# Patient Record
Sex: Male | Born: 1965 | Race: White | Hispanic: No | Marital: Single | Smoking: Former smoker
Health system: Southern US, Community
[De-identification: ages and names within clinical notes are randomized; demographics above are authoritative.]

## PROBLEM LIST (undated history)

## (undated) DIAGNOSIS — I351 Nonrheumatic aortic (valve) insufficiency: Secondary | ICD-10-CM

## (undated) DIAGNOSIS — F1020 Alcohol dependence, uncomplicated: Secondary | ICD-10-CM

## (undated) DIAGNOSIS — Z87891 Personal history of nicotine dependence: Secondary | ICD-10-CM

## (undated) DIAGNOSIS — I5040 Unspecified combined systolic (congestive) and diastolic (congestive) heart failure: Secondary | ICD-10-CM

## (undated) DIAGNOSIS — I10 Essential (primary) hypertension: Secondary | ICD-10-CM

## (undated) DIAGNOSIS — G4733 Obstructive sleep apnea (adult) (pediatric): Secondary | ICD-10-CM

## (undated) DIAGNOSIS — I472 Ventricular tachycardia, unspecified: Secondary | ICD-10-CM

## (undated) DIAGNOSIS — I4819 Other persistent atrial fibrillation: Secondary | ICD-10-CM

## (undated) HISTORY — DX: Alcohol dependence, uncomplicated: F10.20

## (undated) HISTORY — DX: Personal history of nicotine dependence: Z87.891

## (undated) HISTORY — DX: Other persistent atrial fibrillation: I48.19

## (undated) HISTORY — DX: Ventricular tachycardia, unspecified: I47.20

## (undated) HISTORY — DX: Morbid (severe) obesity due to excess calories: E66.01

## (undated) HISTORY — PX: MOUTH SURGERY: SHX715

## (undated) HISTORY — DX: Unspecified combined systolic (congestive) and diastolic (congestive) heart failure: I50.40

## (undated) HISTORY — DX: Thoracic aortic ectasia: I77.810

## (undated) HISTORY — DX: Nonrheumatic aortic (valve) insufficiency: I35.1

## (undated) HISTORY — DX: Obstructive sleep apnea (adult) (pediatric): G47.33

---
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
## 2005-09-24 ENCOUNTER — Ambulatory Visit: Payer: Self-pay | Admitting: Family Medicine

## 2005-10-12 ENCOUNTER — Ambulatory Visit: Payer: Self-pay | Admitting: Family Medicine

## 2006-01-08 ENCOUNTER — Ambulatory Visit: Payer: Self-pay | Admitting: Family Medicine

## 2006-01-20 ENCOUNTER — Ambulatory Visit: Payer: Self-pay | Admitting: Family Medicine

## 2006-03-22 ENCOUNTER — Ambulatory Visit: Payer: Self-pay | Admitting: Family Medicine

## 2006-06-18 ENCOUNTER — Ambulatory Visit: Payer: Self-pay | Admitting: Family Medicine

## 2006-07-20 DIAGNOSIS — I10 Essential (primary) hypertension: Secondary | ICD-10-CM | POA: Insufficient documentation

## 2006-09-30 ENCOUNTER — Encounter: Payer: Self-pay | Admitting: Family Medicine

## 2007-01-31 ENCOUNTER — Ambulatory Visit: Payer: Self-pay | Admitting: Family Medicine

## 2007-03-10 ENCOUNTER — Telehealth: Payer: Self-pay | Admitting: Family Medicine

## 2007-03-23 ENCOUNTER — Ambulatory Visit: Payer: Self-pay | Admitting: Family Medicine

## 2008-10-08 ENCOUNTER — Telehealth: Payer: Self-pay | Admitting: Family Medicine

## 2008-11-20 ENCOUNTER — Ambulatory Visit: Payer: Self-pay | Admitting: Family Medicine

## 2010-02-13 ENCOUNTER — Ambulatory Visit: Payer: Self-pay | Admitting: Family Medicine

## 2010-11-11 NOTE — Assessment & Plan Note (Signed)
Summary: HTN, Weight   Vital Signs:  Patient profile:   45 year old male Height:      69 inches Weight:      327 pounds BMI:     48.46 Pulse rate:   95 / minute BP sitting:   174 / 106  (left arm) Cuff size:   large  Vitals Entered By: Kathlene November (Feb 13, 2010 8:22 AM) CC: followup BP- been out of meds x2 days, Hypertension Management   Primary Care Provider:  Linford Arnold, C  CC:  followup BP- been out of meds x2 days and Hypertension Management.  History of Present Illness: followup BP- been out of meds x2 days. Weight is up about 40 lbs since January. No regular exercise. NOt interested in surgical options.    Hypertension History:      He denies headache, chest pain, palpitations, dyspnea with exertion, orthopnea, PND, peripheral edema, visual symptoms, neurologic problems, syncope, and side effects from treatment.  Still unhappy with his weight. .        Positive major cardiovascular risk factors include male age 65 years old or older, hyperlipidemia, hypertension, and family history for ischemic heart disease (males less than 42 years old).  Negative major cardiovascular risk factors include no history of diabetes and non-tobacco-user status.        Further assessment for target organ damage reveals no history of ASHD, cardiac end-organ damage (CHF/LVH), stroke/TIA, peripheral vascular disease, renal insufficiency, or hypertensive retinopathy.     Current Medications (verified): 1)  Micardis Hct 80-25 Mg Tabs (Telmisartan-Hctz) .... Take 1 Tablet By Mouth Once A Day  Allergies (verified): No Known Drug Allergies  Social History: Reviewed history from 07/20/2006 and no changes required. 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  Sinlge.  Lives with a friend.  Quit smoking 04/27/99.  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  NO regular exercise.  Physical Exam  General:  Well-developed,well-nourished,in no acute distress;  alert,appropriate and cooperative throughout examination Head:  Normocephalic and atraumatic without obvious abnormalities.  balding. Lungs:  Normal respiratory effort, chest expands symmetrically. Lungs are clear to auscultation, no crackles or wheezes. Heart:  Normal rate and regular rhythm. S1 and S2 normal without gallop, murmur, click, rub or other extra sounds. Skin:  no rashes.   Psych:  Cognition and judgment appear intact. Alert and cooperative with normal attention span and concentration. No apparent delusions, illusions, hallucinations   Impression & Recommendations:  Problem # 1:  HYPERTENSION, BENIGN SYSTEMIC (ICD-401.1) Will change medication for better BP control and for cost reasons.  Due for labs as well f/u for BP recheck in 2 months with Dr. Leonard Schwartz.  His updated medication list for this problem includes:    Tribenzor 40-5-25 Mg Tabs (Olmesartan-amlodipine-hctz) .[name]... Take 1 tablet by mouth once a day  Orders: T-Comprehensive Metabolic Panel [id number]) T-TSH [phone]) T-Lipid Profile ([id number])  Problem # 2:  MORBID OBESITY (ICD-278.01) Disucssed options. Encouraged him to consider working with Dr. Leonard Schwartz on a weight loss program or consider sseing a nutritionist.  He is also not actively exercising.    Complete Medication List: 1)  Tribenzor 40-5-25 Mg Tabs (Olmesartan-amlodipine-hctz) .... Take 1 tablet by mouth once a day  Hypertension Assessment/Plan:      The patient's hypertensive risk group is category B: At least one risk factor (excluding diabetes) with no target organ damage.  His calculated 10 year risk of coronary heart disease is  14 %.  Today's blood pressure is 174/106.  His blood pressure goal is < 140/90.  Patient Instructions: 1)  Change to Tirbenzor once a day.  2)  Please schedule a follow-up appointment in 2 months to recheck BP.  3)  Make sure to fast for 8 hour for your labwork.   Prescriptions: TRIBENZOR 40-5-25 MG TABS  (OLMESARTAN-AMLODIPINE-HCTZ) Take 1 tablet by mouth once a day  #30 x 3   Entered and Authorized by:   Nani Gasser MD   Signed by:   Nani Gasser MD on 02/13/2010   Method used:   Electronically to        [facility] [phone]* (retail)       [address] [place], [place]  [address]       Ph: [phone]       Fax: [phone]   RxID:   [id number]

## 2010-12-22 ENCOUNTER — Ambulatory Visit (INDEPENDENT_AMBULATORY_CARE_PROVIDER_SITE_OTHER): Payer: BC Managed Care – PPO | Admitting: Family Medicine

## 2010-12-22 ENCOUNTER — Encounter: Payer: Self-pay | Admitting: Family Medicine

## 2010-12-22 ENCOUNTER — Ambulatory Visit: Payer: Self-pay | Admitting: Family Medicine

## 2010-12-22 DIAGNOSIS — I10 Essential (primary) hypertension: Secondary | ICD-10-CM

## 2010-12-30 NOTE — Assessment & Plan Note (Signed)
Summary: HTN   Vital Signs:  Patient profile:   45 year old male Height:      69 inches Weight:      233 pounds Pulse rate:   73 / minute BP sitting:   147 / 84  (right arm) Cuff size:   large  Vitals Entered By: Avon Gully CMA, Duncan Dull) (December 22, 2010 1:57 PM) CC: f/u BP   Primary Care Provider:  Linford Arnold, C  CC:  f/u BP.  History of Present Illness: Hasn't had meds since Saturday.  BP  have been running 115/ 50s. He can feel when runs this low.  Can tell when working out [employer] cardio. He has been working out and has lost a sig amt of weight. Has lost 90 lbs since less than a year.  NO CP, SOB.    Current Medications (verified): 1)  Tribenzor 40-5-25 Mg Tabs (Olmesartan-Amlodipine-Hctz) .... Take 1 Tablet By Mouth Once A Day  Allergies (verified): No Known Drug Allergies  Comments:  Nurse/Medical Assistant: The patient's medications and allergies were reviewed with the patient and were updated in the Medication and Allergy Lists. Avon Gully CMA, Duncan Dull) (December 22, 2010 1:58 PM)  Physical Exam  General:  Well-developed,well-nourished,in no acute distress; alert,appropriate and cooperative throughout examination Lungs:  Normal respiratory effort, chest expands symmetrically. Lungs are clear to auscultation, no crackles or wheezes. Heart:  Normal rate and regular rhythm. S1 and S2 normal without gallop, murmur, click, rub or other extra sounds.   Impression & Recommendations:  Problem # 1:  HYPERTENSION, BENIGN SYSTEMIC (ICD-401.1) Much improved. Will d/c amlodipine, cut diuretic in half and change to losartan for cost reason F/U recheck in one month.  Due for fasting labs as well.  His updated medication list for this problem includes:    Losartan Potassium-hctz 100-12.5 Mg Tabs (Losartan potassium-hctz) .[name]... Take 1 tablet by mouth once a day  BP today: 147/84 Prior BP: 174/106 (02/13/2010)  Prior 10 Yr Risk Heart Disease: 14 % (02/13/2010)  Labs  Reviewed: Chol: 204 (10/09/2005)   HDL: 40 (10/09/2005)   LDL: 129 (10/09/2005)   TG: 175 (10/09/2005)  Orders: T-Comprehensive Metabolic Panel ([id number]) T-Lipid Profile ([id number])  Complete Medication List: 1)  Losartan Potassium-hctz 100-12.5 Mg Tabs (Losartan potassium-hctz) .... Take 1 tablet by mouth once a day  Patient Instructions: 1)  Please schedule a follow-up appointment in 1 month for blood pressure check.  jnPrescriptions: LOSARTAN POTASSIUM-HCTZ 100-12.5 MG TABS (LOSARTAN POTASSIUM-HCTZ) Take 1 tablet by mouth once a day  #30 x 1   Entered and Authorized by:   Nani Gasser MD   Signed by:   Nani Gasser MD on 12/22/2010   Method used:   Electronically to        [facility] [phone]* (retail)       [address] [place], [place]  [address]       Ph: [phone]       Fax: [phone]   RxID:   [id number]    Orders Added: 1)  T-Comprehensive Metabolic Panel [[id number]] 2)  T-Lipid Profile [[id number]] 3)  Est. Patient Level III [25956]

## 2011-01-13 ENCOUNTER — Encounter: Payer: Self-pay | Admitting: Family Medicine

## 2011-02-18 ENCOUNTER — Telehealth: Payer: Self-pay | Admitting: Family Medicine

## 2011-02-18 NOTE — Telephone Encounter (Signed)
Pt called to inquire about how many hours to fast for fasting labs tomorrow?? Plan:  Pt instructed to allow 8-12 hours for fasting cholesterol panel he is having drawn. Jarvis Newcomer, LPN Domingo Dimes

## 2011-02-19 ENCOUNTER — Other Ambulatory Visit: Payer: Self-pay | Admitting: Family Medicine

## 2011-02-20 ENCOUNTER — Telehealth: Payer: Self-pay | Admitting: Family Medicine

## 2011-02-20 LAB — LIPID PANEL
Cholesterol: 192 mg/dL (ref 0–200)
HDL: 44 mg/dL (ref >39)
Triglycerides: 65 mg/dL (ref <150)

## 2011-02-20 LAB — COMPREHENSIVE METABOLIC PANEL
Albumin: 4.7 g/dL (ref 3.5–5.2)
BUN: 20 mg/dL (ref 6–23)
Calcium: 9.9 mg/dL (ref 8.4–10.5)
Chloride: 101 mEq/L (ref 96–112)
Glucose, Bld: 66 mg/dL — ABNORMAL LOW (ref 70–99)
Potassium: 5 mEq/L (ref 3.5–5.3)

## 2011-02-20 NOTE — Telephone Encounter (Signed)
Call patient: Labs look okay except for LDL still up a little bit is 135. Please continue to work on exercise and diet and we can recheck in one year.

## 2011-02-21 ENCOUNTER — Encounter: Payer: Self-pay | Admitting: Family Medicine

## 2011-02-23 ENCOUNTER — Ambulatory Visit (INDEPENDENT_AMBULATORY_CARE_PROVIDER_SITE_OTHER): Payer: BC Managed Care – PPO | Admitting: Family Medicine

## 2011-02-23 ENCOUNTER — Encounter: Payer: Self-pay | Admitting: Family Medicine

## 2011-02-23 DIAGNOSIS — I10 Essential (primary) hypertension: Secondary | ICD-10-CM

## 2011-02-23 NOTE — Telephone Encounter (Signed)
Pt.notified

## 2011-02-23 NOTE — Assessment & Plan Note (Signed)
At goal.  Continue current regimen. F/U in 4 months since he is continuing to work on losing weight. Hopefully BPs will come down even more and we can reduce his medication.

## 2011-02-23 NOTE — Progress Notes (Signed)
  Subjective:    Patient ID: Joe Morrison, male    DOB: 09-22-1966, 45 y.o.   MRN: [id number]  HPI Home BPs 132/74 over the last 2 months.  We had dropped his amlodipine at the last visit. He is still working out regularly and has lost about another 16 lbs per our scales. He is doing cardio daily. Taking his meds reguarly.  Repeat BP here was 132/85.  No CP, SOB with exercise.  His goal is to be under 200 lb at next OV and to work to get off his BP meds.    Review of Systems     Objective:   Physical Exam  Constitutional: He is oriented to person, place, and time. He appears well-developed and well-nourished.  HENT:  Head: Normocephalic and atraumatic.  Eyes: Conjunctivae are normal. Pupils are equal, round, and reactive to light.  Neck: Neck supple. No thyromegaly present.  Cardiovascular: Normal rate, regular rhythm and normal heart sounds.   Pulmonary/Chest: Effort normal and breath sounds normal.  Lymphadenopathy:    He has no cervical adenopathy.  Neurological: He is alert and oriented to person, place, and time.  Skin: Skin is warm and dry.  Psychiatric: He has a normal mood and affect.          Assessment & Plan:

## 2011-04-14 ENCOUNTER — Encounter: Payer: Self-pay | Admitting: Family Medicine

## 2011-04-14 ENCOUNTER — Ambulatory Visit (INDEPENDENT_AMBULATORY_CARE_PROVIDER_SITE_OTHER): Payer: Commercial Managed Care - PPO | Admitting: Family Medicine

## 2011-04-14 VITALS — BP 144/88 | HR 93 | Ht 69.0 in | Wt 207.0 lb

## 2011-04-14 DIAGNOSIS — F5221 Male erectile disorder: Secondary | ICD-10-CM

## 2011-04-14 DIAGNOSIS — F528 Other sexual dysfunction not due to a substance or known physiological condition: Secondary | ICD-10-CM

## 2011-04-14 DIAGNOSIS — I10 Essential (primary) hypertension: Secondary | ICD-10-CM

## 2011-04-14 MED ORDER — LOSARTAN POTASSIUM-HCTZ 100-25 MG PO TABS: 1.0000 | ORAL_TABLET | Freq: Every day | ORAL | Status: DC

## 2011-04-14 MED ORDER — VARDENAFIL HCL 10 MG PO TABS: 10.0000 mg | ORAL_TABLET | ORAL | Status: DC | PRN

## 2011-04-14 NOTE — Patient Instructions (Addendum)
Can go online and see if a coupon on the levitra.  We are increasing your BP med. Recheck in 2-3 months.

## 2011-04-14 NOTE — Assessment & Plan Note (Signed)
BP up a little today so will inc diuretic.  F/u in 2-3 months. Congrats on wt loss.

## 2011-04-14 NOTE — Progress Notes (Signed)
  Subjective:    Patient ID: Joe Morrison, male    DOB: [birth year], 45 y.o.   MRN: [id number]  Hypertension This is a chronic problem. The current episode started more than 1 year ago. The problem is unchanged. The problem is controlled. Pertinent negatives include no chest pain or shortness of breath. There are no associated agents to hypertension. Past treatments include ACE inhibitors and diuretics. The current treatment provides mild improvement. There are no compliance problems.   Average 130/70 at home. Has lost 7 more lbs. He is still working out regularly.   Having ED problems.  Not sure if pyschological but says does wake up with erections.  Says does have anxiety about being undressed. He has a lot of body image and securities.  Now in a new relationship and may become sexual active.  He is concerned this may affect his new relationship. No worsening or alleviating symptoms. No history of prior problems with erectile dysfunction.   Review of Systems  Respiratory: Negative for shortness of breath.   Cardiovascular: Negative for chest pain.       Objective:   Physical Exam  Constitutional: He is oriented to person, place, and time. He appears well-developed and well-nourished.  HENT:  Head: Normocephalic and atraumatic.  Cardiovascular: Normal rate, regular rhythm and normal heart sounds.   Pulmonary/Chest: Effort normal and breath sounds normal.  Musculoskeletal: He exhibits no edema.  Neurological: He is alert and oriented to person, place, and time.  Skin: Skin is warm and dry.  Psychiatric: He has a normal mood and affect. His behavior is normal.          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  Completed questionnaire.  He scored 11 which is significant for a cognitive function. I also gave him a testosterone questionnaire and he only answered yes to 2 questions. Thus it is unlikely that he has low testosterone. Discussed trial of ED med to help boost his  confidence and then wean.  Has a prescription for Levitra. He can check on her coupon cards.

## 2011-04-21 ENCOUNTER — Other Ambulatory Visit: Payer: Self-pay | Admitting: Family Medicine

## 2011-04-21 NOTE — Telephone Encounter (Signed)
rx called to pharmacy 

## 2011-05-11 ENCOUNTER — Other Ambulatory Visit: Payer: Self-pay | Admitting: Family Medicine

## 2011-05-11 NOTE — Telephone Encounter (Signed)
Pt called and would like to get some samples of levitra. Plan:  Called and LMOM for the pt to return the call to triage nurse and needs to speak with her. Jarvis Newcomer, LPN [facility] Dimes

## 2011-05-11 NOTE — Telephone Encounter (Signed)
Pt's call returned at the number he requested to be called at, but had to [facility]  Told pt to call me back at his convenience. Jarvis Newcomer, LPN Domingo Dimes

## 2011-05-12 ENCOUNTER — Other Ambulatory Visit: Payer: Self-pay | Admitting: Family Medicine

## 2011-05-12 MED ORDER — VARDENAFIL HCL 10 MG PO TABS: 10.0000 mg | ORAL_TABLET | ORAL | Status: DC | PRN

## 2011-05-14 ENCOUNTER — Other Ambulatory Visit: Payer: Self-pay | Admitting: Family Medicine

## 2011-05-14 NOTE — Telephone Encounter (Signed)
No response from pt to this point.  Pt told to call back at his convenience.  Will close encounter for now and pt will call back when ready.  Samples were pulled and place up front and pt was handled on a diff mess encounter. Jarvis Newcomer, LPN Domingo Dimes

## 2011-05-14 NOTE — Telephone Encounter (Signed)
Closed encounter °

## 2011-05-26 ENCOUNTER — Other Ambulatory Visit: Payer: Self-pay | Admitting: Family Medicine

## 2011-06-09 ENCOUNTER — Telehealth: Payer: Self-pay | Admitting: Family Medicine

## 2011-06-09 MED ORDER — TADALAFIL 5 MG PO TABS: 5.0000 mg | ORAL_TABLET | Freq: Every day | ORAL | Status: DC | PRN

## 2011-06-09 NOTE — Telephone Encounter (Signed)
Pt called and said he would like to try the cialis all day coverage since he and his wife are sexually active at current.  His other script is only written for # 5 pills for 30 day supply and he is getting ready to fill the last refill of that.  Printed a coupon off line for cialis 5 mg all day coverage for 30 day free trial Will need a hard script to go with the coupon.  Please advise.   PLAN:  Routed to Dr. Marlyne Beards, LPN [facility] Dimes

## 2011-06-09 NOTE — Telephone Encounter (Signed)
Printed rx  

## 2011-06-09 NOTE — Telephone Encounter (Signed)
Pt informed that he could pup his script for cialis 5 mg for 30 day supply.  LMOM for his private cellular. Jarvis Newcomer, LPN Domingo Dimes

## 2011-06-29 ENCOUNTER — Telehealth: Payer: Self-pay | Admitting: Family Medicine

## 2011-06-29 NOTE — Telephone Encounter (Signed)
Pt called and said he had been using cialis and he had a coupon, but unfortunately for daily use it is not working as well as the levitra, but wants to know if the cialis comes in a higher dose besides the 5mg .  If there is not a higher dose of the cialis pt prefers to go back to using the levitra.  Please advise. Plan:  Routed to Dr.Metheney Jarvis Newcomer, LPN [facility] Dimes

## 2011-06-30 ENCOUNTER — Telehealth: Payer: Self-pay | Admitting: Family Medicine

## 2011-06-30 MED ORDER — VARDENAFIL HCL 20 MG PO TABS: 20.0000 mg | ORAL_TABLET | ORAL | Status: DC | PRN

## 2011-06-30 NOTE — Telephone Encounter (Signed)
Rx sent for levitra.

## 2011-06-30 NOTE — Telephone Encounter (Signed)
There is not a higher daily dose of the Cialis. Depending on how long he has tried it, he does need to take it for at least a week or 2 to get the full effect of the daily dosing. If he would like to go ahead and switch back to the Levitra we can. He will have to remind me what dose he was using on the Levitra.

## 2011-06-30 NOTE — Telephone Encounter (Signed)
Pt had tried the cialis for a full 3 weeks and waited before he called.  Wants to go back to using cialis, and he was last on the 20 mg.  Please fill more that 5 at a time because pt said he is mostly paying out of pocket anyway.  Correct pharm is loaded in the system. Plan:  Routed to Dr. Marlyne Beards, LPN [facility] Dimes

## 2011-07-01 NOTE — Telephone Encounter (Signed)
Closed

## 2011-08-19 ENCOUNTER — Other Ambulatory Visit: Payer: Self-pay | Admitting: Family Medicine

## 2011-09-23 ENCOUNTER — Other Ambulatory Visit: Payer: Self-pay | Admitting: Family Medicine

## 2011-09-23 MED ORDER — LOSARTAN POTASSIUM-HCTZ 100-12.5 MG PO TABS: 1.0000 | ORAL_TABLET | Freq: Every day | ORAL | Status: DC

## 2011-11-20 ENCOUNTER — Other Ambulatory Visit: Payer: Self-pay | Admitting: *Deleted

## 2011-11-20 MED ORDER — LOSARTAN POTASSIUM-HCTZ 100-25 MG PO TABS: 1.0000 | ORAL_TABLET | Freq: Every day | ORAL | Status: DC

## 2011-12-04 ENCOUNTER — Encounter: Payer: Self-pay | Admitting: *Deleted

## 2011-12-11 ENCOUNTER — Ambulatory Visit (INDEPENDENT_AMBULATORY_CARE_PROVIDER_SITE_OTHER): Payer: Commercial Managed Care - PPO | Admitting: Family Medicine

## 2011-12-11 ENCOUNTER — Encounter: Payer: Self-pay | Admitting: Family Medicine

## 2011-12-11 DIAGNOSIS — I10 Essential (primary) hypertension: Secondary | ICD-10-CM

## 2011-12-11 MED ORDER — LOSARTAN POTASSIUM-HCTZ 100-12.5 MG PO TABS: 1.0000 | ORAL_TABLET | Freq: Every day | ORAL | Status: DC

## 2011-12-11 NOTE — Progress Notes (Signed)
  Subjective:    Patient ID: Joe Morrison, male    DOB: Mar 07, 1966, 46 y.o.   MRN: [id number]  HPI HTN - Says gained weight over the holidays and hasn't been exercising and says  Has gottten back on trackt the last 3 weeks.  Tolerating it well. No SE. No CP or SOB.    Review of Systems     Objective:   Physical Exam  Constitutional: He is oriented to person, place, and time. He appears well-developed and well-nourished.  HENT:  Head: Normocephalic and atraumatic.  Cardiovascular: Normal rate, regular rhythm and normal heart sounds.   Pulmonary/Chest: Effort normal and breath sounds normal.  Neurological: He is alert and oriented to person, place, and time.  Skin: Skin is warm and dry.  Psychiatric: He has a normal mood and affect. His behavior is normal.          Assessment & Plan:  HTN- repeat BP is well controlled.  F/U in 4-6 months.   Obesity - He is getting back on track the last few weeks.

## 2012-03-21 ENCOUNTER — Encounter: Payer: Commercial Managed Care - PPO | Admitting: Family Medicine

## 2012-04-12 ENCOUNTER — Encounter: Payer: Commercial Managed Care - PPO | Admitting: Family Medicine

## 2012-04-22 LAB — LIPID PANEL
Cholesterol: 186 mg/dL (ref 0–200)
Triglycerides: 73 mg/dL (ref 40–160)

## 2012-05-02 ENCOUNTER — Ambulatory Visit (INDEPENDENT_AMBULATORY_CARE_PROVIDER_SITE_OTHER): Payer: Commercial Managed Care - PPO | Admitting: Family Medicine

## 2012-05-02 ENCOUNTER — Telehealth: Payer: Self-pay | Admitting: Family Medicine

## 2012-05-02 ENCOUNTER — Encounter: Payer: Self-pay | Admitting: Family Medicine

## 2012-05-02 VITALS — BP 131/76 | HR 83 | Ht 68.0 in | Wt 215.0 lb

## 2012-05-02 DIAGNOSIS — Z0000 Encounter for general adult medical examination without abnormal findings: Secondary | ICD-10-CM

## 2012-05-02 DIAGNOSIS — I10 Essential (primary) hypertension: Secondary | ICD-10-CM

## 2012-05-02 DIAGNOSIS — N529 Male erectile dysfunction, unspecified: Secondary | ICD-10-CM

## 2012-05-02 MED ORDER — VARDENAFIL HCL 20 MG PO TABS: 20.0000 mg | ORAL_TABLET | ORAL | Status: DC | PRN

## 2012-05-02 NOTE — Telephone Encounter (Signed)
Call pt: I did receive the fax copy of his blood work. Overall cholesterol looks good. His LDL is still slightly elevated. Normal is under 100. His was 125 but is better than it was last year. Keep up the good work and we can recheck in one year. I do need to get a complete metabolic panel with liver and kidney function as this was not included. I will print a  lab slip for CMP and he can go anytime. He does not need to fast for this.

## 2012-05-02 NOTE — Patient Instructions (Addendum)
Start a regular exercise program and make sure you are eating a healthy diet Try to eat 4 servings of dairy  Your vaccines are up to date.  Please fax me a copy of your lab work [phone].

## 2012-05-02 NOTE — Telephone Encounter (Signed)
Pt informed and will go today.

## 2012-05-02 NOTE — Progress Notes (Signed)
Subjective:    Patient ID: Joe Morrison, male    DOB: 1966/05/12, 46 y.o.   MRN: [id number]  HPI Here for CPE. He recently had a biometrics screening at work and they had blood work done there. He reports his cholesterol looks fantastic. He says they will be happy to fax [place] a copy.   Review of Systems comprehenive ROS is negative  BP 131/76  Pulse 83  Ht 5\' 8"  (1.727 m)  Wt 215 lb (97.523 kg)  BMI 32.69 kg/m2  SpO2 99%    No Known Allergies  Past Medical History  Diagnosis Date  . Alcoholic     recovering since 12/863    Past Surgical History  Procedure Date  . Mouth surgery     Dental work  . Tonsillectomy 1972    History   Social History  . Marital Status: Single    Spouse Name: N/A    Number of Children: 0  . Years of Education: N/A   Occupational History  . Not on file.   Social History Main Topics  . Smoking status: Former Smoker    Quit date: 04/27/1999  . Smokeless tobacco: Not on file  . Alcohol Use: No     Former Advice worker in [place]  . Drug Use: No  . Sexually Active: Not on file   Other Topics Concern  . Not on file   Social History Narrative   Working out daily with a Psychologist, educational. No partner    Family History  Problem Relation Age of Onset  . Diabetes Mother   . Hypertension Mother   . Heart attack Father 67  . Alcohol abuse Other     Outpatient Encounter Prescriptions as of 05/02/2012  Medication Sig Dispense Refill  . losartan-hydrochlorothiazide (HYZAAR) 100-12.5 MG per tablet Take 1 tablet by mouth daily.  30 tablet  6  . vardenafil (LEVITRA) 20 MG tablet Take 1 tablet (20 mg total) by mouth as needed for erectile dysfunction.  10 tablet  4          Objective:   Physical Exam  Constitutional: He is oriented to person, place, and time. He appears well-developed and well-nourished.       He is obese.   HENT:  Head: Normocephalic and atraumatic.  Right Ear: External ear normal.  Left Ear: External ear normal.  Nose:  Nose normal.  Mouth/Throat: Oropharynx is clear and moist.  Eyes: Conjunctivae and EOM are normal. Pupils are equal, round, and reactive to light.  Neck: Normal range of motion. Neck supple. No thyromegaly present.  Cardiovascular: Normal rate, regular rhythm, normal heart sounds and intact distal pulses.   Pulmonary/Chest: Effort normal and breath sounds normal.  Abdominal: Soft. Bowel sounds are normal. He exhibits no distension and no mass. There is no tenderness. There is no rebound and no guarding.  Musculoskeletal: Normal range of motion.  Lymphadenopathy:    He has no cervical adenopathy.  Neurological: He is alert and oriented to person, place, and time. He has normal reflexes.  Skin: Skin is warm and dry.  Psychiatric: He has a normal mood and affect. His behavior is normal. Judgment and thought content normal.          Assessment & Plan:  CPE-  Start a regular exercise program and make sure you are eating a healthy diet Try to eat 4 servings of dairy a day  Your vaccines are up to date.   HTN- doing veyr well. No CP or SOB.  F/U in 6 months  ED- REfills sent to pharmacy.

## 2012-05-03 ENCOUNTER — Encounter: Payer: Self-pay | Admitting: *Deleted

## 2012-07-05 ENCOUNTER — Other Ambulatory Visit: Payer: Self-pay | Admitting: Family Medicine

## 2012-08-31 ENCOUNTER — Other Ambulatory Visit: Payer: Self-pay | Admitting: Family Medicine

## 2012-11-01 ENCOUNTER — Ambulatory Visit (INDEPENDENT_AMBULATORY_CARE_PROVIDER_SITE_OTHER): Payer: Commercial Managed Care - PPO | Admitting: Family Medicine

## 2012-11-01 ENCOUNTER — Encounter: Payer: Self-pay | Admitting: Family Medicine

## 2012-11-01 VITALS — BP 136/82 | HR 86 | Resp 18 | Wt 216.0 lb

## 2012-11-01 DIAGNOSIS — I10 Essential (primary) hypertension: Secondary | ICD-10-CM

## 2012-11-01 MED ORDER — LOSARTAN POTASSIUM-HCTZ 100-12.5 MG PO TABS: 1.0000 | ORAL_TABLET | Freq: Every day | ORAL | Status: DC

## 2012-11-01 NOTE — Progress Notes (Signed)
  Subjective:    Patient ID: Joe Morrison, male    DOB: 1966-06-03, 47 y.o.   MRN: [id number]  HPI HTN - Home BPs runnin 115-130.   Pt denies chest pain, SOB, dizziness, or heart palpitations.  Taking meds as directed w/o problems.  Denies medication side effects.  Still working out regularly.     Review of Systems     Objective:   Physical Exam  Constitutional: He is oriented to person, place, and time. He appears well-developed and well-nourished.  HENT:  Head: Normocephalic and atraumatic.  Cardiovascular: Normal rate, regular rhythm and normal heart sounds.   Pulmonary/Chest: Effort normal and breath sounds normal.  Neurological: He is alert and oriented to person, place, and time.  Skin: Skin is warm and dry.  Psychiatric: He has a normal mood and affect. His behavior is normal.          Assessment & Plan:  HTN- Well controlled. Due for CMP.  Home BPS sounds fantastic. F/u in 6 mo.

## 2012-11-02 LAB — COMPLETE METABOLIC PANEL WITH GFR
Albumin: 4.3 g/dL (ref 3.5–5.2)
Alkaline Phosphatase: 31 U/L — ABNORMAL LOW (ref 39–117)
BUN: 11 mg/dL (ref 6–23)
GFR, Est Non African American: 83 mL/min
Glucose, Bld: 80 mg/dL (ref 70–99)
Potassium: 4.4 mEq/L (ref 3.5–5.3)
Total Bilirubin: 0.8 mg/dL (ref 0.3–1.2)

## 2012-11-02 NOTE — Progress Notes (Signed)
Quick Note:  All labs are normal. ______ 

## 2013-05-01 ENCOUNTER — Ambulatory Visit: Payer: Commercial Managed Care - PPO | Admitting: Family Medicine

## 2013-05-01 DIAGNOSIS — Z0289 Encounter for other administrative examinations: Secondary | ICD-10-CM

## 2013-05-20 ENCOUNTER — Other Ambulatory Visit: Payer: Self-pay | Admitting: Family Medicine

## 2013-06-02 ENCOUNTER — Other Ambulatory Visit: Payer: Self-pay | Admitting: Family Medicine

## 2013-07-07 ENCOUNTER — Other Ambulatory Visit: Payer: Self-pay

## 2013-07-07 MED ORDER — LOSARTAN POTASSIUM-HCTZ 100-12.5 MG PO TABS: 1.0000 | ORAL_TABLET | Freq: Every day | ORAL | Status: DC

## 2013-07-07 NOTE — Telephone Encounter (Signed)
Refilled blood pressure medication and scheduled patient to come in for visit.

## 2013-07-12 ENCOUNTER — Ambulatory Visit: Payer: Commercial Managed Care - PPO | Admitting: Family Medicine

## 2013-08-02 ENCOUNTER — Ambulatory Visit: Payer: Commercial Managed Care - PPO | Admitting: Family Medicine

## 2013-08-04 ENCOUNTER — Other Ambulatory Visit: Payer: Self-pay

## 2013-08-04 MED ORDER — LOSARTAN POTASSIUM-HCTZ 100-12.5 MG PO TABS: 1.0000 | ORAL_TABLET | Freq: Every day | ORAL | Status: DC

## 2013-08-11 ENCOUNTER — Ambulatory Visit: Payer: Commercial Managed Care - PPO | Admitting: Family Medicine

## 2013-08-23 ENCOUNTER — Encounter: Payer: Commercial Managed Care - PPO | Admitting: Family Medicine

## 2013-08-23 DIAGNOSIS — Z0289 Encounter for other administrative examinations: Secondary | ICD-10-CM

## 2013-08-24 NOTE — Progress Notes (Signed)
  Subjective:    Patient ID: Joe Morrison, male    DOB: 11-22-1965, 47 y.o.   MRN: [id number]  HPI Patient no showed for appointment.   Review of Systems     Objective:   Physical Exam        Assessment & Plan:

## 2013-09-13 ENCOUNTER — Telehealth: Payer: Self-pay | Admitting: Family Medicine

## 2013-09-13 ENCOUNTER — Other Ambulatory Visit: Payer: Self-pay | Admitting: Family Medicine

## 2013-09-13 MED ORDER — LOSARTAN POTASSIUM-HCTZ 100-12.5 MG PO TABS: ORAL_TABLET | ORAL | Status: DC

## 2013-09-13 NOTE — Telephone Encounter (Signed)
Patient called and request to have bp med refilled he took his last pill today and called Monday 09/11/13 he has an appointment this Friday 09/15/13. Thanks

## 2013-09-15 ENCOUNTER — Ambulatory Visit: Payer: Commercial Managed Care - PPO | Admitting: Family Medicine

## 2013-09-15 DIAGNOSIS — Z0289 Encounter for other administrative examinations: Secondary | ICD-10-CM

## 2013-10-13 ENCOUNTER — Other Ambulatory Visit: Payer: Self-pay | Admitting: *Deleted

## 2013-10-13 MED ORDER — LOSARTAN POTASSIUM-HCTZ 100-12.5 MG PO TABS: ORAL_TABLET | ORAL | Status: DC

## 2013-10-13 NOTE — Telephone Encounter (Signed)
Pt informed we are sending 2 weeks worth of BP medication to pharmacy. Has appt scheduled for 10/16/13.  Meyer CoryMisty Ahmad, LPN

## 2013-10-16 ENCOUNTER — Ambulatory Visit: Payer: Commercial Managed Care - PPO | Admitting: Family Medicine

## 2013-10-20 ENCOUNTER — Ambulatory Visit: Payer: Commercial Managed Care - PPO | Admitting: Family Medicine

## 2013-10-23 ENCOUNTER — Ambulatory Visit (INDEPENDENT_AMBULATORY_CARE_PROVIDER_SITE_OTHER): Payer: Commercial Managed Care - PPO | Admitting: Family Medicine

## 2013-10-23 ENCOUNTER — Encounter: Payer: Self-pay | Admitting: Family Medicine

## 2013-10-23 VITALS — BP 185/102 | HR 110 | Temp 98.0°F | Ht 69.0 in | Wt 298.0 lb

## 2013-10-23 DIAGNOSIS — Z6841 Body Mass Index (BMI) 40.0 and over, adult: Secondary | ICD-10-CM

## 2013-10-23 DIAGNOSIS — I10 Essential (primary) hypertension: Secondary | ICD-10-CM

## 2013-10-23 MED ORDER — LOSARTAN POTASSIUM-HCTZ 100-12.5 MG PO TABS: ORAL_TABLET | ORAL | Status: DC

## 2013-10-23 NOTE — Progress Notes (Signed)
   Subjective:    Patient ID: Joe Morrison, male    DOB: 02/25/1966, 48 y.o.   MRN: [id number]  HPI Hypertension- Pt denies chest pain, SOB, dizziness, or heart palpitations.  Taking meds as directed w/o problems.  Denies medication side effects.  Ran out of meds last Monday.  Hasn't been working out. Has fallen off hid diet and exercise.  Home BPs running 130s.     Review of Systems     Objective:   Physical Exam  Constitutional: He is oriented to person, place, and time. He appears well-developed and well-nourished.  HENT:  Head: Normocephalic and atraumatic.  Cardiovascular: Normal rate, regular rhythm and normal heart sounds.   Pulmonary/Chest: Effort normal and breath sounds normal.  Neurological: He is alert and oriented to person, place, and time.  Skin: Skin is warm and dry.  Psychiatric: He has a normal mood and affect. His behavior is normal.          Assessment & Plan:  HTN- Uncontrolled off of meds. Restart BP meds. F/U in 1-2 months.    Obesity/BMI 44 - continue to work on diet and exercise.  Discussed options.  Offered to refer to Dr. Seymour BarsKaren Bowen or consider phentermine when BP under control for appetitie supression.

## 2013-12-04 ENCOUNTER — Ambulatory Visit: Payer: Commercial Managed Care - PPO | Admitting: Family Medicine

## 2013-12-18 ENCOUNTER — Ambulatory Visit: Payer: Commercial Managed Care - PPO | Admitting: Family Medicine

## 2013-12-26 ENCOUNTER — Ambulatory Visit: Payer: Commercial Managed Care - PPO | Admitting: Family Medicine

## 2013-12-26 DIAGNOSIS — Z0289 Encounter for other administrative examinations: Secondary | ICD-10-CM

## 2014-07-26 ENCOUNTER — Other Ambulatory Visit: Payer: Self-pay | Admitting: Family Medicine

## 2014-08-24 ENCOUNTER — Other Ambulatory Visit: Payer: Self-pay | Admitting: Family Medicine

## 2014-08-24 NOTE — Telephone Encounter (Signed)
Spoke with pt and he was told that he would need a f/u appt. He stated that he thinks that he may have some time next week however could not make the appt right now because he was driving but he would call back on Monday to schedule an appt. I stressed to him that it is imperative for him to make and keep this appt due to needing to have labwork done. I told him that I would send in his medication but this would NOT be refilled again until he is seen . He voiced understanding and agreed.Heath GoldBarkley, [place] Dulay Lynetta'

## 2014-08-26 ENCOUNTER — Other Ambulatory Visit: Payer: Self-pay | Admitting: Family Medicine

## 2014-09-26 ENCOUNTER — Encounter: Payer: Self-pay | Admitting: Family Medicine

## 2014-09-26 ENCOUNTER — Ambulatory Visit (INDEPENDENT_AMBULATORY_CARE_PROVIDER_SITE_OTHER): Payer: Commercial Managed Care - PPO | Admitting: Family Medicine

## 2014-09-26 VITALS — BP 166/89 | HR 109 | Temp 97.4°F | Ht 69.0 in | Wt 306.0 lb

## 2014-09-26 DIAGNOSIS — Z125 Encounter for screening for malignant neoplasm of prostate: Secondary | ICD-10-CM

## 2014-09-26 DIAGNOSIS — I10 Essential (primary) hypertension: Secondary | ICD-10-CM

## 2014-09-26 DIAGNOSIS — Z13220 Encounter for screening for lipoid disorders: Secondary | ICD-10-CM

## 2014-09-26 DIAGNOSIS — Z1211 Encounter for screening for malignant neoplasm of colon: Secondary | ICD-10-CM

## 2014-09-26 MED ORDER — LOSARTAN POTASSIUM-HCTZ 100-12.5 MG PO TABS: 1.0000 | ORAL_TABLET | Freq: Every day | ORAL | Status: DC

## 2014-09-26 NOTE — Progress Notes (Signed)
   Subjective:    Patient ID: Joe Morrison, male    DOB: August 25, 1966, 48 y.o.   MRN: [id number]  HPI Follow-up hypertension-he was last seen in January of this year. He had been off of his blood pressure medications. We restarted them. He was post follow-up in 1-2 months and we can make sure his blood pressure looks good and that we do need to adjust medication. He is now following up. He was only given 6 months with the medication at that time. He denies any chest pain, short of breath or palpitations today. Home BPs running in the 12-135/80-90 range.  Has gained some weight.    Obesity-we also discussed referring him to Dr. Sharol RousselKaren Bowen's office for bariatric evaluation and treatment. He says he's really not interested in this time. Also discussed medications as a possibility and again he says he just feels like he needs to get back on track. He's been very successful his job but has been working very long hours and not really taking time to work out exercise and eat properly.   Review of Systems     Objective:   Physical Exam  Constitutional: He is oriented to person, place, and time. He appears well-developed and well-nourished.  HENT:  Head: Normocephalic and atraumatic.  Cardiovascular: Normal rate, regular rhythm and normal heart sounds.   Pulmonary/Chest: Effort normal and breath sounds normal.  Neurological: He is alert and oriented to person, place, and time.  Skin: Skin is warm and dry.  Psychiatric: He has a normal mood and affect. His behavior is normal.          Assessment & Plan:  Hypertension-  Uncontrolled.  He's been out of his medication for about 3 days at this point. We'll go ahead and refill the medication. Did ask him to come back in a couple months to recheck his pressure. It does sound like his home blood pressures have been well controlled. He is due for blood work. Lab slip provided today. We'll also fax over some labs that he had done at work. Next  Severe  obesity/BMI greater than 40-discussed options including referral to bariatric center and medication but he really wants to do diet and exercise on his own. Next  Discussed need for colonoscopy. He is okay with referral. Replaced today.

## 2014-12-03 ENCOUNTER — Ambulatory Visit: Payer: Commercial Managed Care - PPO | Admitting: Family Medicine

## 2014-12-13 ENCOUNTER — Ambulatory Visit: Payer: Commercial Managed Care - PPO | Admitting: Family Medicine

## 2014-12-26 ENCOUNTER — Ambulatory Visit: Payer: Commercial Managed Care - PPO | Admitting: Family Medicine

## 2014-12-27 ENCOUNTER — Ambulatory Visit: Payer: Commercial Managed Care - PPO | Admitting: Family Medicine

## 2015-04-17 ENCOUNTER — Other Ambulatory Visit: Payer: Self-pay | Admitting: Family Medicine

## 2015-05-24 ENCOUNTER — Other Ambulatory Visit: Payer: Self-pay | Admitting: Family Medicine

## 2015-05-24 ENCOUNTER — Other Ambulatory Visit: Payer: Self-pay | Admitting: *Deleted

## 2015-05-24 ENCOUNTER — Telehealth: Payer: Self-pay | Admitting: *Deleted

## 2015-05-24 DIAGNOSIS — I10 Essential (primary) hypertension: Secondary | ICD-10-CM

## 2015-05-24 MED ORDER — LOSARTAN POTASSIUM-HCTZ 100-12.5 MG PO TABS: 1.0000 | ORAL_TABLET | Freq: Every day | ORAL | Status: DC

## 2015-05-24 NOTE — Telephone Encounter (Signed)
Pt called requesting refill for bp meds.advised that he will need a f/u appt to get refills. Pt voiced understanding and agreed. Transferred to scheduling to make appt. rx for bp med sent.Joe KitchenMarland KitchenLoralee Pacas Jackson

## 2015-06-18 ENCOUNTER — Encounter: Payer: Self-pay | Admitting: Family Medicine

## 2015-06-18 ENCOUNTER — Ambulatory Visit (INDEPENDENT_AMBULATORY_CARE_PROVIDER_SITE_OTHER): Payer: Commercial Managed Care - PPO | Admitting: Family Medicine

## 2015-06-18 VITALS — BP 138/100 | HR 98 | Ht 69.0 in | Wt 287.0 lb

## 2015-06-18 DIAGNOSIS — I10 Essential (primary) hypertension: Secondary | ICD-10-CM

## 2015-06-18 MED ORDER — LOSARTAN POTASSIUM-HCTZ 100-12.5 MG PO TABS: 1.0000 | ORAL_TABLET | Freq: Every day | ORAL | Status: DC

## 2015-06-18 MED ORDER — AMLODIPINE BESYLATE 5 MG PO TABS: 5.0000 mg | ORAL_TABLET | Freq: Every day | ORAL | Status: DC

## 2015-06-18 NOTE — Progress Notes (Signed)
   Subjective:    Patient ID: Joe Morrison, male    DOB: [birth year], 49 y.o.   MRN: [id number]  HPI Hypertension- Pt denies chest pain, SOB, dizziness, or heart palpitations.  Taking meds as directed w/o problems.  Denies medication side effects.  BP has been runing a littl higher for the last 1.5 months.  Says his activity level has decreased. Hasn't been working out at much.   Review of Systems     Objective:   Physical Exam  Constitutional: He is oriented to person, place, and time. He appears well-developed and well-nourished.  HENT:  Head: Normocephalic and atraumatic.  Cardiovascular: Normal rate, regular rhythm and normal heart sounds.   Pulmonary/Chest: Effort normal and breath sounds normal.  Neurological: He is alert and oriented to person, place, and time.  Skin: Skin is warm and dry.  Psychiatric: He has a normal mood and affect. His behavior is normal.          Assessment & Plan:  HTN - Uncontrolled. Add amlodpine  ot regimen to imporove his diastolic pressure. He took it years ago and did well with it.  F/u in 2-3 months.    Severe obesity, BMI > 40. Discussed getting back on track with diet and exercise. He has already  Lost 20 lbs since I last saw him.

## 2015-06-19 LAB — HEPATIC FUNCTION PANEL
ALK PHOS: 42 U/L (ref 25–125)
ALT: 18 U/L (ref 10–40)
AST: 19 U/L (ref 14–40)
Bilirubin, Total: 0.6 mg/dL

## 2015-06-19 LAB — BASIC METABOLIC PANEL
BUN: 15 mg/dL (ref 4–21)
CREATININE: 1 mg/dL (ref 0.6–1.3)
Glucose: 94 mg/dL
POTASSIUM: 4.6 mmol/L (ref 3.4–5.3)
Sodium: 140 mmol/L (ref 137–147)

## 2015-06-19 LAB — LIPID PANEL
CHOLESTEROL: 209 mg/dL — AB (ref 0–200)
HDL: 40 mg/dL (ref 35–70)
LDL Cholesterol: 135 mg/dL
TRIGLYCERIDES: 171 mg/dL — AB (ref 40–160)

## 2015-06-19 LAB — PSA: PSA: 0.67

## 2015-08-19 ENCOUNTER — Ambulatory Visit (INDEPENDENT_AMBULATORY_CARE_PROVIDER_SITE_OTHER): Payer: Self-pay | Admitting: Family Medicine

## 2015-08-19 DIAGNOSIS — Z0289 Encounter for other administrative examinations: Secondary | ICD-10-CM

## 2015-08-19 NOTE — Progress Notes (Signed)
   Subjective:    Patient ID: Joe Morrison, male    DOB: [birth year], 49 y.o.   MRN: [id number]  HPI    Review of Systems     Objective:   Physical Exam        Assessment & Plan:  No Show for Appt

## 2015-12-14 ENCOUNTER — Other Ambulatory Visit: Payer: Self-pay | Admitting: Family Medicine

## 2015-12-17 ENCOUNTER — Telehealth: Payer: Self-pay | Admitting: Family Medicine

## 2015-12-17 NOTE — Telephone Encounter (Signed)
I called pt and scheduled pt for 01-09-16 with Dr. Linford ArnoldMetheney for BP f/u

## 2016-01-09 ENCOUNTER — Ambulatory Visit: Payer: Commercial Managed Care - PPO | Admitting: Family Medicine

## 2016-01-12 ENCOUNTER — Other Ambulatory Visit: Payer: Self-pay | Admitting: Family Medicine

## 2016-01-17 ENCOUNTER — Ambulatory Visit: Payer: Commercial Managed Care - PPO | Admitting: Family Medicine

## 2016-02-13 ENCOUNTER — Telehealth: Payer: Self-pay | Admitting: Family Medicine

## 2016-02-13 MED ORDER — LOSARTAN POTASSIUM-HCTZ 100-12.5 MG PO TABS: 1.0000 | ORAL_TABLET | Freq: Every day | ORAL | Status: DC

## 2016-02-13 NOTE — Telephone Encounter (Signed)
Patient called and scheduled an appt for 02/25/16 Dr. Norman HerrlichMetheneys next available but will be out of his medication by then losartan. Thanks

## 2016-02-13 NOTE — Telephone Encounter (Signed)
Losartan 100/12.5mg  #15 sent to patient pharmacy. Patient advised to keep appointment on 02/25/16. Hosanna Betley,CMA

## 2016-02-25 ENCOUNTER — Encounter: Payer: Self-pay | Admitting: Family Medicine

## 2016-02-25 ENCOUNTER — Ambulatory Visit (INDEPENDENT_AMBULATORY_CARE_PROVIDER_SITE_OTHER): Payer: Commercial Managed Care - PPO | Admitting: Family Medicine

## 2016-02-25 VITALS — BP 142/78 | HR 90 | Wt 324.0 lb

## 2016-02-25 DIAGNOSIS — I10 Essential (primary) hypertension: Secondary | ICD-10-CM

## 2016-02-25 DIAGNOSIS — Z125 Encounter for screening for malignant neoplasm of prostate: Secondary | ICD-10-CM

## 2016-02-25 DIAGNOSIS — Z1159 Encounter for screening for other viral diseases: Secondary | ICD-10-CM

## 2016-02-25 DIAGNOSIS — R0602 Shortness of breath: Secondary | ICD-10-CM

## 2016-02-25 DIAGNOSIS — Z114 Encounter for screening for human immunodeficiency virus [HIV]: Secondary | ICD-10-CM | POA: Diagnosis not present

## 2016-02-25 MED ORDER — LOSARTAN POTASSIUM-HCTZ 100-12.5 MG PO TABS: 1.0000 | ORAL_TABLET | Freq: Every day | ORAL | Status: DC

## 2016-02-25 MED ORDER — AMLODIPINE BESYLATE 5 MG PO TABS: 5.0000 mg | ORAL_TABLET | Freq: Every day | ORAL | Status: DC

## 2016-02-25 NOTE — Progress Notes (Signed)
   Subjective:    Patient ID: Joe Morrison, male    DOB: 1966/07/23, 50 y.o.   MRN: [id number]  HPI Hypertension- Pt denies chest pain, SOB, dizziness, or heart palpitations.  Taking meds as directed w/o problems.  Denies medication side effects.  He actually ran out of his amlodipine about 2 months ago but tolerated it well up until that point without any side effects or problems. Next  He also admits he's been very inactive over the last 5-6 months. He said a couple weeks ago he noticed he was getting a little bit short of breath with just basic activity and started to step up his exercise routine and feels like it has improved on its own. He denies any chest pain or lower stringy edema.  He did have blood work done in September and would like to go over those results today.  Review of Systems     Objective:   Physical Exam  Constitutional: He is oriented to person, place, and time. He appears well-developed and well-nourished.  HENT:  Head: Normocephalic and atraumatic.  Neck: Neck supple. No thyromegaly present.  Cardiovascular: Normal rate, regular rhythm and normal heart sounds.   Pulmonary/Chest: Effort normal and breath sounds normal.  Lymphadenopathy:    He has no cervical adenopathy.  Neurological: He is alert and oriented to person, place, and time.  Skin: Skin is warm and dry.  Psychiatric: He has a normal mood and affect. His behavior is normal.          Assessment & Plan:  HTN- Let's restart amlodipine.  New Prescription sent to the pharmacy.  Follow-up in 6 months.  Due for BMP.   Shortness of breath-sounds like it was decreased activity and lack of endurance but I did encourage him to keep an eye on it and let me know if it suddenly recurs or if it is more persistent. Right now I don't see anything worrisome on exam for pulmonary embolism etc.

## 2016-03-06 ENCOUNTER — Encounter: Payer: Self-pay | Admitting: Family Medicine

## 2016-08-23 ENCOUNTER — Other Ambulatory Visit: Payer: Self-pay | Admitting: Family Medicine

## 2016-08-26 ENCOUNTER — Other Ambulatory Visit: Payer: Self-pay

## 2016-08-26 ENCOUNTER — Other Ambulatory Visit: Payer: Self-pay | Admitting: Family Medicine

## 2016-08-26 MED ORDER — LOSARTAN POTASSIUM-HCTZ 100-12.5 MG PO TABS: 1.0000 | ORAL_TABLET | Freq: Every day | ORAL | 0 refills | Status: DC

## 2016-08-26 MED ORDER — AMLODIPINE BESYLATE 5 MG PO TABS: 5.0000 mg | ORAL_TABLET | Freq: Every day | ORAL | 0 refills | Status: DC

## 2016-08-27 ENCOUNTER — Ambulatory Visit: Payer: Commercial Managed Care - PPO | Admitting: Family Medicine

## 2016-09-11 ENCOUNTER — Ambulatory Visit: Payer: Commercial Managed Care - PPO | Admitting: Family Medicine

## 2016-09-24 ENCOUNTER — Ambulatory Visit: Payer: Commercial Managed Care - PPO | Admitting: Family Medicine

## 2016-09-28 ENCOUNTER — Encounter: Payer: Self-pay | Admitting: Family Medicine

## 2016-09-28 ENCOUNTER — Ambulatory Visit (INDEPENDENT_AMBULATORY_CARE_PROVIDER_SITE_OTHER): Payer: Commercial Managed Care - PPO | Admitting: Family Medicine

## 2016-09-28 VITALS — BP 152/82 | HR 91 | Resp 98 | Wt 341.0 lb

## 2016-09-28 DIAGNOSIS — I10 Essential (primary) hypertension: Secondary | ICD-10-CM

## 2016-09-28 DIAGNOSIS — Z125 Encounter for screening for malignant neoplasm of prostate: Secondary | ICD-10-CM

## 2016-09-28 MED ORDER — AMLODIPINE BESYLATE 5 MG PO TABS: 5.0000 mg | ORAL_TABLET | Freq: Every day | ORAL | 1 refills | Status: DC

## 2016-09-28 MED ORDER — LOSARTAN POTASSIUM-HCTZ 100-12.5 MG PO TABS: 1.0000 | ORAL_TABLET | Freq: Every day | ORAL | 1 refills | Status: DC

## 2016-09-28 NOTE — Progress Notes (Signed)
Subjective:    CC:   HPI: Hypertension- Pt denies chest pain, SOB, dizziness, or heart palpitations.  Taking meds as directed w/o problems.  Denies medication side effects.  He said he's refills today. He has not gone for blood work this year. He has not gone for blood work this year.  He reports that his tetanus is up-to-date.   Past medical history, Surgical history, Family history not pertinant except as noted below, Social history, Allergies, and medications have been entered into the medical record, reviewed, and corrections made.   Review of Systems: No fevers, chills, night sweats, weight loss, chest pain, or shortness of breath.   Objective:    General: Well Developed, well nourished, and in no acute distress.  Neuro: Alert and oriented x3, extra-ocular muscles intact, sensation grossly intact.  HEENT: Normocephalic, atraumatic  Skin: Warm and dry, no rashes. Cardiac: Regular rate and rhythm, no murmurs rubs or gallops, no lower extremity edema.  Respiratory: Clear to auscultation bilaterally. Not using accessory muscles, speaking in full sentences.   Impression and Recommendations:    HTN - Repeat blood pressure better but still elevated. Did refill medications today. Asked him to come back in 2 weeks for blood pressure check. Due for CMP and lipid panel.  Prostate cancer screening-due for PSA.  Discussed need for colon cancer screening.  He wants to check to see if his insurance will cover the coloGuard in January.  Obesity-he has gained back about 20 pounds that he had lost over last year. Just encourage him to be active and work on [employer] diet

## 2016-10-06 ENCOUNTER — Encounter: Payer: Self-pay | Admitting: Family Medicine

## 2017-03-21 ENCOUNTER — Other Ambulatory Visit: Payer: Self-pay | Admitting: Family Medicine

## 2017-03-29 ENCOUNTER — Ambulatory Visit: Payer: Commercial Managed Care - PPO | Admitting: Family Medicine

## 2017-04-05 ENCOUNTER — Ambulatory Visit: Payer: Commercial Managed Care - PPO | Admitting: Family Medicine

## 2017-04-13 ENCOUNTER — Ambulatory Visit: Payer: Commercial Managed Care - PPO | Admitting: Family Medicine

## 2017-04-16 ENCOUNTER — Ambulatory Visit: Payer: Commercial Managed Care - PPO | Admitting: Family Medicine

## 2017-04-16 DIAGNOSIS — Z0189 Encounter for other specified special examinations: Secondary | ICD-10-CM

## 2017-04-23 ENCOUNTER — Encounter: Payer: Self-pay | Admitting: Family Medicine

## 2017-04-23 ENCOUNTER — Ambulatory Visit (INDEPENDENT_AMBULATORY_CARE_PROVIDER_SITE_OTHER): Payer: Commercial Managed Care - PPO | Admitting: Family Medicine

## 2017-04-23 VITALS — BP 150/80 | HR 83 | Wt 352.0 lb

## 2017-04-23 DIAGNOSIS — Z125 Encounter for screening for malignant neoplasm of prostate: Secondary | ICD-10-CM | POA: Diagnosis not present

## 2017-04-23 DIAGNOSIS — Z6841 Body Mass Index (BMI) 40.0 and over, adult: Secondary | ICD-10-CM | POA: Diagnosis not present

## 2017-04-23 DIAGNOSIS — I10 Essential (primary) hypertension: Secondary | ICD-10-CM | POA: Diagnosis not present

## 2017-04-23 MED ORDER — OLMESARTAN-AMLODIPINE-HCTZ 40-5-12.5 MG PO TABS: 1.0000 | ORAL_TABLET | Freq: Every day | ORAL | 1 refills | Status: DC

## 2017-04-23 NOTE — Progress Notes (Signed)
Subjective:    CC: HTN  HPI:  Hypertension- Pt denies chest pain, SOB, dizziness, or heart palpitations.  Taking meds as directed w/o problems.  Denies medication side effects.    BMI > 40/obesity-he has actually gained about 11 pounds since I last saw him in December. Started working with a Psychologist, educationaltrainer about a month ago. Working out 2 days a week.  Out of town during the week.    Past medical history, Surgical history, Family history not pertinant except as noted below, Social history, Allergies, and medications have been entered into the medical record, reviewed, and corrections made.   Review of Systems: No fevers, chills, night sweats, weight loss, chest pain, or shortness of breath.   Objective:    General: Well Developed, well nourished, and in no acute distress.  Neuro: Alert and oriented x3, extra-ocular muscles intact, sensation grossly intact.  HEENT: Normocephalic, atraumatic, No thyromegaly  Skin: Warm and dry, no rashes. Cardiac: Regular rate and rhythm, no murmurs rubs or gallops, no lower extremity edema.  Respiratory: Clear to auscultation bilaterally. Not using accessory muscles, speaking in full sentences.   Impression and Recommendations:    HTN - Due for CMP and lipid. Uncontrolled today.  We discussed options of adjusting his medication. He wants to continue with his current regimen of really get back on track with his diet and exercise. He's been traveling out of town for the week for well over 6 months and that will come to end in about 2 weeks. He feels that these really gotten back on track in the last month with his diet and exercise and really wants to give it a chance to get back down with what is currently doing. I am going to switch him to try been sore though so that we can combine his medications. He actually used to be on this years ago when it was still generic.  Severe obesity/BMI 42-starting to work with his Systems analystpersonal trainer again. Also discussed getting  him back on track with portion control. Unfortunately he has been eating out a lot because he's been staying in hotels during the week because of work but that will be ending seen in a couple weeks.  Needs colon cancer screening. Has the cologuard information.  Due for PSA screening.

## 2017-04-26 ENCOUNTER — Other Ambulatory Visit: Payer: Self-pay | Admitting: Family Medicine

## 2017-06-25 ENCOUNTER — Ambulatory Visit: Payer: Commercial Managed Care - PPO | Admitting: Family Medicine

## 2017-07-13 ENCOUNTER — Ambulatory Visit: Payer: Commercial Managed Care - PPO | Admitting: Family Medicine

## 2017-07-13 DIAGNOSIS — Z0189 Encounter for other specified special examinations: Secondary | ICD-10-CM

## 2017-10-19 ENCOUNTER — Other Ambulatory Visit: Payer: Self-pay | Admitting: Family Medicine

## 2017-10-19 DIAGNOSIS — I10 Essential (primary) hypertension: Secondary | ICD-10-CM

## 2017-11-18 ENCOUNTER — Other Ambulatory Visit: Payer: Self-pay | Admitting: Family Medicine

## 2017-11-18 DIAGNOSIS — I10 Essential (primary) hypertension: Secondary | ICD-10-CM

## 2017-11-25 ENCOUNTER — Ambulatory Visit: Payer: Commercial Managed Care - PPO | Admitting: Family Medicine

## 2017-12-20 ENCOUNTER — Ambulatory Visit (INDEPENDENT_AMBULATORY_CARE_PROVIDER_SITE_OTHER): Payer: Commercial Managed Care - PPO | Admitting: Family Medicine

## 2017-12-20 ENCOUNTER — Other Ambulatory Visit: Payer: Self-pay | Admitting: Family Medicine

## 2017-12-20 ENCOUNTER — Encounter: Payer: Self-pay | Admitting: Family Medicine

## 2017-12-20 VITALS — BP 154/76 | HR 105 | Ht 69.0 in | Wt 367.0 lb

## 2017-12-20 DIAGNOSIS — I10 Essential (primary) hypertension: Secondary | ICD-10-CM

## 2017-12-20 DIAGNOSIS — J0100 Acute maxillary sinusitis, unspecified: Secondary | ICD-10-CM | POA: Diagnosis not present

## 2017-12-20 DIAGNOSIS — Z6841 Body Mass Index (BMI) 40.0 and over, adult: Secondary | ICD-10-CM | POA: Diagnosis not present

## 2017-12-20 MED ORDER — AZITHROMYCIN 250 MG PO TABS: ORAL_TABLET | ORAL | 0 refills | Status: AC

## 2017-12-20 NOTE — Progress Notes (Signed)
Subjective:    Patient ID: Joe Morrison, male    DOB: [birth year], 52 y.o.   MRN: [id number]  HPI 52 yo male with sinus sxs for 2 weeks.  He said sinus congestion sore throat.  The sore throat is a little better but most of his sinus congestion is in the left maxillary sinus.  No fevers chills or sweats.  Using Dayquail. Had a video visit and was tx'd with 3 days of zpack per regulations in [place] and he did feel some better but just did not completely resolved.      Hypertension- Pt denies chest pain, SOB, dizziness, or heart palpitations.  Taking meds as directed w/o problems.  Denies medication side effects.   He has felt better Since being on Tribenzor.   Also wanted to discuss his weight He has been strugglings says he now he can do it when he is motivated but has been traveling a lot for work and haaving a hard time getting started with weight loss.   Review of Systems  BP (!) 154/76   Pulse (!) 105   Ht 5\' 9"  (1.753 m)   Wt (!) 367 lb (166.5 kg)   SpO2 99%   BMI 54.20 kg/m     No Known Allergies  Past Medical History:  Diagnosis Date  . Alcoholic (HCC)    recovering since 10/1999    Past Surgical History:  Procedure Laterality Date  . MOUTH SURGERY     Dental work  . TONSILLECTOMY  1972    Social History   Socioeconomic History  . Marital status: Single    Spouse name: Not on file  . Number of children: 0  . Years of education: Not on file  . Highest education level: Not on file  Social Needs  . Financial resource strain: Not on file  . Food insecurity - worry: Not on file  . Food insecurity - inability: Not on file  . Transportation needs - medical: Not on file  . Transportation needs - non-medical: Not on file  Occupational History  . Not on file  Tobacco Use  . Smoking status: Former Smoker    Last attempt to quit: 04/27/1999    Years since quitting: 18.6  . Smokeless tobacco: Never Used  Substance and Sexual Activity  . Alcohol use: No   Comment: Former Advice workeralcoholic-Quit in [id number]  . Drug use: No  . Sexual activity: Not Currently    Partners: Male  Other Topics Concern  . Not on file  Social History Narrative   Working out daily with a Psychologist, educationaltrainer. No partner    Family History  Problem Relation Age of Onset  . Diabetes Mother   . Hypertension Mother   . Heart attack Father [phone]  . Alcohol abuse Other     Outpatient Encounter Medications as of 12/20/2017  Medication Sig  . Olmesartan-Amlodipine-HCTZ 40-5-12.5 MG TABS TAKE 1 TABLET BY MOUTH DAILY  . [DISCONTINUED] amLODipine (NORVASC) 5 MG tablet Take 1 tablet (5 mg total) by mouth daily.  . [DISCONTINUED] losartan-hydrochlorothiazide (HYZAAR) 100-12.5 MG tablet Take 1 tablet by mouth daily.  [name]. azithromycin (ZITHROMAX) 250 MG tablet 2 Ttabs PO on Day 1, then one a day x 4 days.   No facility-administered encounter medications on file as of 12/20/2017.          Objective:   Physical Exam  Constitutional: He is oriented to person, place, and time. He appears well-developed and well-nourished.  HENT:  Head: Normocephalic  and atraumatic.  Right Ear: External ear normal.  Left Ear: External ear normal.  Nose: Nose normal.  Mouth/Throat: Oropharynx is clear and moist.  TMs and canals are clear.   Eyes: Conjunctivae and EOM are normal. Pupils are equal, round, and reactive to light.  Neck: Neck supple. No thyromegaly present.  Cardiovascular: Normal rate and normal heart sounds.  Pulmonary/Chest: Effort normal and breath sounds normal.  Lymphadenopathy:    He has no cervical adenopathy.  Neurological: He is alert and oriented to person, place, and time.  Skin: Skin is warm and dry.  Psychiatric: He has a normal mood and affect.       Assessment & Plan:  Acute left maxillary sinusitis - will tx with zpack.  Call if not better.    Hypertension-not maximally controlled but he has been taking cold medication.  He notes his home blood pressures have been running in the  upper 120s to low 130s over 80s-90s since being on the Tribenzor.  We will have him follow-up in 2 weeks for nurse visit just to make sure that he is continuing to improve.  BMI greater than 50. Discussed option including weight loss medications.  Particularly Saxenda and Belviq. He will check with his insurance and let me know.  Work on diet and exercise.

## 2017-12-20 NOTE — Patient Instructions (Signed)
Follow up in 2 wks for BP check with Nurse.

## 2018-01-21 ENCOUNTER — Ambulatory Visit (INDEPENDENT_AMBULATORY_CARE_PROVIDER_SITE_OTHER): Payer: Commercial Managed Care - PPO | Admitting: Family Medicine

## 2018-01-21 VITALS — BP 162/84 | HR 97 | Wt 373.0 lb

## 2018-01-21 DIAGNOSIS — I10 Essential (primary) hypertension: Secondary | ICD-10-CM

## 2018-01-21 MED ORDER — METOPROLOL SUCCINATE ER 25 MG PO TB24: 25.0000 mg | ORAL_TABLET | Freq: Every day | ORAL | 1 refills | Status: DC

## 2018-01-21 NOTE — Progress Notes (Signed)
   Subjective:    Patient ID: Joe Morrison, male    DOB: 1966-09-17, 52 y.o. [name]  MRN: [id number]  HPI  Joe Morrison is here for blood pressure check. He is taking an OTC allergy medication without Sudafed. Denies chest pain, shortness of breath or  headaches. He does have dizziness every so often when he stands.   Review of Systems     Objective:   Physical Exam        Assessment & Plan:  HTN - Blood pressure is not controlled. Per Dr Linford ArnoldMetheney, Adding Metroprolol XL 25mg  once daily. Patient to return in 2 weeks for nurse visit blood pressure check. Advised patient to check his pulse when he checks his blood pressure. Also to call if drops below 60 BPM.

## 2018-01-21 NOTE — Progress Notes (Signed)
Agree with documentation as above.   Zacharias Ridling, MD  

## 2018-01-22 ENCOUNTER — Other Ambulatory Visit: Payer: Self-pay | Admitting: Family Medicine

## 2018-01-22 DIAGNOSIS — I10 Essential (primary) hypertension: Secondary | ICD-10-CM

## 2018-01-24 ENCOUNTER — Other Ambulatory Visit: Payer: Self-pay

## 2018-02-08 ENCOUNTER — Ambulatory Visit: Payer: Commercial Managed Care - PPO

## 2018-03-19 ENCOUNTER — Other Ambulatory Visit: Payer: Self-pay | Admitting: Family Medicine

## 2018-03-24 ENCOUNTER — Ambulatory Visit: Payer: Commercial Managed Care - PPO

## 2018-04-20 ENCOUNTER — Other Ambulatory Visit: Payer: Self-pay | Admitting: Family Medicine

## 2018-06-23 ENCOUNTER — Ambulatory Visit: Payer: Commercial Managed Care - PPO | Admitting: Family Medicine

## 2018-07-19 ENCOUNTER — Other Ambulatory Visit: Payer: Self-pay | Admitting: Family Medicine

## 2018-07-19 DIAGNOSIS — I10 Essential (primary) hypertension: Secondary | ICD-10-CM

## 2018-08-17 ENCOUNTER — Other Ambulatory Visit: Payer: Self-pay | Admitting: Family Medicine

## 2018-08-17 DIAGNOSIS — I10 Essential (primary) hypertension: Secondary | ICD-10-CM

## 2018-08-22 ENCOUNTER — Ambulatory Visit: Payer: Commercial Managed Care - PPO | Admitting: Family Medicine

## 2018-09-19 ENCOUNTER — Telehealth: Payer: Self-pay

## 2018-09-19 DIAGNOSIS — I10 Essential (primary) hypertension: Secondary | ICD-10-CM

## 2018-09-19 MED ORDER — OLMESARTAN-AMLODIPINE-HCTZ 40-5-12.5 MG PO TABS: 1.0000 | ORAL_TABLET | Freq: Every day | ORAL | 0 refills | Status: DC

## 2018-09-19 MED ORDER — METOPROLOL SUCCINATE ER 25 MG PO TB24: ORAL_TABLET | ORAL | 0 refills | Status: DC

## 2018-09-19 NOTE — Telephone Encounter (Signed)
Pt called requesting refills on BP meds.   Called and told pt he needs to keep his upcoming appt on 09/26/18 since last several appts were no-showed.   Pt agreeable, will only send in 1 week of medication.

## 2018-09-26 ENCOUNTER — Ambulatory Visit (INDEPENDENT_AMBULATORY_CARE_PROVIDER_SITE_OTHER): Payer: Commercial Managed Care - PPO | Admitting: Family Medicine

## 2018-09-26 ENCOUNTER — Encounter: Payer: Self-pay | Admitting: Family Medicine

## 2018-09-26 VITALS — BP 141/76 | HR 103 | Ht 69.0 in | Wt 376.0 lb

## 2018-09-26 DIAGNOSIS — I10 Essential (primary) hypertension: Secondary | ICD-10-CM

## 2018-09-26 DIAGNOSIS — Z6841 Body Mass Index (BMI) 40.0 and over, adult: Secondary | ICD-10-CM | POA: Diagnosis not present

## 2018-09-26 DIAGNOSIS — E781 Pure hyperglyceridemia: Secondary | ICD-10-CM | POA: Diagnosis not present

## 2018-09-26 DIAGNOSIS — Z125 Encounter for screening for malignant neoplasm of prostate: Secondary | ICD-10-CM

## 2018-09-26 MED ORDER — METOPROLOL SUCCINATE ER 25 MG PO TB24: ORAL_TABLET | ORAL | 1 refills | Status: DC

## 2018-09-26 MED ORDER — OLMESARTAN-AMLODIPINE-HCTZ 40-5-12.5 MG PO TABS: 1.0000 | ORAL_TABLET | Freq: Every day | ORAL | 1 refills | Status: DC

## 2018-09-26 MED ORDER — OLMESARTAN-AMLODIPINE-HCTZ 40-5-12.5 MG PO TABS: 1.0000 | ORAL_TABLET | Freq: Every day | ORAL | 0 refills | Status: DC

## 2018-09-26 MED ORDER — METOPROLOL SUCCINATE ER 50 MG PO TB24: 50.0000 mg | ORAL_TABLET | Freq: Every day | ORAL | 0 refills | Status: DC

## 2018-09-26 NOTE — Progress Notes (Signed)
Called pharmacy and spoke with Myriam JacobsonHelen and she cancelled the 2 BP med in the chart.

## 2018-09-26 NOTE — Patient Instructions (Signed)
You can check with your insurance to see if they might cover Saxenda or Belviq or Contrave for weight loss.

## 2018-09-26 NOTE — Progress Notes (Signed)
Subjective:    CC: BP ck  HPI:  Hypertension- Pt denies chest pain, SOB, dizziness, or heart palpitations.  Taking meds as directed w/o problems.  Denies medication side effects.    prostate cancer screening.  Hypertriglyceridemia-due to recheck cholesterol panel.  He is not currently on medication for this.  Morbid obesity-he has been struggling with his weight he has not been able to commit to consistent exercise recently.  He is just frustrated with himself.    BP (!) 141/76   Pulse (!) 103   Ht 5\' 9"  (1.753 m)   Wt (!) 376 lb (170.6 kg)   SpO2 96%   BMI 55.53 kg/m     No Known Allergies  Past Medical History:  Diagnosis Date  . Alcoholic (HCC)    recovering since 10/1999    Past Surgical History:  Procedure Laterality Date  . MOUTH SURGERY     Dental work  . TONSILLECTOMY  1972    Social History   Socioeconomic History  . Marital status: Single    Spouse name: Not on file  . Number of children: 0  . Years of education: Not on file  . Highest education level: Not on file  Occupational History  . Not on file  Social Needs  . Financial resource strain: Not on file  . Food insecurity:    Worry: Not on file    Inability: Not on file  . Transportation needs:    Medical: Not on file    Non-medical: Not on file  Tobacco Use  . Smoking status: Former Smoker    Last attempt to quit: 04/27/1999    Years since quitting: 19.4  . Smokeless tobacco: Never Used  Substance and Sexual Activity  . Alcohol use: No    Comment: Former Advice workeralcoholic-Quit in [id number]  . Drug use: No  . Sexual activity: Not Currently    Partners: Male  Lifestyle  . Physical activity:    Days per week: Not on file    Minutes per session: Not on file  . Stress: Not on file  Relationships  . Social connections:    Talks on phone: Not on file    Gets together: Not on file    Attends religious service: Not on file    Active member of club or organization: Not on file    Attends meetings of  clubs or organizations: Not on file    Relationship status: Not on file  . Intimate partner violence:    Fear of current or ex partner: Not on file    Emotionally abused: Not on file    Physically abused: Not on file    Forced sexual activity: Not on file  Other Topics Concern  . Not on file  Social History Narrative   Working out daily with a Psychologist, educationaltrainer. No partner    Family History  Problem Relation Age of Onset  . Diabetes Mother   . Hypertension Mother   . Heart attack Father [phone]  . Alcohol abuse Other     Outpatient Encounter Medications as of 09/26/2018  Medication Sig  . [DISCONTINUED] metoprolol succinate (TOPROL-XL) 25 MG 24 hr tablet TAKE 1 TABLET(25 MG) BY MOUTH DAILY. Must keep upcoming appt  . [DISCONTINUED] Olmesartan-amLODIPine-HCTZ 40-5-12.5 MG TABS Take 1 tablet by mouth daily. Must keep upcoming appt  . metoprolol succinate (TOPROL-XL) 50 MG 24 hr tablet Take 1 tablet (50 mg total) by mouth daily. TAKE 1 TABLET(25 MG) BY MOUTH DAILY.  [name]. Olmesartan-amLODIPine-HCTZ  40-5-12.5 MG TABS Take 1 tablet by mouth daily.  . [DISCONTINUED] metoprolol succinate (TOPROL-XL) 25 MG 24 hr tablet TAKE 1 TABLET(25 MG) BY MOUTH DAILY. (Patient not taking: Reported on 09/26/2018)  . [DISCONTINUED] Olmesartan-amLODIPine-HCTZ 40-5-12.5 MG TABS Take 1 tablet by mouth daily. (Patient not taking: Reported on 09/26/2018)   No facility-administered encounter medications on file as of 09/26/2018.       Objective:    General: Well Developed, well nourished, and in no acute distress.  Neuro: Alert and oriented x3, extra-ocular muscles intact, sensation grossly intact.  HEENT: Normocephalic, atraumatic  Skin: Warm and dry, no rashes. Cardiac: Regular rate and rhythm, no murmurs rubs or gallops, no lower extremity edema.  Respiratory: Clear to auscultation bilaterally. Not using accessory muscles, speaking in full sentences.   Impression and Recommendations:    HTN -uncontrolled.  Will  increase metoprolol to 50 mg.  Reminded him that he is well overdue for lab work it is been actually over 3 years since he is gone and it is extremely important that we check his renal function potassium for his to continue on his current regimen.  Hypertriglyceridemia-we will recheck labs today.  We also discussed need for colon cancer screening.  He did check with his insurance and the Cologuard is not covered so we discussed how colonoscopy works and encouraged him to let me know when he is ready to schedule.  Morbid obesity/BMI > 50 = discussed options.  We could certainly consider prescription medication he can check with his insurance to see if they are covered.  I did encourage him to get back in with his personal trainer who he was working with for a time and was actually doing really well.  Reports that his tetanus was done around 2015.  He said that and his flu shot done at work.  Shingles vaccine discussed today as well.

## 2018-10-24 ENCOUNTER — Ambulatory Visit: Payer: Commercial Managed Care - PPO

## 2018-11-03 ENCOUNTER — Ambulatory Visit: Payer: Commercial Managed Care - PPO

## 2018-12-19 ENCOUNTER — Encounter: Payer: Self-pay | Admitting: Family Medicine

## 2018-12-19 ENCOUNTER — Ambulatory Visit (INDEPENDENT_AMBULATORY_CARE_PROVIDER_SITE_OTHER): Payer: Commercial Managed Care - PPO | Admitting: Family Medicine

## 2018-12-19 VITALS — BP 125/58 | HR 79 | Ht 69.0 in | Wt 359.0 lb

## 2018-12-19 DIAGNOSIS — R0602 Shortness of breath: Secondary | ICD-10-CM

## 2018-12-19 DIAGNOSIS — I10 Essential (primary) hypertension: Secondary | ICD-10-CM | POA: Diagnosis not present

## 2018-12-19 NOTE — Progress Notes (Signed)
Subjective:    CC: BP.   HPI:  Hypertension- Pt denies chest pain, SOB, dizziness, or heart palpitations.  Taking meds as directed w/o problems.  Denies medication side effects. He is on triple therapy.  We had increased hi metoprolol to 50mg  at last OV.  Reports that his home blood pressures have been running in the 130s/ 70s.  He has noticed that it has decreased his pulse into the 50s at rest.  So far he is not having any major issues with low pulse and exercise.  Obesity, morbid -   He has started working with a Systems analyst again and is doing well.  He is down to 359 lbs.  He did check on the saxenda and he doesn't want to add another medication.   He also wanted to note some in work of breathing with acitivity. He denies any CP with it.  He says that he has been feeling better since he has been working out. No wheezing.    Past medical history, Surgical history, Family history not pertinant except as noted below, Social history, Allergies, and medications have been entered into the medical record, reviewed, and corrections made.   Review of Systems: No fevers, chills, night sweats, weight loss, chest pain, or shortness of breath.   Objective:    General: Well Developed, well nourished, and in no acute distress.  Neuro: Alert and oriented x3, extra-ocular muscles intact, sensation grossly intact.  HEENT: Normocephalic, atraumatic  Skin: Warm and dry, no rashes. Cardiac: Regular rate and rhythm, no murmurs rubs or gallops, no lower extremity edema.  Respiratory: Clear to auscultation bilaterally. Not using accessory muscles, speaking in full sentences.   Impression and Recommendations:    HTN - Well controlled. Continue current regimen. Follow up in  3-4 months.  Mentally if he continues to lose weight I am hoping that we can actually discontinue the metoprolol.  Severe obesity/BMI 56 - he is doing a great Job!  Can you working with Systems analyst.  He declines [place] today.   He is really made some fantastic progress to just encourage him to keep it up.  SOB -aims to be better more recently with weight loss and increased activity level.  We did discuss possibly getting an echocardiogram.  He will think about it and let me know he would rather wait till the next office visit to make a decision.

## 2018-12-20 LAB — COMPLETE METABOLIC PANEL WITH GFR
AG Ratio: 1.8 (calc) (ref 1.0–2.5)
ALBUMIN MSPROF: 4.2 g/dL (ref 3.6–5.1)
ALKALINE PHOSPHATASE (APISO): 48 U/L (ref 35–144)
ALT: 19 U/L (ref 9–46)
AST: 20 U/L (ref 10–35)
BUN: 15 mg/dL (ref 7–25)
CO2: 27 mmol/L (ref 20–32)
CREATININE: 1.08 mg/dL (ref 0.70–1.33)
Calcium: 9.5 mg/dL (ref 8.6–10.3)
Chloride: 109 mmol/L (ref 98–110)
GFR, EST AFRICAN AMERICAN: 90 mL/min/{1.73_m2} (ref >=60)
GFR, Est Non African American: 77 mL/min/{1.73_m2} (ref >=60)
GLUCOSE: 101 mg/dL — AB (ref 65–99)
Globulin: 2.3 g/dL (calc) (ref 1.9–3.7)
Potassium: 4.7 mmol/L (ref 3.5–5.3)
Sodium: 143 mmol/L (ref 135–146)
TOTAL PROTEIN: 6.5 g/dL (ref 6.1–8.1)
Total Bilirubin: 0.4 mg/dL (ref 0.2–1.2)

## 2018-12-20 LAB — PSA: PSA: 0.5 ng/mL (ref <=4.0)

## 2018-12-20 LAB — LIPID PANEL
Cholesterol: 183 mg/dL (ref <200)
HDL: 33 mg/dL — ABNORMAL LOW (ref >=40)
LDL Cholesterol (Calc): 127 mg/dL (calc) — ABNORMAL HIGH
NON-HDL CHOLESTEROL (CALC): 150 mg/dL — AB (ref <130)
Total CHOL/HDL Ratio: 5.5 (calc) — ABNORMAL HIGH (ref <5.0)
Triglycerides: 124 mg/dL (ref <150)

## 2018-12-26 ENCOUNTER — Other Ambulatory Visit: Payer: Self-pay

## 2018-12-26 DIAGNOSIS — I10 Essential (primary) hypertension: Secondary | ICD-10-CM

## 2018-12-26 MED ORDER — METOPROLOL SUCCINATE ER 50 MG PO TB24: 50.0000 mg | ORAL_TABLET | Freq: Every day | ORAL | 1 refills | Status: DC

## 2019-06-16 ENCOUNTER — Other Ambulatory Visit: Payer: Self-pay | Admitting: Family Medicine

## 2019-06-16 DIAGNOSIS — I10 Essential (primary) hypertension: Secondary | ICD-10-CM

## 2019-06-21 ENCOUNTER — Ambulatory Visit: Payer: Commercial Managed Care - PPO | Admitting: Family Medicine

## 2019-06-21 ENCOUNTER — Other Ambulatory Visit: Payer: Self-pay | Admitting: *Deleted

## 2019-06-21 DIAGNOSIS — I10 Essential (primary) hypertension: Secondary | ICD-10-CM

## 2019-06-21 MED ORDER — METOPROLOL SUCCINATE ER 50 MG PO TB24: 50.0000 mg | ORAL_TABLET | Freq: Every day | ORAL | 1 refills | Status: DC

## 2019-07-03 ENCOUNTER — Ambulatory Visit: Payer: Commercial Managed Care - PPO | Admitting: Family Medicine

## 2019-12-21 ENCOUNTER — Other Ambulatory Visit: Payer: Self-pay | Admitting: Family Medicine

## 2019-12-21 DIAGNOSIS — I10 Essential (primary) hypertension: Secondary | ICD-10-CM

## 2019-12-25 ENCOUNTER — Other Ambulatory Visit: Payer: Self-pay

## 2019-12-25 DIAGNOSIS — I10 Essential (primary) hypertension: Secondary | ICD-10-CM

## 2019-12-25 MED ORDER — METOPROLOL SUCCINATE ER 50 MG PO TB24: 50.0000 mg | ORAL_TABLET | Freq: Every day | ORAL | 1 refills | Status: DC

## 2020-01-02 ENCOUNTER — Ambulatory Visit: Payer: Commercial Managed Care - PPO | Admitting: Family Medicine

## 2020-01-15 ENCOUNTER — Ambulatory Visit: Payer: Commercial Managed Care - PPO | Admitting: Family Medicine

## 2020-01-15 NOTE — Progress Notes (Deleted)
Established Patient Office Visit  Subjective:  Patient ID: Joe Morrison, male    DOB: [birth year]  Age: 54 y.o. MRN: [id number]  CC: No chief complaint on file.   HPI Joe Morrison presents for   Hypertension- Pt denies chest pain, SOB, dizziness, or heart palpitations.  Taking meds as directed w/o problems.  Denies medication side effects.      Past Medical History:  Diagnosis Date  . Alcoholic (HCC)    recovering since 10/1999    Past Surgical History:  Procedure Laterality Date  . MOUTH SURGERY     Dental work  . TONSILLECTOMY  1972    Family History  Problem Relation Age of Onset  . Diabetes Mother   . Hypertension Mother   . Heart attack Father 88  . Alcohol abuse Other     Social History   Socioeconomic History  . Marital status: Single    Spouse name: Not on file  . Number of children: 0  . Years of education: Not on file  . Highest education level: Not on file  Occupational History  . Not on file  Tobacco Use  . Smoking status: Former Smoker    Quit date: 04/27/1999    Years since quitting: 20.7  . Smokeless tobacco: Never Used  Substance and Sexual Activity  . 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 Drug use: No  . Sexual activity: Not Currently    Partners: Male  Other Topics Concern  . Not on file  Social History Narrative   Working out daily with a Psychologist, educational. No partner   Social Determinants of Corporate investment banker Strain:   . Difficulty of Paying Living Expenses:   Food Insecurity:   . Worried About Programme researcher, broadcasting/film/video in the Last Year:   . Barista in the Last Year:   Transportation Needs:   . Freight forwarder (Medical):   [name] Lack of Transportation (Non-Medical):   Physical Activity:   . Days of Exercise per Week:   . Minutes of Exercise per Session:   Stress:   . Feeling of Stress :   Social Connections:   . Frequency of Communication with Friends and Family:   . Frequency of  Social Gatherings with Friends and Family:   . Attends Religious Services:   . Active Member of Clubs or Organizations:   . Attends Banker Meetings:   [name] Marital Status:   Intimate Partner Violence:   . Fear of Current or Ex-Partner:   . Emotionally Abused:   [name] Physically Abused:   . Sexually Abused:     Outpatient Medications Prior to Visit  Medication Sig Dispense Refill  . metoprolol succinate (TOPROL-XL) 50 MG 24 hr tablet Take 1 tablet (50 mg total) by mouth daily. 90 tablet 1  . Olmesartan-amLODIPine-HCTZ 40-5-12.5 MG TABS TAKE 1 TABLET BY MOUTH DAILY 90 tablet 0   No facility-administered medications prior to visit.    No Known Allergies  ROS Review of Systems    Objective:    Physical Exam  Constitutional: He is oriented to person, place, and time. He appears well-developed and well-nourished.  HENT:  Head: Normocephalic and atraumatic.  Cardiovascular: Normal rate, regular rhythm and normal heart sounds.  Pulmonary/Chest: Effort normal and breath sounds normal.  Neurological: He is alert and oriented to person, place, and time.  Skin: Skin is warm and dry.  Psychiatric: He has a  normal mood and affect. His behavior is normal.    There were no vitals taken for this visit. Wt Readings from Last 3 Encounters:  12/19/18 (!) 359 lb (162.8 kg)  09/26/18 (!) 376 lb (170.6 kg)  01/21/18 (!) 373 lb (169.2 kg)     Health Maintenance Due  Topic Date Due  . Hepatitis C Screening  Never done  . HIV Screening  Never done  . COLONOSCOPY  Never done    There are no preventive care reminders to display for this patient.  No results found for: TSH No results found for: WBC, HGB, HCT, MCV, PLT Lab Results  Component Value Date   NA 143 12/19/2018   K 4.7 12/19/2018   CO2 27 12/19/2018   GLUCOSE 101 (H) 12/19/2018   BUN 15 12/19/2018   CREATININE 1.08 12/19/2018   BILITOT 0.4 12/19/2018   ALKPHOS 42 06/19/2015   AST 20 12/19/2018   ALT 19  12/19/2018   PROT 6.5 12/19/2018   ALBUMIN 4.3 11/01/2012   CALCIUM 9.5 12/19/2018   Lab Results  Component Value Date   CHOL 183 12/19/2018   Lab Results  Component Value Date   HDL 33 (L) 12/19/2018   Lab Results  Component Value Date   LDLCALC 127 (H) 12/19/2018   Lab Results  Component Value Date   TRIG 124 12/19/2018   Lab Results  Component Value Date   CHOLHDL 5.5 (H) 12/19/2018   No results found for: HGBA1C    Assessment & Plan:   Problem List Items Addressed This Visit      Cardiovascular and Mediastinum   HYPERTENSION, BENIGN SYSTEMIC - Primary     Other   Severe obesity (BMI >= 40) ([place])    Other Visit Diagnoses    Encounter for hepatitis C screening test for low risk patient       Screening for HIV without presence of risk factors          No orders of the defined types were placed in this encounter.   Follow-up: No follow-ups on file.    Beatrice Lecher, MD

## 2020-03-22 ENCOUNTER — Other Ambulatory Visit: Payer: Self-pay | Admitting: Family Medicine

## 2020-03-22 DIAGNOSIS — I10 Essential (primary) hypertension: Secondary | ICD-10-CM

## 2020-03-27 ENCOUNTER — Ambulatory Visit: Payer: Commercial Managed Care - PPO | Admitting: Family Medicine

## 2020-03-28 ENCOUNTER — Ambulatory Visit: Payer: Commercial Managed Care - PPO | Admitting: Family Medicine

## 2020-03-28 NOTE — Progress Notes (Deleted)
Established Patient Office Visit  Subjective:  Patient ID: Joe Morrison, male    DOB: Mar 15, 1966  Age: 54 y.o. MRN: [id number]  CC: No chief complaint on file.   HPI BURL TAUZIN presents for   Hypertension- Pt denies chest pain, SOB, dizziness, or heart palpitations.  Taking meds as directed w/o problems.  Denies medication side effects.      Past Medical History:  Diagnosis Date   Alcoholic (HCC)    recovering since 10/1999    Past Surgical History:  Procedure Laterality Date   MOUTH SURGERY     Dental work   TONSILLECTOMY  1972    Family History  Problem Relation Age of Onset   Diabetes Mother    Hypertension Mother    Heart attack Father 63   Alcohol abuse Other     Social History   Socioeconomic History   Marital status: Single    Spouse name: Not on file   Number of children: 0   Years of education: Not on file   Highest education level: Not on file  Occupational History   Not on file  Tobacco Use   Smoking status: Former Smoker    Quit date: 04/27/1999    Years since quitting: 20.9   Smokeless tobacco: Never Used  Substance and Sexual Activity   Alcohol use: No    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 No partner   Social Determinants of Corporate investment banker Strain:    Difficulty of Paying Living Expenses:   Food Insecurity:    Worried About Programme researcher, broadcasting/film/video in the Last Year:    Barista in the Last Year:   Transportation Needs:    Freight forwarder (Medical):    Lack of Transportation (Non-Medical):   Physical Activity:    Days of Exercise per Week:    Minutes of Exercise per Session:   Stress:    Feeling of Stress :   Social Connections:    Frequency of Communication with Friends and Family:    Frequency of  Social Gatherings with Friends and Family:    Attends Religious Services:    Active Member of Clubs or Organizations:    Attends Engineer, structural:    Marital Status:   Intimate Partner Violence:    Fear of Current or Ex-Partner:    Emotionally Abused:    Physically Abused:    Sexually Abused:     Outpatient Medications Prior to Visit  Medication Sig Dispense Refill   metoprolol succinate (TOPROL-XL) 50 MG 24 hr tablet Take 1 tablet (50 mg total) by mouth daily. 90 tablet 1   Olmesartan-amLODIPine-HCTZ 40-5-12.5 MG TABS TAKE 1 TABLET BY MOUTH DAILY 90 tablet 0   No facility-administered medications prior to visit.    No Known Allergies  ROS Review of Systems    Objective:    Physical Exam Constitutional:      Appearance: He is well-developed.  HENT:     Head: Normocephalic and atraumatic.  Cardiovascular:     Rate and Rhythm: Normal rate and regular rhythm.     Heart sounds: Normal heart sounds.  Pulmonary:     Effort: Pulmonary effort is normal.     Breath sounds: Normal breath sounds.  Skin:    General: Skin is warm and dry.  Neurological:     Mental Status: He is alert and oriented to person, place, and time.  Psychiatric:        Behavior: Behavior normal.     There were no vitals taken for this visit. Wt Readings from Last 3 Encounters:  12/19/18 (!) 359 lb (162.8 kg)  09/26/18 (!) 376 lb (170.6 kg)  01/21/18 (!) 373 lb (169.2 kg)     Health Maintenance Due  Topic Date Due   Hepatitis C Screening  Never done   COVID-19 Vaccine (1) Never done   HIV Screening  Never done   COLONOSCOPY  Never done    There are no preventive care reminders to display for this patient.  No results found for: TSH No results found for: WBC, HGB, HCT, MCV, PLT Lab Results  Component Value Date   NA 143 12/19/2018   K 4.7 12/19/2018   CO2 27 12/19/2018   GLUCOSE 101 (H) 12/19/2018   BUN 15 12/19/2018   CREATININE 1.08 12/19/2018    BILITOT 0.4 12/19/2018   ALKPHOS 42 06/19/2015   AST 20 12/19/2018   ALT 19 12/19/2018   PROT 6.5 12/19/2018   ALBUMIN 4.3 11/01/2012   CALCIUM 9.5 12/19/2018   Lab Results  Component Value Date   CHOL 183 12/19/2018   Lab Results  Component Value Date   HDL 33 (L) 12/19/2018   Lab Results  Component Value Date   LDLCALC 127 (H) 12/19/2018   Lab Results  Component Value Date   TRIG 124 12/19/2018   Lab Results  Component Value Date   CHOLHDL 5.5 (H) 12/19/2018   No results found for: HGBA1C    Assessment & Plan:   Problem List Items Addressed This Visit      Cardiovascular and Mediastinum   HYPERTENSION, BENIGN SYSTEMIC - Primary    Other Visit Diagnoses    Prostate cancer screening       Encounter for hepatitis C screening test for low risk patient          No orders of the defined types were placed in this encounter.   Follow-up: No follow-ups on file.    Beatrice Lecher, MD

## 2020-04-18 ENCOUNTER — Ambulatory Visit: Payer: Commercial Managed Care - PPO | Admitting: Family Medicine

## 2020-06-20 ENCOUNTER — Other Ambulatory Visit: Payer: Self-pay | Admitting: Family Medicine

## 2020-06-20 DIAGNOSIS — I10 Essential (primary) hypertension: Secondary | ICD-10-CM

## 2020-06-24 ENCOUNTER — Encounter: Payer: Self-pay | Admitting: Family Medicine

## 2020-06-24 ENCOUNTER — Telehealth (INDEPENDENT_AMBULATORY_CARE_PROVIDER_SITE_OTHER): Payer: Commercial Managed Care - PPO | Admitting: Family Medicine

## 2020-06-24 VITALS — BP 138/84 | Ht 69.0 in | Wt 320.0 lb

## 2020-06-24 DIAGNOSIS — I10 Essential (primary) hypertension: Secondary | ICD-10-CM

## 2020-06-24 DIAGNOSIS — Z1211 Encounter for screening for malignant neoplasm of colon: Secondary | ICD-10-CM | POA: Diagnosis not present

## 2020-06-24 DIAGNOSIS — F320 Major depressive disorder, single episode, mild: Secondary | ICD-10-CM | POA: Insufficient documentation

## 2020-06-24 DIAGNOSIS — Z125 Encounter for screening for malignant neoplasm of prostate: Secondary | ICD-10-CM | POA: Diagnosis not present

## 2020-06-24 NOTE — Assessment & Plan Note (Signed)
BPs have been running in the 130s.  But occ dropping with exercise so when that happens he will feel a little dizzy and he will usually split the metoprolol in half which is perfectly fine did encourage him to continue to keep an eye on the blood pressures if over time he continues to lose weight and will likely come down we will likely need to decrease his dose in general anyway.  But for now we will continue with 50 mg tab.  Due for updated blood work he is technically overdue.  We will get that placed and he can go at his convenience.

## 2020-06-24 NOTE — Assessment & Plan Note (Signed)
Discussed options.  I think she would benefit from an therapy/counseling he felt open to that idea as we will go ahead and place referral today.  Certainly if symptoms persist or get worse then we can always regroup and discuss about other treatment options if needed.

## 2020-06-24 NOTE — Progress Notes (Signed)
Virtual Visit via Video Note  I connected with Joe Morrison on 06/24/20 at  9:30 AM EDT by a video enabled telemedicine application and verified that I am speaking with the correct person using two identifiers.   I discussed the limitations of evaluation and management by telemedicine and the availability of in person appointments. The patient expressed understanding and agreed to proceed.  Patient location: at work.  Provider location: in office   Established Patient Office Visit  Subjective:  Patient ID: Joe Morrison, male    DOB: 10-Jun-1966  Age: 54 y.o. MRN: [id number]  CC:  Chief Complaint  Patient presents with  . Hypertension    HPI Joe Morrison presents for   Hypertension- Pt denies chest pain, SOB, dizziness, or heart palpitations.  Taking meds as directed w/o problems.  Denies medication side effects.    Also reports that he has been feeling a little bit more down in general work has been a little bit stressful and he feels like he is coming to a point where he is can have to make some decisions and that has been a lot worse and he does feel like he has a good support system but admits he does not always rely on them probably as much as he should he has been feeling a little bit more down.   Past Medical History:  Diagnosis Date  . Alcoholic (HCC)    recovering since 10/1999    Past Surgical History:  Procedure Laterality Date  . MOUTH SURGERY     Dental work  . TONSILLECTOMY  1972    Family History  Problem Relation Age of Onset  . Diabetes Mother   . Hypertension Mother   . Heart attack Father 44  . Alcohol abuse Other     Social History   Socioeconomic History  . Marital status: Single    Spouse name: Not on file  . Number of children: 0  . Years of education: Not on file  . Highest education level: Not on file  Occupational History  . Not on file  Tobacco Use  . Smoking status: Former Smoker    Quit date: 04/27/1999    Years since  quitting: 21.1  . Smokeless tobacco: Never Used  Substance and Sexual Activity  . 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 Drug use: No  . Sexual activity: Not Currently    Partners: Male  Other Topics Concern  . Not on file  Social History Narrative   Working out daily with a Psychologist, educational. No partner   Social Determinants of Corporate investment banker Strain:   . Difficulty of Paying Living Expenses: Not on file  Food Insecurity:   . Worried About Programme researcher, broadcasting/film/video in the Last Year: Not on file  . Ran Out of Food in the Last Year: Not on file  Transportation Needs:   . Lack of Transportation (Medical): Not on file  . Lack of Transportation (Non-Medical): Not on file  Physical Activity:   . Days of Exercise per Week: Not on file  . Minutes of Exercise per Session: Not on file  Stress:   . Feeling of Stress : Not on file  Social Connections:   . Frequency of Communication with Friends and Family: Not on file  . Frequency of Social Gatherings with Friends and Family: Not on file  . Attends Religious Services: Not on file  . Active Member of Clubs  or Organizations: Not on file  . Attends Banker Meetings: Not on file  . Marital Status: Not on file  Intimate Partner Violence:   . Fear of Current or Ex-Partner: Not on file  . Emotionally Abused: Not on file  . Physically Abused: Not on file  . Sexually Abused: Not on file    Outpatient Medications Prior to Visit  Medication Sig Dispense Refill  . metoprolol succinate (TOPROL-XL) 50 MG 24 hr tablet TAKE 1 TABLET(50 MG) BY MOUTH DAILY 90 tablet 1  . Olmesartan-amLODIPine-HCTZ 40-5-12.5 MG TABS TAKE 1 TABLET BY MOUTH DAILY 90 tablet 0   No facility-administered medications prior to visit.    No Known Allergies  ROS Review of Systems    Objective:    Physical Exam  BP 138/84   Ht 5\' 9"  (1.753 m)   Wt (!) 320 lb (145.2 kg)   BMI 47.26 kg/m  Wt Readings from Last 3 Encounters:   06/24/20 (!) 320 lb (145.2 kg)  12/19/18 (!) 359 lb (162.8 kg)  09/26/18 (!) 376 lb (170.6 kg)     Health Maintenance Due  Topic Date Due  . Hepatitis C Screening  Never done  . HIV Screening  Never done  . COLONOSCOPY  Never done    There are no preventive care reminders to display for this patient.  No results found for: TSH No results found for: WBC, HGB, HCT, MCV, PLT Lab Results  Component Value Date   NA 143 12/19/2018   K 4.7 12/19/2018   CO2 27 12/19/2018   GLUCOSE 101 (H) 12/19/2018   BUN 15 12/19/2018   CREATININE 1.08 12/19/2018   BILITOT 0.4 12/19/2018   ALKPHOS 42 06/19/2015   AST 20 12/19/2018   ALT 19 12/19/2018   PROT 6.5 12/19/2018   ALBUMIN 4.3 11/01/2012   CALCIUM 9.5 12/19/2018   Lab Results  Component Value Date   CHOL 183 12/19/2018   Lab Results  Component Value Date   HDL 33 (L) 12/19/2018   Lab Results  Component Value Date   LDLCALC 127 (H) 12/19/2018   Lab Results  Component Value Date   TRIG 124 12/19/2018   Lab Results  Component Value Date   CHOLHDL 5.5 (H) 12/19/2018   No results found for: HGBA1C    Assessment & Plan:   Problem List Items Addressed This Visit      Cardiovascular and Mediastinum   HYPERTENSION, BENIGN SYSTEMIC - Primary    BPs have been running in the 130s.  But occ dropping with exercise so when that happens he will feel a little dizzy and he will usually split the metoprolol in half which is perfectly fine did encourage him to continue to keep an eye on the blood pressures if over time he continues to lose weight and will likely come down we will likely need to decrease his dose in general anyway.  But for now we will continue with 50 mg tab.  Due for updated blood work he is technically overdue.  We will get that placed and he can go at his convenience.      Relevant Orders   CBC   COMPLETE METABOLIC PANEL WITH GFR   Lipid panel     Other   Depression, major, single episode, mild (HCC)     Discussed options.  I think she would benefit from an therapy/counseling he felt open to that idea as we will go ahead and place referral today.  Certainly if  symptoms persist or get worse then we can always regroup and discuss about other treatment options if needed.      Relevant Orders   Ambulatory referral to Behavioral Health    Other Visit Diagnoses    Screening for prostate cancer       Relevant Orders   PSA   Screen for colon cancer       Relevant Orders   Cologuard      No orders of the defined types were placed in this encounter.   Follow-up: Return in about 6 months (around 12/22/2020) for Hypertension.    Nani Gasser, MD

## 2020-07-17 ENCOUNTER — Ambulatory Visit (INDEPENDENT_AMBULATORY_CARE_PROVIDER_SITE_OTHER): Payer: Commercial Managed Care - PPO | Admitting: Professional

## 2020-07-17 DIAGNOSIS — F321 Major depressive disorder, single episode, moderate: Secondary | ICD-10-CM | POA: Diagnosis not present

## 2020-07-26 ENCOUNTER — Ambulatory Visit (INDEPENDENT_AMBULATORY_CARE_PROVIDER_SITE_OTHER): Payer: Commercial Managed Care - PPO | Admitting: Professional

## 2020-07-26 DIAGNOSIS — F321 Major depressive disorder, single episode, moderate: Secondary | ICD-10-CM

## 2020-07-30 ENCOUNTER — Encounter: Payer: Self-pay | Admitting: Family Medicine

## 2020-08-01 ENCOUNTER — Ambulatory Visit: Payer: Commercial Managed Care - PPO | Admitting: Professional

## 2020-08-08 ENCOUNTER — Ambulatory Visit (INDEPENDENT_AMBULATORY_CARE_PROVIDER_SITE_OTHER): Payer: Commercial Managed Care - PPO | Admitting: Professional

## 2020-08-08 DIAGNOSIS — F321 Major depressive disorder, single episode, moderate: Secondary | ICD-10-CM | POA: Diagnosis not present

## 2020-08-15 ENCOUNTER — Ambulatory Visit: Payer: Commercial Managed Care - PPO | Admitting: Professional

## 2020-08-20 ENCOUNTER — Ambulatory Visit: Payer: Commercial Managed Care - PPO | Admitting: Professional

## 2020-09-03 ENCOUNTER — Ambulatory Visit: Payer: Commercial Managed Care - PPO | Admitting: Professional

## 2020-09-17 ENCOUNTER — Ambulatory Visit: Payer: Commercial Managed Care - PPO | Admitting: Professional

## 2020-09-19 ENCOUNTER — Other Ambulatory Visit: Payer: Self-pay | Admitting: Family Medicine

## 2020-09-19 DIAGNOSIS — I10 Essential (primary) hypertension: Secondary | ICD-10-CM

## 2020-10-01 ENCOUNTER — Ambulatory Visit: Payer: Commercial Managed Care - PPO | Admitting: Professional

## 2020-10-15 ENCOUNTER — Ambulatory Visit: Payer: Commercial Managed Care - PPO | Admitting: Professional

## 2020-10-16 ENCOUNTER — Encounter: Payer: Self-pay | Admitting: Family Medicine

## 2020-12-18 ENCOUNTER — Encounter: Payer: Self-pay | Admitting: Family Medicine

## 2020-12-18 ENCOUNTER — Other Ambulatory Visit: Payer: Self-pay | Admitting: Family Medicine

## 2020-12-18 DIAGNOSIS — I10 Essential (primary) hypertension: Secondary | ICD-10-CM

## 2021-01-05 ENCOUNTER — Encounter: Payer: Self-pay | Admitting: Family Medicine

## 2021-01-27 ENCOUNTER — Inpatient Hospital Stay (HOSPITAL_BASED_OUTPATIENT_CLINIC_OR_DEPARTMENT_OTHER)
Admission: EM | Admit: 2021-01-27 | Discharge: 2021-02-01 | DRG: 308 | Disposition: A | Discharge status: Home / Self Care | Payer: Commercial Managed Care - PPO | Attending: Internal Medicine | Admitting: Internal Medicine

## 2021-01-27 ENCOUNTER — Emergency Department (HOSPITAL_BASED_OUTPATIENT_CLINIC_OR_DEPARTMENT_OTHER): Payer: Commercial Managed Care - PPO

## 2021-01-27 ENCOUNTER — Telehealth: Payer: Self-pay

## 2021-01-27 ENCOUNTER — Encounter (HOSPITAL_BASED_OUTPATIENT_CLINIC_OR_DEPARTMENT_OTHER): Payer: Self-pay

## 2021-01-27 ENCOUNTER — Other Ambulatory Visit: Payer: Self-pay

## 2021-01-27 DIAGNOSIS — N179 Acute kidney failure, unspecified: Secondary | ICD-10-CM | POA: Diagnosis not present

## 2021-01-27 DIAGNOSIS — I4891 Unspecified atrial fibrillation: Principal | ICD-10-CM | POA: Diagnosis present

## 2021-01-27 DIAGNOSIS — J810 Acute pulmonary edema: Secondary | ICD-10-CM | POA: Diagnosis present

## 2021-01-27 DIAGNOSIS — I351 Nonrheumatic aortic (valve) insufficiency: Secondary | ICD-10-CM | POA: Diagnosis present

## 2021-01-27 DIAGNOSIS — Z87891 Personal history of nicotine dependence: Secondary | ICD-10-CM

## 2021-01-27 DIAGNOSIS — I4729 Other ventricular tachycardia: Secondary | ICD-10-CM

## 2021-01-27 DIAGNOSIS — R7989 Other specified abnormal findings of blood chemistry: Secondary | ICD-10-CM | POA: Diagnosis not present

## 2021-01-27 DIAGNOSIS — F1021 Alcohol dependence, in remission: Secondary | ICD-10-CM | POA: Diagnosis present

## 2021-01-27 DIAGNOSIS — I472 Ventricular tachycardia: Secondary | ICD-10-CM | POA: Diagnosis present

## 2021-01-27 DIAGNOSIS — I5021 Acute systolic (congestive) heart failure: Secondary | ICD-10-CM | POA: Diagnosis not present

## 2021-01-27 DIAGNOSIS — I5043 Acute on chronic combined systolic (congestive) and diastolic (congestive) heart failure: Secondary | ICD-10-CM | POA: Diagnosis present

## 2021-01-27 DIAGNOSIS — I5041 Acute combined systolic (congestive) and diastolic (congestive) heart failure: Secondary | ICD-10-CM

## 2021-01-27 DIAGNOSIS — I248 Other forms of acute ischemic heart disease: Secondary | ICD-10-CM | POA: Diagnosis present

## 2021-01-27 DIAGNOSIS — Z79899 Other long term (current) drug therapy: Secondary | ICD-10-CM

## 2021-01-27 DIAGNOSIS — Z6841 Body Mass Index (BMI) 40.0 and over, adult: Secondary | ICD-10-CM | POA: Diagnosis not present

## 2021-01-27 DIAGNOSIS — I110 Hypertensive heart disease with heart failure: Secondary | ICD-10-CM | POA: Diagnosis present

## 2021-01-27 DIAGNOSIS — I10 Essential (primary) hypertension: Secondary | ICD-10-CM | POA: Diagnosis not present

## 2021-01-27 DIAGNOSIS — I4819 Other persistent atrial fibrillation: Principal | ICD-10-CM | POA: Diagnosis present

## 2021-01-27 DIAGNOSIS — Z20822 Contact with and (suspected) exposure to covid-19: Secondary | ICD-10-CM | POA: Diagnosis present

## 2021-01-27 DIAGNOSIS — I340 Nonrheumatic mitral (valve) insufficiency: Secondary | ICD-10-CM | POA: Diagnosis not present

## 2021-01-27 DIAGNOSIS — I5042 Chronic combined systolic (congestive) and diastolic (congestive) heart failure: Secondary | ICD-10-CM | POA: Diagnosis not present

## 2021-01-27 DIAGNOSIS — I361 Nonrheumatic tricuspid (valve) insufficiency: Secondary | ICD-10-CM | POA: Diagnosis not present

## 2021-01-27 DIAGNOSIS — I420 Dilated cardiomyopathy: Secondary | ICD-10-CM | POA: Diagnosis not present

## 2021-01-27 HISTORY — DX: Essential (primary) hypertension: I10

## 2021-01-27 LAB — COMPREHENSIVE METABOLIC PANEL
ALT: 34 U/L (ref 0–44)
AST: 25 U/L (ref 15–41)
Albumin: 4.2 g/dL (ref 3.5–5.0)
Alkaline Phosphatase: 48 U/L (ref 38–126)
Anion gap: 12 (ref 5–15)
BUN: 15 mg/dL (ref 6–20)
CO2: 21 mmol/L — ABNORMAL LOW (ref 22–32)
Calcium: 10 mg/dL (ref 8.9–10.3)
Chloride: 103 mmol/L (ref 98–111)
Creatinine, Ser: 1.06 mg/dL (ref 0.61–1.24)
GFR, Estimated: >60 mL/min (ref >60)
Glucose, Bld: 188 mg/dL — ABNORMAL HIGH (ref 70–99)
Potassium: 3.8 mmol/L (ref 3.5–5.1)
Sodium: 136 mmol/L (ref 135–145)
Total Bilirubin: 0.5 mg/dL (ref 0.3–1.2)
Total Protein: 7.7 g/dL (ref 6.5–8.1)

## 2021-01-27 LAB — CBC WITH DIFFERENTIAL/PLATELET
Abs Immature Granulocytes: 0.02 10*3/uL (ref 0.00–0.07)
Basophils Absolute: 0 10*3/uL (ref 0.0–0.1)
Basophils Relative: 0 %
Eosinophils Absolute: 0.1 10*3/uL (ref 0.0–0.5)
Eosinophils Relative: 1 %
HCT: 44.8 % (ref 39.0–52.0)
Hemoglobin: 15.2 g/dL (ref 13.0–17.0)
Immature Granulocytes: 0 %
Lymphocytes Relative: 27 %
Lymphs Abs: 2.5 10*3/uL (ref 0.7–4.0)
MCH: 30.9 pg (ref 26.0–34.0)
MCHC: 33.9 g/dL (ref 30.0–36.0)
MCV: 91.1 fL (ref 80.0–100.0)
Monocytes Absolute: 0.8 10*3/uL (ref 0.1–1.0)
Monocytes Relative: 8 %
Neutro Abs: 5.9 10*3/uL (ref 1.7–7.7)
Neutrophils Relative %: 64 %
Platelets: 250 10*3/uL (ref 150–400)
RBC: 4.92 MIL/uL (ref 4.22–5.81)
RDW: 15.1 % (ref 11.5–15.5)
WBC: 9.4 10*3/uL (ref 4.0–10.5)
nRBC: 0 % (ref 0.0–0.2)

## 2021-01-27 LAB — RESP PANEL BY RT-PCR (FLU A&B, COVID) ARPGX2
Influenza A by PCR: NEGATIVE
Influenza B by PCR: NEGATIVE
SARS Coronavirus 2 by RT PCR: NEGATIVE

## 2021-01-27 LAB — RAPID URINE DRUG SCREEN, HOSP PERFORMED
Amphetamines: NOT DETECTED
Barbiturates: NOT DETECTED
Benzodiazepines: NOT DETECTED
Cocaine: NOT DETECTED
Opiates: NOT DETECTED
Tetrahydrocannabinol: NOT DETECTED

## 2021-01-27 LAB — TROPONIN I (HIGH SENSITIVITY)
Troponin I (High Sensitivity): 47 ng/L — ABNORMAL HIGH (ref <18)
Troponin I (High Sensitivity): 47 ng/L — ABNORMAL HIGH (ref <18)

## 2021-01-27 LAB — BRAIN NATRIURETIC PEPTIDE: B Natriuretic Peptide: 475.5 pg/mL — ABNORMAL HIGH (ref 0.0–100.0)

## 2021-01-27 LAB — MAGNESIUM: Magnesium: 1.9 mg/dL (ref 1.7–2.4)

## 2021-01-27 MED ORDER — ONDANSETRON HCL 4 MG/2ML IJ SOLN: 4.0000 mg | Freq: Four times a day (QID) | INTRAMUSCULAR | Status: DC | PRN

## 2021-01-27 MED ORDER — NITROGLYCERIN IN D5W 200-5 MCG/ML-% IV SOLN
0.0000 ug/min | INTRAVENOUS | Status: DC
  Filled 2021-01-27: qty 250

## 2021-01-27 MED ORDER — ASPIRIN 81 MG PO CHEW
324.0000 mg | CHEWABLE_TABLET | Freq: Once | ORAL | Status: AC
  Administered 2021-01-27: 324 mg via ORAL
  Filled 2021-01-27: qty 4

## 2021-01-27 MED ORDER — METOPROLOL TARTRATE 5 MG/5ML IV SOLN
5.0000 mg | Freq: Once | INTRAVENOUS | Status: AC
  Administered 2021-01-27: 5 mg via INTRAVENOUS
  Filled 2021-01-27: qty 5

## 2021-01-27 MED ORDER — NITROGLYCERIN 0.4 MG SL SUBL: 0.4000 mg | SUBLINGUAL_TABLET | SUBLINGUAL | Status: DC | PRN

## 2021-01-27 MED ORDER — APIXABAN 5 MG PO TABS
5.0000 mg | ORAL_TABLET | Freq: Two times a day (BID) | ORAL | Status: DC
  Administered 2021-01-27 – 2021-02-01 (×10): 5 mg via ORAL
  Filled 2021-01-27 (×10): qty 1

## 2021-01-27 MED ORDER — DILTIAZEM HCL-DEXTROSE 125-5 MG/125ML-% IV SOLN (PREMIX)
5.0000 mg/h | INTRAVENOUS | Status: DC
  Administered 2021-01-27: 5 mg/h via INTRAVENOUS
  Filled 2021-01-27 (×4): qty 125

## 2021-01-27 MED ORDER — ONDANSETRON HCL 4 MG/2ML IJ SOLN
4.0000 mg | Freq: Once | INTRAMUSCULAR | Status: AC
  Administered 2021-01-27: 4 mg via INTRAVENOUS
  Filled 2021-01-27: qty 2

## 2021-01-27 MED ORDER — DILTIAZEM HCL 25 MG/5ML IV SOLN: 20.0000 mg | Freq: Once | INTRAVENOUS | Status: AC

## 2021-01-27 MED ORDER — FUROSEMIDE 10 MG/ML IJ SOLN
40.0000 mg | Freq: Once | INTRAMUSCULAR | Status: AC
  Administered 2021-01-27: 40 mg via INTRAVENOUS
  Filled 2021-01-27: qty 4

## 2021-01-27 MED ORDER — DILTIAZEM HCL 25 MG/5ML IV SOLN
INTRAVENOUS | Status: AC
  Administered 2021-01-27: 20 mg via INTRAVENOUS
  Filled 2021-01-27: qty 5

## 2021-01-27 MED ORDER — ACETAMINOPHEN 325 MG PO TABS: 650.0000 mg | ORAL_TABLET | ORAL | Status: DC | PRN

## 2021-01-27 NOTE — ED Triage Notes (Signed)
Pt arrives with c/o intermittent shortness of breath, worse with exertion for the past 2 days. HR between 165-180. Denies chest pain or dizziness.

## 2021-01-27 NOTE — ED Notes (Signed)
Placed patient on BiPAP per MD order. 12/6 and 40%. Patient seems to be tolerating well. RT to monitor.

## 2021-01-27 NOTE — Telephone Encounter (Signed)
Pt called and stated he was short of breath and has a "weird feeling". Pt states he has also felt light headed. Pt denies chest pain, palpitations, slurred speech or weakness. Pt requested to see PCP, pt's PCP is not here today. Due to pt's medical history and symptoms, advised pt to go to the ER. Pt stated he would go to urgent care.

## 2021-01-27 NOTE — Progress Notes (Signed)
Pt in no distress at this time requiring bipap.  Pt on 2LPM [place] and sats at 95% and no increase WOB or SOB.  RT will continue to monitor.

## 2021-01-27 NOTE — ED Notes (Signed)
Pt placed on BiPAP at this time. PA and RT at bedside

## 2021-01-27 NOTE — ED Notes (Signed)
Helped Pt. Stand to use urinal then back to stretcher.

## 2021-01-27 NOTE — ED Notes (Signed)
Patient currently off BiPAP and doing well, but report called to [facility] RT just in case he needs to go back on. RT will continue to monitor.

## 2021-01-27 NOTE — ED Notes (Signed)
Pt. Is alert and oriented with no c/o pain.  Pt. Has been asked multiple times if he would like anyone called and he cont. To state "no not at this time".  Pt. HR cont to be elevated at 165 then down to 145 back up to 175 reported to EDP and will start a cardizem gtt.  Pt. Has tolerated care.

## 2021-01-27 NOTE — ED Notes (Signed)
Patient being trialled off BiPAP. MD and RN aware. Placed on 4LNC. RT will continue to monitor.

## 2021-01-27 NOTE — ED Provider Notes (Signed)
[facility] EMERGENCY DEPARTMENT Provider Note   CSN: [id number] Arrival date & time: 01/27/21  1334     History Chief Complaint  Patient presents with  . Shortness of Breath    Joe Morrison is a 55 y.o. male.  HPI      Joe Morrison is a 55 y.o. male, with a history of hypertension, obesity, alcoholism in recovery, presenting to the ED with shortness of breath over the last couple weeks, significantly worsened over the last 2 days. He endorses intermittent shortness of breath he has had for years that he has attributed to his weight.  He has also had lower extremity edema "for a long time." He has been compliant with his antihypertensive medications. He has had 2 doses of COVID vaccination.  He drinks caffeine throughout the day.  Denies illicit drug use. Denies fever/chills, syncope, dizziness, chest pain, abdominal pain, calf pain, N/V/D, or any other complaints.     Past Medical History:  Diagnosis Date  . Alcoholic (HCC)    recovering since 10/1999  . Hypertension     Patient Active Problem List   Diagnosis Date Noted  . Atrial fibrillation with RVR (HCC) 01/27/2021  . Depression, major, single episode, mild (HCC) 06/24/2020  . Severe obesity (BMI >= 40) (HCC) 10/23/2013  . ED (erectile dysfunction) 05/02/2012  . HYPERTENSION, BENIGN SYSTEMIC 07/20/2006    Past Surgical History:  Procedure Laterality Date  . MOUTH SURGERY     Dental work  . TONSILLECTOMY  1972       Family History  Problem Relation Age of Onset  . Diabetes Mother   . Hypertension Mother   . Heart attack Father 45  . Alcohol abuse Other     Social History   Tobacco Use  . Smoking status: Former Smoker    Quit date: 04/27/1999    Years since quitting: 21.7  . Smokeless tobacco: Never Used  Substance Use Topics  . Alcohol use: No    Comment: Former Advice worker in [place]  . Drug use: No    Home Medications Prior to Admission medications   Medication Sig  Start Date End Date Taking? Authorizing Provider  metoprolol succinate (TOPROL-XL) 50 MG 24 hr tablet TAKE 1 TABLET(50 MG) BY MOUTH DAILY 06/21/20  Yes Agapito Games, MD  Olmesartan-amLODIPine-HCTZ 40-5-12.5 MG TABS TAKE 1 TABLET BY MOUTH DAILY 12/18/20  Yes Agapito Games, MD    Allergies    Patient has no known allergies.  Review of Systems   Review of Systems  Constitutional: Negative for chills, diaphoresis and fever.  Respiratory: Positive for shortness of breath. Negative for cough.   Cardiovascular: Positive for leg swelling. Negative for chest pain.  Gastrointestinal: Negative for nausea and vomiting.  Musculoskeletal: Negative for back pain.  Neurological: Negative for dizziness, syncope and weakness.  All other systems reviewed and are negative.   Physical Exam Updated Vital Signs BP 140/81   Pulse (!) 143   Temp 98.2 F (36.8 C) (Oral)   Resp (!) 22   Ht 5\' 9"  (1.753 m)   Wt (!) 152 kg   SpO2 96%   BMI 49.47 kg/m   Physical Exam Vitals and nursing note reviewed.  Constitutional:      General: He is in acute distress.     Appearance: He is well-developed. He is obese. He is ill-appearing.  HENT:     Head: Normocephalic and atraumatic.     Mouth/Throat:     Mouth: Mucous  membranes are moist.     Pharynx: Oropharynx is clear.  Eyes:     Conjunctiva/sclera: Conjunctivae normal.  Cardiovascular:     Rate and Rhythm: Tachycardia present. Rhythm irregularly irregular.     Pulses: Normal pulses.          Radial pulses are 2+ on the right side and 2+ on the left side.       Posterior tibial pulses are 2+ on the right side and 2+ on the left side.     Heart sounds: Normal heart sounds.     Comments: Tactile temperature in the extremities appropriate and equal bilaterally. Pulmonary:     Effort: Tachypnea present.     Breath sounds: Examination of the right-lower field reveals rales. Examination of the left-lower field reveals rales. Rales present.      Comments: Tachypnea and increased work of breathing. Initially, SPO2 at 95 to 96% while at rest, however, drops to 85% with movement in the bed or mild exertion. Abdominal:     Palpations: Abdomen is soft.     Tenderness: There is no abdominal tenderness. There is no guarding.  Musculoskeletal:     Cervical back: Neck supple.     Right lower leg: 1+ Pitting Edema present.     Left lower leg: 1+ Pitting Edema present.  Skin:    General: Skin is warm and dry.  Neurological:     Mental Status: He is alert.  Psychiatric:        Mood and Affect: Mood and affect normal.        Speech: Speech normal.        Behavior: Behavior normal.     ED Results / Procedures / Treatments   Labs (all labs ordered are listed, but only abnormal results are displayed) Labs Reviewed  COMPREHENSIVE METABOLIC PANEL - Abnormal; Notable for the following components:      Result Value   CO2 21 (*)    Glucose, Bld 188 (*)    All other components within normal limits  BRAIN NATRIURETIC PEPTIDE - Abnormal; Notable for the following components:   B Natriuretic Peptide 475.5 (*)    All other components within normal limits  TROPONIN I (HIGH SENSITIVITY) - Abnormal; Notable for the following components:   Troponin I (High Sensitivity) 47 (*)    All other components within normal limits  TROPONIN I (HIGH SENSITIVITY) - Abnormal; Notable for the following components:   Troponin I (High Sensitivity) 47 (*)    All other components within normal limits  RESP PANEL BY RT-PCR (FLU A&B, COVID) ARPGX2  CBC WITH DIFFERENTIAL/PLATELET  RAPID URINE DRUG SCREEN, HOSP PERFORMED  MAGNESIUM    EKG EKG Interpretation  Date/Time:  Monday January 27 2021 14:35:44 EDT Ventricular Rate:  152 PR Interval:    QRS Duration: 102 QT Interval:  291 QTC Calculation: 463 R Axis:   75 Text Interpretation: Atrial fibrillation Low voltage, precordial leads Borderline repolarization abnormality Premature ventricular complexes normal  axis Confirmed by Pieter Partridge ([id number]) on 01/27/2021 3:36:06 PM   Radiology DG Chest Portable 1 View  Result Date: 01/27/2021 CLINICAL DATA:  Short of breath EXAM: PORTABLE CHEST 1 VIEW COMPARISON:  None. FINDINGS: Cardiac enlargement with vascular congestion. Probable mild interstitial edema. No significant effusion. Mild bibasilar atelectasis. IMPRESSION: Cardiac enlargement with mild vascular congestion and interstitial edema. Electronically Signed   By: Marlan Palau M.D.   On: 01/27/2021 14:50    Procedures .Critical Care Performed by: Anselm Pancoast, PA-C Authorized  by: Anselm Pancoast, PA-C   Critical care provider statement:    Critical care time (minutes):  60   Critical care time was exclusive of:  Separately billable procedures and treating other patients   Critical care was necessary to treat or prevent imminent or life-threatening deterioration of the following conditions:  Cardiac failure and respiratory failure     Medications Ordered in ED Medications  nitroGLYCERIN 50 mg in dextrose 5 % 250 mL (0.2 mg/mL) infusion (0 mcg/min Intravenous Hold 01/27/21 1426)  diltiazem (CARDIZEM) 125 mg in dextrose 5% 125 mL (1 mg/mL) infusion (10 mg/hr Intravenous Rate/Dose Change 01/27/21 1515)  diltiazem (CARDIZEM) injection 20 mg (20 mg Intravenous Given 01/27/21 1414)  aspirin chewable tablet 324 mg (324 mg Oral Given 01/27/21 1420)  furosemide (LASIX) injection 40 mg (40 mg Intravenous Given 01/27/21 1449)  ondansetron (ZOFRAN) injection 4 mg (4 mg Intravenous Given 01/27/21 1546)  metoprolol tartrate (LOPRESSOR) injection 5 mg (5 mg Intravenous Given 01/27/21 1555)    ED Course  I have reviewed the triage vital signs and the nursing notes.  Pertinent labs & imaging results that were available during my care of the patient were reviewed by me and considered in my medical decision making (see chart for details).  Clinical Course as of 01/27/21 1742  Mon Jan 27, 2021  1425 When patient's  heart rate improved down to around 130 following the Cardizem bolus, rhythm appears to be consistent with atrial fibrillation with RVR. [SJ]  1501 Patient tolerating BiPAP well.  States his breathing has significantly improved. [SJ]  1535 Spoke with Dr. Tenny Craw, cardiologist. Discussed and acknowledged the already running Cardizem infusion.  States we should also give a dose of 5 mg metoprolol IV. [SJ]  1703 Patient on a trial off BiPAP.  Denies any current shortness of breath.  SPO2 94-95%. [SJ]    Clinical Course User Index [SJ] Tymesha Ditmore C, PA-C   MDM Rules/Calculators/A&P     CHA2DS2-VASc Score: 1                     Patient presents with shortness of breath. Significant tachycardia into the 170s with hypertension.  Rales on exam. Suspicion for onset of heart failure with A. fib RVR.  A. fib for this patient would be new onset. Heart rate improvement with Cardizem, requiring consistent Cardizem infusion. Symptoms improved with heart rate management. Mildly elevated troponin expected with this presentation. Patient admitted for further management.   Findings and plan of care discussed with attending physician, Pieter Partridge, MD. Dr. Delford Field personally evaluated and examined this patient.   Vitals:   01/27/21 1600 01/27/21 1643 01/27/21 1700 01/27/21 1715  BP: 98/82  117/80 (!) 126/91  Pulse: (!) 123 (!) 109 (!) 109 (!) 105  Resp: 17 18 17 18   Temp:      TempSrc:      SpO2: 94% 95% 93% 94%  Weight:      Height:         Final Clinical Impression(s) / ED Diagnoses Final diagnoses:  Atrial fibrillation with RVR (HCC)  New onset atrial fibrillation (HCC)  Acute pulmonary edema [facility])    Rx / DC Orders ED Discharge Orders    None       [facility] 01/27/21 1744    01/29/21, MD 01/28/21 [phone]

## 2021-01-27 NOTE — H&P (Addendum)
Cardiology Admission History and Physical:   Patient ID: Joe Morrison MRN: [id number]; DOB: [birth year]   Admission date: 01/27/2021  PCP:  Agapito Games, MD   [facility]  Cardiologist: New   Chief Complaint: SOB  Patient Profile:   Joe Morrison is a 55 y.o. male with history of hypertension, obesity and prior alcoholism transferred [facility] for atrial fibrillation.  History of Present Illness:   Joe Morrison went to [facility] with intermittent shortness of breath which worsened with exertion.  Patient reports that he has chronic shortness of breath due to morbid obesity.  However, 2 weeks ago he had more than usual shortness of breath for 2 days.  He was orthopneic at that time.  Symptoms resolved however reoccurred 4 days ago and persisted.  And came this is progressively getting worse and came to ER for further evaluation.  Denies exertional chest tightness or palpitation. Reports lower extremity edema and fatigue.  He was found in A. fib RVR at heart rate of 170 bpm.  He was placed on BiPAP for 2 hours and then transition to nasal cannula.  He was given IV Cardizem 20 mg x 1, IV Lopressor 5 mg x 1 and IV Lasix 40 mg x 1.  He is started on diltiazem drip and transferred to [facility] for admission under cardiology service.  BNP 475.5 HS-troponin 47>>47 Scr 1.06 UDS clear  CXR with Cardiac enlargement with mild vascular congestion and interstitial edema.  Denies illicit drug use, tobacco smoking or alcohol use.  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  He feels better when does exercise.  No palpitation, dizziness, PND, syncope, melena or blood in his stool or urine   Past Medical History:  Diagnosis Date  . Alcoholic (HCC)    recovering since 10/1999  . Hypertension     Past Surgical History:  Procedure Laterality Date  . MOUTH SURGERY     Dental work  .  TONSILLECTOMY  1972     Medications Prior to Admission: Prior to Admission medications   Medication Sig Start Date End Date Taking? Authorizing Provider  metoprolol succinate (TOPROL-XL) 50 MG 24 hr tablet TAKE 1 TABLET(50 MG) BY MOUTH DAILY 06/21/20  Yes Agapito Games, MD  Olmesartan-amLODIPine-HCTZ 40-5-12.5 MG TABS TAKE 1 TABLET BY MOUTH DAILY 12/18/20  Yes Agapito Games, MD     Allergies:   No Known Allergies  Social History:   Social History   Socioeconomic History  . Marital status: Single    Spouse name: Not on file  . Number of children: 0  . Years of education: Not on file  . Highest education level: Not on file  Occupational History  . Not on file  Tobacco Use  . Smoking status: Former Smoker    Quit date: 04/27/1999    Years since quitting: 21.7  . Smokeless tobacco: Never Used  Substance and Sexual Activity  . 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 Drug use: No  . Sexual activity: Not Currently    Partners: Male  Other Topics Concern  . Not on file  Social History Narrative   Working out daily with a Psychologist, educational. No partner   Social Determinants of Corporate investment banker Strain: Not on file  Food Insecurity: Not on file  Transportation Needs: Not on file  Physical Activity: Not on file  Stress:  Not on file  Social Connections: Not on file  Intimate Partner Violence: Not on file    Family History: The patient's family history includes Alcohol abuse in an other family member; Diabetes in his mother; Heart attack (age of onset: 69) in his father; Hypertension in his mother.    ROS:  Please see the history of present illness.  All other ROS reviewed and negative.     Physical Exam/Data:   Vitals:   01/27/21 1600 01/27/21 1643 01/27/21 1700 01/27/21 1715  BP: 98/82  117/80 (!) 126/91  Pulse: (!) 123 (!) 109 (!) 109 (!) 105  Resp: 17 18 17 18   Temp:      TempSrc:      SpO2: 94% 95% 93% 94%  Weight:       Height:        Intake/Output Summary (Last 24 hours) at 01/27/2021 1815 Last data filed at 01/27/2021 1545 Gross per 24 hour  Intake --  Output 500 ml  Net -500 ml   Last 3 Weights 01/27/2021 06/24/2020 12/19/2018  Weight (lbs) 335 lb 320 lb 359 lb  Weight (kg) 151.955 kg 145.151 kg 162.841 kg     Body mass index is 49.47 kg/m.  General: Obese male  in no acute distress HEENT: normal Lymph: no adenopathy Neck: no  JVD Endocrine:  No thryomegaly Vascular: No carotid bruits; FA pulses 2+ bilaterally without bruits  Cardiac:  normal S1, S2; RRR; no murmur  Lungs:  clear to auscultation bilaterally, no wheezing, rhonchi or rales  Abd: soft, nontender, no hepatomegaly  Ext: 1 + BL edema Musculoskeletal:  No deformities, BUE and BLE strength normal and equal Skin: warm and dry  Neuro:  CNs 2-12 intact, no focal abnormalities noted Psych:  Normal affect   EKG:  The ECG that was done  was personally reviewed and demonstrates AFib 170 bpm  Relevant CV Studies: N/A  Laboratory Data:  High Sensitivity Troponin:   Recent Labs  Lab 01/27/21 1355 01/27/21 1614  TROPONINIHS 47* 47*      Chemistry Recent Labs  Lab 01/27/21 1355  NA 136  K 3.8  CL 103  CO2 21*  GLUCOSE 188*  BUN 15  CREATININE 1.06  CALCIUM 10.0  GFRNONAA >60  ANIONGAP 12    Recent Labs  Lab 01/27/21 1355  PROT 7.7  ALBUMIN 4.2  AST 25  ALT 34  ALKPHOS 48  BILITOT 0.5   Hematology Recent Labs  Lab 01/27/21 1355  WBC 9.4  RBC 4.92  HGB 15.2  HCT 44.8  MCV 91.1  MCH 30.9  MCHC 33.9  RDW 15.1  PLT 250   BNP Recent Labs  Lab 01/27/21 1355  BNP 475.5*     Radiology/Studies:  DG Chest Portable 1 View  Result Date: 01/27/2021 CLINICAL DATA:  Short of breath EXAM: PORTABLE CHEST 1 VIEW COMPARISON:  None. FINDINGS: Cardiac enlargement with vascular congestion. Probable mild interstitial edema. No significant effusion. Mild bibasilar atelectasis. IMPRESSION: Cardiac enlargement with mild  vascular congestion and interstitial edema. Electronically Signed   By: 01/29/2021 M.D.   On: 01/27/2021 14:50   Assessment and Plan:   1. New onset afib RVR - HR improving on cardizem drip>> will continue (up-titrate) - Check TSH and echo  - Start Eliquis -Patient is symptomatic with elevated heart rate.  May need TEE cardioversion on Wednesday if not self convert.   2. Pulmonary edema/bilateral lower extremity edema - CXR with Cardiac enlargement with mild vascular congestion  and interstitial edema. - BNP 475.5 - Likely due to elevated HR - He was given IV Lasix 40 mg in ER - Pending echo - good diuresis, reassess lasix in AM  3. Elevated Troponin - HS-troponin 47>>47 (flat) - due to demand ischemia from elevated HR  4. Morbid obesity -Patient reports prior history of snoring.  5. HTN - Hold home meds - titrate cardizem   Risk Assessment/Risk Scores:   New York Heart Association (NYHA) Functional Class NYHA Class I  CHA2DS2-VASc Score = 1  This indicates a 0.6% annual risk of stroke. The patient's score is based upon: CHF History: No HTN History: Yes Diabetes History: No Stroke History: No Vascular Disease History: No Age Score: 0 Gender Score: 0     Score is 2 if CHF  Severity of Illness: The appropriate patient status for this patient is INPATIENT. Inpatient status is judged to be reasonable and necessary in order to provide the required intensity of service to ensure the patient's safety. The patient's presenting symptoms, physical exam findings, and initial radiographic and laboratory data in the context of their chronic comorbidities is felt to place them at high risk for further clinical deterioration. Furthermore, it is not anticipated that the patient will be medically stable for discharge from the hospital within 2 midnights of admission. The following factors support the patient status of inpatient.   " The patient's presenting symptoms include   SOB. " The worrisome physical exam findings include volume overload  " The initial radiographic and laboratory data are worrisome because of Elevated BNP and troponin. " The chronic co-morbidities include obesity and HTN   * I certify that at the point of admission it is my clinical judgment that the patient will require inpatient hospital care spanning beyond 2 midnights from the point of admission due to high intensity of service, high risk for further deterioration and high frequency of surveillance required.*    For questions or updates, please contact [facility] Please consult [email or web address] for contact info under     Lorelei Pont, [place]  01/27/2021 6:15 PM   Patient seen and examined and agree with Chelsea Aus, PA-C as detailed above.  In brief, the patient is a 55 year old male with history of HTN, morbid obesity, and prior alcoholism who presented to [facility] found to have newly diagnosed Afib with RVR and respiratory distress initially requiring BiPAP prompting transfer to [facility] for further management.   At [facility], BNP 475, trop flat, ECG with Afib with RVR with HR 170. He was given dilt bolus and gtt and lasix 40mg  IV with improvement. Currently, HR 100-110s. Off BiPAP and weaned to [place]. Currently asymptomatic with no chest pain or SOB at rest. No orthopnea currently. Trace LE edema. No known history of CAD or arrhythmias.  GEN: No acute distress.   Neck: Cannon a-waves present Cardiac: Irregular, tachycardic, no murmur Respiratory: Clear to auscultation bilaterally. GI: Obese, soft, nontender, non-distended  MS: Trace edema; No deformity. Neuro:  Nonfocal  Psych: Normal affect   Plan: -Continue dilt gtt; can up-titrate as needed -Will start AC with apixaban (CHADs-vasc 1) in case plans for DCCV in future or TEE/DCCV during admission -Check TTE -Check TSH -S/p lasix 40mg  IV; reassess volume status in the morning and re-dose as needed -Will need  sleep study as out-patient   , MD

## 2021-01-28 ENCOUNTER — Inpatient Hospital Stay (HOSPITAL_COMMUNITY): Payer: Commercial Managed Care - PPO

## 2021-01-28 ENCOUNTER — Other Ambulatory Visit (HOSPITAL_COMMUNITY): Payer: Self-pay

## 2021-01-28 DIAGNOSIS — I4891 Unspecified atrial fibrillation: Secondary | ICD-10-CM | POA: Diagnosis not present

## 2021-01-28 LAB — LIPID PANEL
Cholesterol: 153 mg/dL (ref 0–200)
HDL: 28 mg/dL — ABNORMAL LOW (ref >40)
LDL Cholesterol: 92 mg/dL (ref 0–99)
Total CHOL/HDL Ratio: 5.5 RATIO
Triglycerides: 165 mg/dL — ABNORMAL HIGH (ref <150)
VLDL: 33 mg/dL (ref 0–40)

## 2021-01-28 LAB — CBC
HCT: 41.4 % (ref 39.0–52.0)
Hemoglobin: 13.8 g/dL (ref 13.0–17.0)
MCH: 31.2 pg (ref 26.0–34.0)
MCHC: 33.3 g/dL (ref 30.0–36.0)
MCV: 93.5 fL (ref 80.0–100.0)
Platelets: 199 10*3/uL (ref 150–400)
RBC: 4.43 MIL/uL (ref 4.22–5.81)
RDW: 15.3 % (ref 11.5–15.5)
WBC: 7.5 10*3/uL (ref 4.0–10.5)
nRBC: 0 % (ref 0.0–0.2)

## 2021-01-28 LAB — HEMOGLOBIN A1C
Hgb A1c MFr Bld: 4.7 % — ABNORMAL LOW (ref 4.8–5.6)
Mean Plasma Glucose: 88.19 mg/dL

## 2021-01-28 LAB — BASIC METABOLIC PANEL
Anion gap: 8 (ref 5–15)
BUN: 14 mg/dL (ref 6–20)
CO2: 24 mmol/L (ref 22–32)
Calcium: 9.8 mg/dL (ref 8.9–10.3)
Chloride: 109 mmol/L (ref 98–111)
Creatinine, Ser: 1.23 mg/dL (ref 0.61–1.24)
GFR, Estimated: >60 mL/min (ref >60)
Glucose, Bld: 116 mg/dL — ABNORMAL HIGH (ref 70–99)
Potassium: 3.9 mmol/L (ref 3.5–5.1)
Sodium: 141 mmol/L (ref 135–145)

## 2021-01-28 LAB — ECHOCARDIOGRAM COMPLETE
AR max vel: 4.78 cm2
AV Area VTI: 4.95 cm2
AV Area mean vel: 4.98 cm2
AV Mean grad: 6 mmHg
AV Peak grad: 11 mmHg
Ao pk vel: 1.66 m/s
Area-P 1/2: 6.51 cm2
Height: 69 in
S' Lateral: 4.8 cm
Weight: 5360 oz

## 2021-01-28 LAB — HIV ANTIBODY (ROUTINE TESTING W REFLEX): HIV Screen 4th Generation wRfx: NONREACTIVE

## 2021-01-28 LAB — TSH: TSH: 2.382 u[IU]/mL (ref 0.350–4.500)

## 2021-01-28 MED ORDER — PERFLUTREN LIPID MICROSPHERE
1.0000 mL | INTRAVENOUS | Status: AC | PRN
  Administered 2021-01-28: 2 mL via INTRAVENOUS
  Filled 2021-01-28: qty 10

## 2021-01-28 MED ORDER — SODIUM CHLORIDE 0.9 % IV SOLN: INTRAVENOUS | Status: DC

## 2021-01-28 NOTE — TOC Benefit Eligibility Note (Signed)
Patient Product/process development scientist completed.    The patient is currently admitted and upon discharge could be taking Eliquis 5 mg.  The current 30 day co-pay is, $509.20 due to a $1,750.00 deductible.   The patient is currently admitted and upon discharge could be taking Entresto 24mg /26mg .  The current 30 day co-pay is, $600.27 due to a $1,750.00 deductible.   The patient is currently admitted and upon discharge could be taking Farxiga 10 mg.  The current 30 day co-pay is, $528.27 due to a $1,750.00 deductible.   The patient is currently admitted and upon discharge could be taking Jardiance 10 mg.  The current 30 day co-pay is, $549.09 due to a $1,750.00 deductible.   The patient is insured through     [employer], CPhT Pharmacy Patient Advocate Specialist [facility] Antimicrobial Stewardship Team Direct Number: [phone]  Fax: [phone]

## 2021-01-28 NOTE — Discharge Instructions (Addendum)

## 2021-01-28 NOTE — Progress Notes (Addendum)
Progress Note  Patient Name: Joe Morrison Date of Encounter: 01/28/2021  [facility] Cardiologist: Meriam Sprague, MD  Subjective   Breathing improving. No chest pain or palpitations.   Inpatient Medications    Scheduled Meds: . apixaban  5 mg Oral BID   Continuous Infusions: . diltiazem (CARDIZEM) infusion 10 mg/hr (01/27/21 1515)  . nitroGLYCERIN Stopped (01/27/21 1426)   PRN Meds: acetaminophen, nitroGLYCERIN, ondansetron (ZOFRAN) IV   Vital Signs    Vitals:   01/27/21 1854 01/27/21 2110 01/27/21 2327 01/28/21 0353  BP: (!) 144/86 128/77 117/68 98/62  Pulse: (!) 110 91 80 74  Resp: 16 18 19 18   Temp: 98.6 F (37 C) 97.7 F (36.5 C) 97.7 F (36.5 C) 97.7 F (36.5 C)  TempSrc: Oral Oral Oral Oral  SpO2: 94% 95% 97% 97%  Weight:      Height:        Intake/Output Summary (Last 24 hours) at 01/28/2021 0831 Last data filed at 01/27/2021 1545 Gross per 24 hour  Intake --  Output 500 ml  Net -500 ml   Last 3 Weights 01/27/2021 06/24/2020 12/19/2018  Weight (lbs) 335 lb 320 lb 359 lb  Weight (kg) 151.955 kg 145.151 kg 162.841 kg      Telemetry    afib 90-100s, PVCs - Personally Reviewed  ECG    No new tracing   Physical Exam   GEN: Obese male in no acute distress.   Neck: No JVD Cardiac: RRR, no murmurs, rubs, or gallops.  Respiratory: Clear to auscultation bilaterally. GI: Soft, nontender, non-distended  MS: Trace edema R > L; No deformity. Neuro:  Nonfocal  Psych: Normal affect   Labs    High Sensitivity Troponin:   Recent Labs  Lab 01/27/21 1355 01/27/21 1614  TROPONINIHS 47* 47*      Chemistry Recent Labs  Lab 01/27/21 1355 01/28/21 0039  NA 136 141  K 3.8 3.9  CL 103 109  CO2 21* 24  GLUCOSE 188* 116*  BUN 15 14  CREATININE 1.06 1.23  CALCIUM 10.0 9.8  PROT 7.7  --   ALBUMIN 4.2  --   AST 25  --   ALT 34  --   ALKPHOS 48  --   BILITOT 0.5  --   GFRNONAA >60 >60  ANIONGAP 12 8     Hematology Recent Labs   Lab 01/27/21 1355 01/28/21 0039  WBC 9.4 7.5  RBC 4.92 4.43  HGB 15.2 13.8  HCT 44.8 41.4  MCV 91.1 93.5  MCH 30.9 31.2  MCHC 33.9 33.3  RDW 15.1 15.3  PLT 250 199    BNP Recent Labs  Lab 01/27/21 1355  BNP 475.5*     DDimer No results for input(s): DDIMER in the last 168 hours.   Radiology    DG Chest Portable 1 View  Result Date: 01/27/2021 CLINICAL DATA:  Short of breath EXAM: PORTABLE CHEST 1 VIEW COMPARISON:  None. FINDINGS: Cardiac enlargement with vascular congestion. Probable mild interstitial edema. No significant effusion. Mild bibasilar atelectasis. IMPRESSION: Cardiac enlargement with mild vascular congestion and interstitial edema. Electronically Signed   By: 01/29/2021 M.D.   On: 01/27/2021 14:50    Cardiac Studies   Pending echo   Patient Profile     55 y.o. male with history of hypertension, obesity and prior alcoholism in 2001 transferred from [facility] for atrial fibrillation.  Assessment & Plan    1. New onset afib RVR - HR  improved on cardizem drip 10mg  /hr to 90-100s - Normal TSH - Continue Eliquis -Patient is symptomatic with elevated heart rate.  Will review TEE cardioversion tomorrow or DCCV in outpatient setting.   2. Pulmonary edema/bilateral lower extremity edema - CXR with Cardiac enlargement with mild vascular congestion and interstitial edema. - BNP 475.5 - Likely due to elevated HR - He was given IV Lasix 40 mg in ER with improved symptoms.  - Hold lasix this AM due to soft BP  3. Elevated Troponin - HS-troponin 47>>47 (flat) - due to demand ischemia from elevated HR  4. Morbid obesity/snoring  Plan outpatient sleep study  5. HTN - Held home meds - Continue cardizem gtt  For questions or updates, please contact [facility] Please consult [email or web address] for contact info under        Signed, , PA  01/28/2021, 8:31 AM    History and all data above reviewed.  Patient examined.   I agree with the findings as above.   Breathing OK.  No pain.  He does not notice his HR.  The patient exam reveals 01/30/2021   ,  Lungs: Clear  ,  Abd: Positive bowel sounds, no rebound no guarding, Ext Mild edema.    .  All available labs, radiology testing, previous records reviewed. Agree with documented assessment and plan.   Atrial fib:  I will plan TEE/DCCV tomorrow.  Continue anticoagulation and IV Dilt.  Echo is pending and other therapy will be based on this.  Holding off on diuretic for now as he is oxygenating well and BP is lower.   LPF:[id number] Charlottie Peragine  10:16 AM  01/28/2021

## 2021-01-28 NOTE — Progress Notes (Addendum)
Echocardiogram complete with Definity

## 2021-01-28 NOTE — H&P (View-Only) (Signed)
 Progress Note  Patient Name: Joe Morrison Date of Encounter: 01/28/2021  [facility] Cardiologist: Heather E Pemberton, MD  Subjective   Breathing improving. No chest pain or palpitations.   Inpatient Medications    Scheduled Meds: . apixaban  5 mg Oral BID   Continuous Infusions: . diltiazem (CARDIZEM) infusion 10 mg/hr (01/27/21 1515)  . nitroGLYCERIN Stopped (01/27/21 1426)   PRN Meds: acetaminophen, nitroGLYCERIN, ondansetron (ZOFRAN) IV   Vital Signs    Vitals:   01/27/21 1854 01/27/21 2110 01/27/21 2327 01/28/21 0353  BP: (!) 144/86 128/77 117/68 98/62  Pulse: (!) 110 91 80 74  Resp: 16 18 19 18  Temp: 98.6 F (37 C) 97.7 F (36.5 C) 97.7 F (36.5 C) 97.7 F (36.5 C)  TempSrc: Oral Oral Oral Oral  SpO2: 94% 95% 97% 97%  Weight:      Height:        Intake/Output Summary (Last 24 hours) at 01/28/2021 0831 Last data filed at 01/27/2021 1545 Gross per 24 hour  Intake --  Output 500 ml  Net -500 ml   Last 3 Weights 01/27/2021 06/24/2020 12/19/2018  Weight (lbs) 335 lb 320 lb 359 lb  Weight (kg) 151.955 kg 145.151 kg 162.841 kg      Telemetry    afib 90-100s, PVCs - Personally Reviewed  ECG    No new tracing   Physical Exam   GEN: Obese male in no acute distress.   Neck: No JVD Cardiac: RRR, no murmurs, rubs, or gallops.  Respiratory: Clear to auscultation bilaterally. GI: Soft, nontender, non-distended  MS: Trace edema R > L; No deformity. Neuro:  Nonfocal  Psych: Normal affect   Labs    High Sensitivity Troponin:   Recent Labs  Lab 01/27/21 1355 01/27/21 1614  TROPONINIHS 47* 47*      Chemistry Recent Labs  Lab 01/27/21 1355 01/28/21 0039  NA 136 141  K 3.8 3.9  CL 103 109  CO2 21* 24  GLUCOSE 188* 116*  BUN 15 14  CREATININE 1.06 1.23  CALCIUM 10.0 9.8  PROT 7.7  --   ALBUMIN 4.2  --   AST 25  --   ALT 34  --   ALKPHOS 48  --   BILITOT 0.5  --   GFRNONAA >60 >60  ANIONGAP 12 8     Hematology Recent Labs   Lab 01/27/21 1355 01/28/21 0039  WBC 9.4 7.5  RBC 4.92 4.43  HGB 15.2 13.8  HCT 44.8 41.4  MCV 91.1 93.5  MCH 30.9 31.2  MCHC 33.9 33.3  RDW 15.1 15.3  PLT 250 199    BNP Recent Labs  Lab 01/27/21 1355  BNP 475.5*     DDimer No results for input(s): DDIMER in the last 168 hours.   Radiology    DG Chest Portable 1 View  Result Date: 01/27/2021 CLINICAL DATA:  Short of breath EXAM: PORTABLE CHEST 1 VIEW COMPARISON:  None. FINDINGS: Cardiac enlargement with vascular congestion. Probable mild interstitial edema. No significant effusion. Mild bibasilar atelectasis. IMPRESSION: Cardiac enlargement with mild vascular congestion and interstitial edema. Electronically Signed   By: Charles  Clark M.D.   On: 01/27/2021 14:50    Cardiac Studies   Pending echo   Patient Profile     54 y.o. male with history of hypertension, obesity and prior alcoholism in 2001 transferred from [facility] for atrial fibrillation.  Assessment & Plan    1. New onset afib RVR - HR   improved on cardizem drip 10mg  /hr to 90-100s - Normal TSH - Continue Eliquis -Patient is symptomatic with elevated heart rate.  Will review TEE cardioversion tomorrow or DCCV in outpatient setting.   2. Pulmonary edema/bilateral lower extremity edema - CXR with Cardiac enlargement with mild vascular congestion and interstitial edema. - BNP 475.5 - Likely due to elevated HR - He was given IV Lasix 40 mg in ER with improved symptoms.  - Hold lasix this AM due to soft BP  3. Elevated Troponin - HS-troponin 47>>47 (flat) - due to demand ischemia from elevated HR  4. Morbid obesity/snoring  Plan outpatient sleep study  5. HTN - Held home meds - Continue cardizem gtt  For questions or updates, please contact [facility] Please consult [email or web address] for contact info under        Signed, , PA  01/28/2021, 8:31 AM    History and all data above reviewed.  Patient examined.   I agree with the findings as above.   Breathing OK.  No pain.  He does not notice his HR.  The patient exam reveals 01/30/2021   ,  Lungs: Clear  ,  Abd: Positive bowel sounds, no rebound no guarding, Ext Mild edema.    .  All available labs, radiology testing, previous records reviewed. Agree with documented assessment and plan.   Atrial fib:  I will plan TEE/DCCV tomorrow.  Continue anticoagulation and IV Dilt.  Echo is pending and other therapy will be based on this.  Holding off on diuretic for now as he is oxygenating well and BP is lower.   LPF:[id number] [name]  10:16 AM  01/28/2021

## 2021-01-29 ENCOUNTER — Inpatient Hospital Stay (HOSPITAL_COMMUNITY): Payer: Commercial Managed Care - PPO

## 2021-01-29 ENCOUNTER — Encounter (HOSPITAL_COMMUNITY): Payer: Self-pay | Admitting: Internal Medicine

## 2021-01-29 ENCOUNTER — Inpatient Hospital Stay (HOSPITAL_COMMUNITY): Payer: Commercial Managed Care - PPO | Admitting: Anesthesiology

## 2021-01-29 ENCOUNTER — Other Ambulatory Visit (HOSPITAL_COMMUNITY): Payer: Self-pay

## 2021-01-29 ENCOUNTER — Encounter (HOSPITAL_COMMUNITY)
Admission: EM | Disposition: A | Discharge status: Home / Self Care | Payer: Self-pay | Source: Home / Self Care | Attending: Internal Medicine

## 2021-01-29 DIAGNOSIS — I351 Nonrheumatic aortic (valve) insufficiency: Secondary | ICD-10-CM

## 2021-01-29 DIAGNOSIS — I4819 Other persistent atrial fibrillation: Principal | ICD-10-CM

## 2021-01-29 DIAGNOSIS — I340 Nonrheumatic mitral (valve) insufficiency: Secondary | ICD-10-CM

## 2021-01-29 DIAGNOSIS — I5042 Chronic combined systolic (congestive) and diastolic (congestive) heart failure: Secondary | ICD-10-CM | POA: Insufficient documentation

## 2021-01-29 DIAGNOSIS — I4891 Unspecified atrial fibrillation: Secondary | ICD-10-CM

## 2021-01-29 DIAGNOSIS — I361 Nonrheumatic tricuspid (valve) insufficiency: Secondary | ICD-10-CM

## 2021-01-29 DIAGNOSIS — I420 Dilated cardiomyopathy: Secondary | ICD-10-CM

## 2021-01-29 HISTORY — PX: TEE WITHOUT CARDIOVERSION: SHX5443

## 2021-01-29 HISTORY — PX: CARDIOVERSION: SHX1299

## 2021-01-29 LAB — BASIC METABOLIC PANEL
Anion gap: 9 (ref 5–15)
BUN: 16 mg/dL (ref 6–20)
CO2: 23 mmol/L (ref 22–32)
Calcium: 9.4 mg/dL (ref 8.9–10.3)
Chloride: 106 mmol/L (ref 98–111)
Creatinine, Ser: 0.95 mg/dL (ref 0.61–1.24)
GFR, Estimated: >60 mL/min (ref >60)
Glucose, Bld: 117 mg/dL — ABNORMAL HIGH (ref 70–99)
Potassium: 4.1 mmol/L (ref 3.5–5.1)
Sodium: 138 mmol/L (ref 135–145)

## 2021-01-29 LAB — MAGNESIUM: Magnesium: 1.9 mg/dL (ref 1.7–2.4)

## 2021-01-29 LAB — PROTIME-INR
INR: 1.2 (ref 0.8–1.2)
Prothrombin Time: 14.9 seconds (ref 11.4–15.2)

## 2021-01-29 SURGERY — ECHOCARDIOGRAM, TRANSESOPHAGEAL
Anesthesia: General

## 2021-01-29 MED ORDER — POTASSIUM CHLORIDE CRYS ER 20 MEQ PO TBCR
20.0000 meq | EXTENDED_RELEASE_TABLET | Freq: Once | ORAL | Status: AC
  Administered 2021-01-29: 20 meq via ORAL
  Filled 2021-01-29: qty 1

## 2021-01-29 MED ORDER — METOPROLOL TARTRATE 25 MG PO TABS
25.0000 mg | ORAL_TABLET | Freq: Four times a day (QID) | ORAL | Status: DC
  Administered 2021-01-29 – 2021-02-01 (×12): 25 mg via ORAL
  Filled 2021-01-29 (×12): qty 1

## 2021-01-29 MED ORDER — SODIUM CHLORIDE 0.9 % IV SOLN: 250.0000 mL | INTRAVENOUS | Status: DC | PRN

## 2021-01-29 MED ORDER — SODIUM CHLORIDE 0.9% FLUSH: 3.0000 mL | INTRAVENOUS | Status: DC | PRN

## 2021-01-29 MED ORDER — BUTAMBEN-TETRACAINE-BENZOCAINE 2-2-14 % EX AERO
INHALATION_SPRAY | CUTANEOUS | Status: DC | PRN
  Administered 2021-01-29: 2 via TOPICAL

## 2021-01-29 MED ORDER — SACUBITRIL-VALSARTAN 24-26 MG PO TABS
1.0000 | ORAL_TABLET | Freq: Two times a day (BID) | ORAL | Status: DC
  Administered 2021-01-29 – 2021-01-30 (×4): 1 via ORAL
  Filled 2021-01-29 (×4): qty 1

## 2021-01-29 MED ORDER — PROPOFOL 10 MG/ML IV BOLUS
INTRAVENOUS | Status: DC | PRN
  Administered 2021-01-29: 20 mg via INTRAVENOUS

## 2021-01-29 MED ORDER — SODIUM CHLORIDE 0.9% FLUSH
3.0000 mL | Freq: Two times a day (BID) | INTRAVENOUS | Status: DC
Start: 2021-01-29 — End: 2021-02-01
  Administered 2021-01-29 – 2021-01-31 (×4): 3 mL via INTRAVENOUS

## 2021-01-29 MED ORDER — DOFETILIDE 500 MCG PO CAPS
500.0000 ug | ORAL_CAPSULE | Freq: Two times a day (BID) | ORAL | Status: DC
  Administered 2021-01-29 – 2021-02-01 (×6): 500 ug via ORAL
  Filled 2021-01-29 (×6): qty 1

## 2021-01-29 MED ORDER — PROPOFOL 500 MG/50ML IV EMUL
INTRAVENOUS | Status: DC | PRN
  Administered 2021-01-29: 100 ug/kg/min via INTRAVENOUS

## 2021-01-29 MED ORDER — LIDOCAINE 2% (20 MG/ML) 5 ML SYRINGE
INTRAMUSCULAR | Status: DC | PRN
  Administered 2021-01-29: 60 mg via INTRAVENOUS

## 2021-01-29 MED ORDER — MAGNESIUM SULFATE 2 GM/50ML IV SOLN
2.0000 g | Freq: Once | INTRAVENOUS | Status: AC
  Administered 2021-01-29: 2 g via INTRAVENOUS
  Filled 2021-01-29: qty 50

## 2021-01-29 NOTE — CV Procedure (Signed)
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 Start Tikosyn Long term would benefit from ablation   Charlton Haws MD [facility]

## 2021-01-29 NOTE — TOC Benefit Eligibility Note (Signed)
Patient Product/process development scientist completed.    The patient is currently admitted and upon discharge could be taking Dofetilide 500 mcg.  The current 30 day co-pay is, $87.21.   The patient is insured through [employer]    Roland Earl, CPhT Pharmacy Patient Advocate Specialist [facility] Antimicrobial Stewardship Team Direct Number: [phone]  Fax: [phone]

## 2021-01-29 NOTE — Transfer of Care (Signed)
Immediate Anesthesia Transfer of Care Note  Patient: Joe Morrison  Procedure(s) Performed: TRANSESOPHAGEAL ECHOCARDIOGRAM (TEE) (N/A ) CARDIOVERSION (N/A )  Patient Location: Endoscopy Unit  Anesthesia Type:General  Level of Consciousness: awake, alert  and oriented  Airway & Oxygen Therapy: Patient Spontanous Breathing and Patient connected to face mask oxygen  Post-op Assessment: Report given to RN, Post -op Vital signs reviewed and stable and Patient moving all extremities  Post vital signs: Reviewed and stable  Last Vitals:  Vitals Value Taken Time  BP    Temp    Pulse 101   Resp 17 01/29/21 0911  SpO2 96   Vitals shown include unvalidated device data.  Last Pain:  Vitals:   01/29/21 0741  TempSrc: Oral  PainSc: 0-No pain         Complications: No complications documented.

## 2021-01-29 NOTE — Consult Note (Addendum)
ELECTROPHYSIOLOGY CONSULT NOTE    Patient ID: Joe Morrison MRN: [id number], DOB/AGE: [birth year] 55 y.o.  Admit date: 01/27/2021 Date of Consult: 01/29/2021  Primary Physician: Agapito Games, MD Primary Cardiologist: Meriam Sprague, MD  Electrophysiologist: New  Referring Provider: Dr. Ladona Ridgel  Patient Profile: Joe Morrison is a 55 y.o. male with a history of remote ETOH abuse, HTN and recently diagnosed AF who is being seen today for the evaluation of AF with RVR at the request of Dr. Antoine Poche.  HPI:  Joe Morrison is a 55 y.o. male with medical history as above.  Pt presented to [facility] 4/18 with worsening SOB with exertion. He reports some DOE at baseline he had attributed to morbid obesity. Over the past 2 weeks he has had intermittent orthopnea as well. On arrival found to be in AF with HR of 170 bpm. Placed on BiPAP and then transitioned to [place]. Rates have improved with cardizem gtt -> now to oral lopressor  Echo 4/19 with new diagnosis of EF 35-40%, Severe LAE, Mod RAE, normal RV.  TEE today with EF 30-35%, Severe LAE, mild MR, Mod TR, Severe AR.   Pt underwent TEE/DCC this am and failed to cardiovert despite shock x 4. No apparent NSR. EP team asked to see for consideration of inpatient tikosyn loading.   Patient currently feeling Ok at rest. Still with mild orthopnea. Chest skin sore from multiple shocks. Repeats history above. Has been sober of drugs and alcohol since 55 yo. No h/o sleep study.   Past Medical History:  Diagnosis Date  . Alcoholic (HCC)    recovering since 10/1999  . Hypertension      Surgical History:  Past Surgical History:  Procedure Laterality Date  . MOUTH SURGERY     Dental work  . TONSILLECTOMY  1972     Medications Prior to Admission  Medication Sig Dispense Refill Last Dose  . metoprolol succinate (TOPROL-XL) 50 MG 24 hr tablet TAKE 1 TABLET(50 MG) BY MOUTH DAILY (Patient taking differently: Take 50 mg by  mouth daily.) 90 tablet 1 01/27/2021 at 0715  . Multiple Vitamin (MULTIVITAMIN WITH MINERALS) TABS tablet Take 1 tablet by mouth daily.   01/27/2021 at Unknown time  . Multiple Vitamins-Minerals (AIRBORNE PO) Take 1 packet by mouth daily.   01/27/2021 at Unknown time  . Olmesartan-amLODIPine-HCTZ 40-5-12.5 MG TABS TAKE 1 TABLET BY MOUTH DAILY 90 tablet 0 01/27/2021 at Unknown time    Inpatient Medications:  . apixaban  5 mg Oral BID    Allergies: No Known Allergies  Social History   Socioeconomic History  . Marital status: Single    Spouse name: Not on file  . Number of children: 0  . Years of education: Not on file  . Highest education level: Not on file  Occupational History  . Not on file  Tobacco Use  . Smoking status: Former Smoker    Quit date: 04/27/1999    Years since quitting: 21.7  . Smokeless tobacco: Never Used  Substance and Sexual Activity  . Alcohol use: No    Comment: Former Advice worker in [employer]  . Drug use: No  . Sexual activity: Not Currently    Partners: Male  Other Topics Concern  . Not on file  Social History Narrative   Working out daily with a Psychologist, educational. No partner   Social Determinants of Corporate investment banker Strain: Not on file  Food Insecurity: Not on file  Transportation Needs: Not  on file  Physical Activity: Not on file  Stress: Not on file  Social Connections: Not on file  Intimate Partner Violence: Not on file     Family History  Problem Relation Age of Onset  . Diabetes Mother   . Hypertension Mother   . Heart attack Father [id number]  . Alcohol abuse Other      Review of Systems: All other systems reviewed and are otherwise negative except as noted above.  Physical Exam: Vitals:   01/29/21 0912 01/29/21 0925 01/29/21 0935 01/29/21 0957  BP: 108/66 111/74 112/71 (!) 147/77  Pulse: 95 91 95   Resp: (!) 21 11 17    Temp: 97.7 F (36.5 C)   97.6 F (36.4 C)  TempSrc: Oral   Oral  SpO2: 98% 94% 96%   Weight:      Height:         GEN- The patient is well appearing, alert and oriented x 3 today.   HEENT: normocephalic, atraumatic; sclera clear, conjunctiva pink; hearing intact; oropharynx clear; neck supple Lungs- Clear to ausculation bilaterally, normal work of breathing.  No wheezes, rales, rhonchi Heart- Regular rate and rhythm, no murmurs, rubs or gallops GI- soft, non-tender, non-distended, bowel sounds present Extremities- no clubbing, cyanosis, or edema; DP/PT/radial pulses 2+ bilaterally MS- no significant deformity or atrophy Skin- warm and dry, no rash or lesion Psych- euthymic mood, full affect Neuro- strength and sensation are intact  Labs:   Lab Results  Component Value Date   WBC 7.5 01/28/2021   HGB 13.8 01/28/2021   HCT 41.4 01/28/2021   MCV 93.5 01/28/2021   PLT 199 01/28/2021    Recent Labs  Lab 01/27/21 1355 01/28/21 0039  NA 136 141  K 3.8 3.9  CL 103 109  CO2 21* 24  BUN 15 14  CREATININE 1.06 1.23  CALCIUM 10.0 9.8  PROT 7.7  --   BILITOT 0.5  --   ALKPHOS 48  --   ALT 34  --   AST 25  --   GLUCOSE 188* 116*      Radiology/Studies: DG Chest Portable 1 View  Result Date: 01/27/2021 CLINICAL DATA:  Short of breath EXAM: PORTABLE CHEST 1 VIEW COMPARISON:  None. FINDINGS: Cardiac enlargement with vascular congestion. Probable mild interstitial edema. No significant effusion. Mild bibasilar atelectasis. IMPRESSION: Cardiac enlargement with mild vascular congestion and interstitial edema. Electronically Signed   By: Marlan Palauharles  Clark M.D.   On: 01/27/2021 14:50   ECHOCARDIOGRAM COMPLETE  Result Date: 01/28/2021    ECHOCARDIOGRAM REPORT   Patient Name:   Joe BeamRICHARD L Gloeckner Date of Exam: 01/28/2021 Medical Rec #:  [id number]        Height:       69.0 in Accession #:    [id number]       Weight:       335.0 lb Date of Birth:  02-06-1966        BSA:          2.572 m Patient Age:    55 years         BP:           98/62 mmHg Patient Gender: M                HR:           104 bpm. Exam  Location:  Inpatient Procedure: 2D Echo, Cardiac Doppler, Color Doppler and Intracardiac            Opacification  Agent Indications:    Atrrial fibrillation  History:        Patient has no prior history of Echocardiogram examinations.                 Risk Factors:Hypertension.  Sonographer:    Neomia Dear RDCS Referring Phys: [id number] [facility]  Sonographer Comments: Technically difficult study due to poor echo windows, suboptimal apical window and patient is morbidly obese. No cardiac surgery noted on chart IMPRESSIONS  1. Left ventricular ejection fraction, by estimation, is 35-40%. The left ventricle has moderately decreased function. Left ventricular endocardial border not optimally defined to evaluate regional wall motion, even with Definity contrast. There is mild  concentric left ventricular hypertrophy. Left ventricular diastolic function could not be evaluated.  2. Right ventricular systolic function is normal. The right ventricular size is normal. There is mildly elevated pulmonary artery systolic pressure.  3. Left atrial size was severely dilated.  4. Right atrial size was moderately dilated.  5. The mitral valve is normal in structure. No evidence of mitral valve regurgitation. No evidence of mitral stenosis.  6. The aortic valve is normal in structure. Aortic valve regurgitation is not visualized. No aortic stenosis is present.  7. There is mild dilatation of the aortic root, measuring 46 mm. There is mild dilatation of the ascending aorta, measuring 41 mm.  8. The inferior vena cava is dilated in size with <50% respiratory variability, suggesting right atrial pressure of 15 mmHg. FINDINGS  Left Ventricle: Left ventricular ejection fraction, by estimation, is 35 to 40%. The left ventricle has moderately decreased function. Left ventricular endocardial border not optimally defined to evaluate regional wall motion. Definity contrast agent was given IV to delineate the left ventricular  endocardial borders. The left ventricular internal cavity size was normal in size. There is mild concentric left ventricular hypertrophy. Left ventricular diastolic function could not be evaluated due to atrial fibrillation. Left ventricular diastolic function could not be evaluated. Right Ventricle: The right ventricular size is normal. No increase in right ventricular wall thickness. Right ventricular systolic function is normal. There is mildly elevated pulmonary artery systolic pressure. The tricuspid regurgitant velocity is 2.85  m/s, and with an assumed right atrial pressure of 3 mmHg, the estimated right ventricular systolic pressure is 35.5 mmHg. Left Atrium: Left atrial size was severely dilated. Right Atrium: Right atrial size was moderately dilated. Pericardium: There is no evidence of pericardial effusion. Mitral Valve: The mitral valve is normal in structure. No evidence of mitral valve regurgitation. No evidence of mitral valve stenosis. Tricuspid Valve: The tricuspid valve is normal in structure. Tricuspid valve regurgitation is trivial. No evidence of tricuspid stenosis. Aortic Valve: The aortic valve is normal in structure. Aortic valve regurgitation is not visualized. No aortic stenosis is present. Aortic valve mean gradient measures 6.0 mmHg. Aortic valve peak gradient measures 11.0 mmHg. Aortic valve area, by VTI measures 4.95 cm. Pulmonic Valve: The pulmonic valve was not well visualized. Pulmonic valve regurgitation is not visualized. No evidence of pulmonic stenosis. Aorta: The aortic root is normal in size and structure. There is mild dilatation of the aortic root, measuring 46 mm. There is mild dilatation of the ascending aorta, measuring 41 mm. Venous: The inferior vena cava is dilated in size with less than 50% respiratory variability, suggesting right atrial pressure of 15 mmHg. IAS/Shunts: No atrial level shunt detected by color flow Doppler.  LEFT VENTRICLE PLAX 2D LVIDd:         5.30  cm  LVIDs:         4.80 cm LV PW:         1.40 cm LV IVS:        1.40 cm LVOT diam:     3.20 cm LV SV:         138 LV SV Index:   53 LVOT Area:     8.04 cm  RIGHT VENTRICLE TAPSE (M-mode): 1.3 cm LEFT ATRIUM              Index       RIGHT ATRIUM           Index LA diam:        5.10 cm  1.98 cm/m  RA Area:     25.80 cm LA Vol (A2C):   184.0 ml 71.53 ml/m RA Volume:   80.60 ml  31.33 ml/m LA Vol (A4C):   124.0 ml 48.20 ml/m LA Biplane Vol: 154.0 ml 59.87 ml/m  AORTIC VALVE                    PULMONIC VALVE AV Area (Vmax):    4.78 cm     PV Vmax:       0.56 m/s AV Area (Vmean):   4.98 cm     PV Vmean:      42.300 cm/s AV Area (VTI):     4.95 cm     PV VTI:        0.113 m AV Vmax:           166.00 cm/s  PV Peak grad:  1.3 mmHg AV Vmean:          119.000 cm/s PV Mean grad:  1.0 mmHg AV VTI:            0.278 m AV Peak Grad:      11.0 mmHg AV Mean Grad:      6.0 mmHg LVOT Vmax:         98.70 cm/s LVOT Vmean:        73.700 cm/s LVOT VTI:          0.171 m LVOT/AV VTI ratio: 0.62  AORTA Ao Root diam: 4.60 cm Ao Asc diam:  4.10 cm MITRAL VALVE               TRICUSPID VALVE MV Area (PHT): 6.51 cm    TR Peak grad:   32.5 mmHg MV Decel Time: 117 msec    TR Vmax:        285.00 cm/s MV E velocity: 96.40 cm/s                            SHUNTS                            Systemic VTI:  0.17 m                            Systemic Diam: 3.20 cm Mihai Croitoru MD Electronically signed by Thurmon Fair MD Signature Date/Time: 01/28/2021/11:30:31 AM    Final     EKG: today shows rate controlled AF at 85 bpm (personally reviewed)  EKG on arrival showed AF with RVR at 170 bpm.  No NSR EKGs to compare.   TELEMETRY: Atrial fibrillation with controlled ventricular rates currently  (personally reviewed)  Assessment/Plan: 1.  Persistent atrial  fibrillation with RVR No candidate for flecainide or multaq with CHF  Ideally would avoid amiodarone in this young patient.  QTc and CrCl are in range for tikosyn at highest dose.  Would require observation at least through Saturday.  Would need Case Management assistance to ensure local stock of Tikosyn for weekend discharge (TOC closed) He is not on QTc prolonging agents. Pt is anxious about getting home and not sure he want's a prolonged stay on tikosyn. Encouraged him that the sooner we can get him into normal rhythm the greater his long term success will be. He wishes to think about it.   2. Acute combined systolic/diastolic CHF ? ETOH induced vs tachy-mediated vs underlying ischemia No s/s ischemia; No ischemic work up yet. Agree with plan to stop dilti and change to lopressor with CHF. Consolidate to Toprol on discharge.  Entresto started as well Consider spironolactone as tolerated Consider digoxin which could additionally benefit his AF rates.  Consider SGLTi AVOID resuming HCTz. This can increase serum levels of tikosyn. Update BMET. Mg 1.9. Supp given.    3. Severe Aortic insuffiency Unclear chronicity or if this is a surgical issue.   4. Suspected sleep apnea Body mass index is 49.47 kg/m.  Will need outpatient sleep study and weight loss for any high chance of maintaining NSR.  5. H/o ETOH abuse He has been sober since age 89. Congratulated.   For questions or updates, please contact [facility] Please consult [email or web address] for contact info under Cardiology/STEMI.  Dustin Flock, PA-C  01/29/2021 11:17 AM  EP Attending  Patient seen and examined. Asked to see by Dr. [facility] for evaluation of persistent atrial fib. He is a pleasant 55 yo man with a recent h/o ETOH use and obesity who has had worsening dyspnea and fatigue for the past who presents with uncontrolled atrial fib with an RVR and has been found to have AI, LAA of 51 and could not be cardioverted earlier today. The patient has had weight trouble all of his life but does not feel his palpitations. He has been treated with rate control. He has not been on AA drug therapy.  His exam is as noted above. He is a morbidly obese man, NAD. IRIR with minimal scattered rales on lung exam and no edema. Tele with atrial fib and a RVR. I discussed the treatment options with the patient and he is willing to stay in the hospital for initiation of dofetilide. He will concomittantly need to work on his weight and undergo eval for sleep apnea. Also, the patient has mod/severe AI and may eventually need surgical evaluation but I defer to Dr. Davonna Belling et al.  Dorathy Daft

## 2021-01-29 NOTE — TOC Benefit Eligibility Note (Signed)
Transition of Care [facility]) Benefit Eligibility Note    Patient Details  Name: Joe Morrison MRN: [id number] Date of Birth: December 21, 1965   Medication/Dose: ELIQUIS  5 MG BID OR ELIQUIS 2.5 MG BID -CO-PAY- $509.20 APIXABAN : NON-FORMULARY  and   ENTRESTO 24-26 MG BID  COVER- YES  CO-PAY- $627.00 P/A-NO  Covered?: Yes  Tier:  (PREFERRED)  Prescription Coverage Preferred [facility]: [facility]  [facility]  Spoke with Person/Company/Phone Number:: COCO  @  [employer] RX # [phone]  Co-Pay: $ 509.20  Prior Approval: No  Deductible: Unmet (OUT-OF-POCKET : UNMET)  Additional Notes: DOFETILIDE  500 MCG BID COVER- YES CO-PAY- $ 87.21 P/A -NO and   TIKOSYN 500 MCG BID : NOT COVER  P/A-YES # [phone]    Mardene Sayer Phone Number: 01/29/2021, 3:09 PM

## 2021-01-29 NOTE — Progress Notes (Signed)
  Echocardiogram Echocardiogram Transesophageal has been performed.  Augustine Radar 01/29/2021, 11:24 AM

## 2021-01-29 NOTE — Progress Notes (Addendum)
Progress Note  Patient Name: Joe Morrison Date of Encounter: 01/29/2021  [facility] Cardiologist: Meriam Sprague, MD   Subjective   Patient states he is frustrated being in the hospital. He reports improved SOB, denied any chest pain, heart palpitation, dizziness. He wants to go home. He has stopped drinking ETOH for >21 yrs but was heavy drinker for 15 years. He denied tobacco use.   Inpatient Medications    Scheduled Meds: . apixaban  5 mg Oral BID  . metoprolol tartrate  25 mg Oral Q6H  . sacubitril-valsartan  1 tablet Oral BID   Continuous Infusions:  PRN Meds: acetaminophen, nitroGLYCERIN, ondansetron (ZOFRAN) IV   Vital Signs    Vitals:   01/29/21 0912 01/29/21 0925 01/29/21 0935 01/29/21 0957  BP: 108/66 111/74 112/71 (!) 147/77  Pulse: 95 91 95   Resp: (!) Temp: 97.7 F (36.5 C)   97.6 F (36.4 C)  TempSrc: Oral   Oral  SpO2: 98% 94% 96%   Weight:      Height:        Intake/Output Summary (Last 24 hours) at 01/29/2021 1219 Last data filed at 01/29/2021 0300 Gross per 24 hour  Intake 346.63 ml  Output --  Net 346.63 ml   Last 3 Weights 01/27/2021 06/24/2020 12/19/2018  Weight (lbs) 335 lb 320 lb 359 lb  Weight (kg) 151.955 kg 145.151 kg 162.841 kg      Telemetry    A Fib with HR 120s overnight and HR 80s this morning  - Personally Reviewed  ECG    A fib with ventricular rate of 83bpm - Personally Reviewed  Physical Exam   GEN: No acute distress. Ambulating independently. Morbidly obese.  Neck: No JVD, short and thick neck  Cardiac: Irregularly irregular, no murmurs, rubs, or gallops.  Respiratory: Clear to auscultation bilaterally. On room air. DOE noted with ambulation  GI: Soft, nontender, non-distended  MS: Trace ankle edema; No deformity. Neuro:  Nonfocal  Psych: Normal affect   Labs    High Sensitivity Troponin:   Recent Labs  Lab 01/27/21 1355 01/27/21 1614  TROPONINIHS 47* 47*      Chemistry Recent  Labs  Lab 01/27/21 1355 01/28/21 0039  NA 136 141  K 3.8 3.9  CL 103 109  CO2 21* 24  GLUCOSE 188* 116*  BUN 15 14  CREATININE 1.06 1.23  CALCIUM 10.0 9.8  PROT 7.7  --   ALBUMIN 4.2  --   AST 25  --   ALT 34  --   ALKPHOS 48  --   BILITOT 0.5  --   GFRNONAA >60 >60  ANIONGAP 12 8     Hematology Recent Labs  Lab 01/27/21 1355 01/28/21 0039  WBC 9.4 7.5  RBC 4.92 4.43  HGB 15.2 13.8  HCT 44.8 41.4  MCV 91.1 93.5  MCH 30.9 31.2  MCHC 33.9 33.3  RDW 15.1 15.3  PLT 250 199    BNP Recent Labs  Lab 01/27/21 1355  BNP 475.5*     DDimer No results for input(s): DDIMER in the last 168 hours.   Radiology    DG Chest Portable 1 View  Result Date: 01/27/2021 CLINICAL DATA:  Short of breath EXAM: PORTABLE CHEST 1 VIEW COMPARISON:  None. FINDINGS: Cardiac enlargement with vascular congestion. Probable mild interstitial edema. No significant effusion. Mild bibasilar atelectasis. IMPRESSION: Cardiac enlargement with mild vascular congestion and interstitial edema. Electronically Signed   By: Leonette Most  Chestine Spore M.D.   On: 01/27/2021 14:50   ECHOCARDIOGRAM COMPLETE  Result Date: 01/28/2021    ECHOCARDIOGRAM REPORT   Patient Name:   Joe Morrison Date of Exam: 01/28/2021 Medical Rec #:  [id number]        Height:       69.0 in Accession #:    [id number]       Weight:       335.0 lb Date of Birth:  02-28-1966        BSA:          2.572 m Patient Age:    54 years         BP:           98/62 mmHg Patient Gender: M                HR:           104 bpm. Exam Location:  Inpatient Procedure: 2D Echo, Cardiac Doppler, Color Doppler and Intracardiac            Opacification Agent Indications:    Atrrial fibrillation  History:        Patient has no prior history of Echocardiogram examinations.                 Risk Factors:Hypertension.  Sonographer:    Neomia Dear RDCS Referring Phys: [id number] [facility]  Sonographer Comments: Technically difficult study due to poor echo windows,  suboptimal apical window and patient is morbidly obese. No cardiac surgery noted on chart IMPRESSIONS  1. Left ventricular ejection fraction, by estimation, is 35-40%. The left ventricle has moderately decreased function. Left ventricular endocardial border not optimally defined to evaluate regional wall motion, even with Definity contrast. There is mild  concentric left ventricular hypertrophy. Left ventricular diastolic function could not be evaluated.  2. Right ventricular systolic function is normal. The right ventricular size is normal. There is mildly elevated pulmonary artery systolic pressure.  3. Left atrial size was severely dilated.  4. Right atrial size was moderately dilated.  5. The mitral valve is normal in structure. No evidence of mitral valve regurgitation. No evidence of mitral stenosis.  6. The aortic valve is normal in structure. Aortic valve regurgitation is not visualized. No aortic stenosis is present.  7. There is mild dilatation of the aortic root, measuring 46 mm. There is mild dilatation of the ascending aorta, measuring 41 mm.  8. The inferior vena cava is dilated in size with <50% respiratory variability, suggesting right atrial pressure of 15 mmHg. FINDINGS  Left Ventricle: Left ventricular ejection fraction, by estimation, is 35 to 40%. The left ventricle has moderately decreased function. Left ventricular endocardial border not optimally defined to evaluate regional wall motion. Definity contrast agent was given IV to delineate the left ventricular endocardial borders. The left ventricular internal cavity size was normal in size. There is mild concentric left ventricular hypertrophy. Left ventricular diastolic function could not be evaluated due to atrial fibrillation. Left ventricular diastolic function could not be evaluated. Right Ventricle: The right ventricular size is normal. No increase in right ventricular wall thickness. Right ventricular systolic function is normal. There  is mildly elevated pulmonary artery systolic pressure. The tricuspid regurgitant velocity is 2.85  m/s, and with an assumed right atrial pressure of 3 mmHg, the estimated right ventricular systolic pressure is 35.5 mmHg. Left Atrium: Left atrial size was severely dilated. Right Atrium: Right atrial size was moderately dilated. Pericardium: There is no evidence of pericardial  effusion. Mitral Valve: The mitral valve is normal in structure. No evidence of mitral valve regurgitation. No evidence of mitral valve stenosis. Tricuspid Valve: The tricuspid valve is normal in structure. Tricuspid valve regurgitation is trivial. No evidence of tricuspid stenosis. Aortic Valve: The aortic valve is normal in structure. Aortic valve regurgitation is not visualized. No aortic stenosis is present. Aortic valve mean gradient measures 6.0 mmHg. Aortic valve peak gradient measures 11.0 mmHg. Aortic valve area, by VTI measures 4.95 cm. Pulmonic Valve: The pulmonic valve was not well visualized. Pulmonic valve regurgitation is not visualized. No evidence of pulmonic stenosis. Aorta: The aortic root is normal in size and structure. There is mild dilatation of the aortic root, measuring 46 mm. There is mild dilatation of the ascending aorta, measuring 41 mm. Venous: The inferior vena cava is dilated in size with less than 50% respiratory variability, suggesting right atrial pressure of 15 mmHg. IAS/Shunts: No atrial level shunt detected by color flow Doppler.  LEFT VENTRICLE PLAX 2D LVIDd:         5.30 cm LVIDs:         4.80 cm LV PW:         1.40 cm LV IVS:        1.40 cm LVOT diam:     3.20 cm LV SV:         138 LV SV Index:   53 LVOT Area:     8.04 cm  RIGHT VENTRICLE TAPSE (M-mode): 1.3 cm LEFT ATRIUM              Index       RIGHT ATRIUM           Index LA diam:        5.10 cm  1.98 cm/m  RA Area:     25.80 cm LA Vol (A2C):   184.0 ml 71.53 ml/m RA Volume:   80.60 ml  31.33 ml/m LA Vol (A4C):   124.0 ml 48.20 ml/m LA Biplane  Vol: 154.0 ml 59.87 ml/m  AORTIC VALVE                    PULMONIC VALVE AV Area (Vmax):    4.78 cm     PV Vmax:       0.56 m/s AV Area (Vmean):   4.98 cm     PV Vmean:      42.300 cm/s AV Area (VTI):     4.95 cm     PV VTI:        0.113 m AV Vmax:           166.00 cm/s  PV Peak grad:  1.3 mmHg AV Vmean:          119.000 cm/s PV Mean grad:  1.0 mmHg AV VTI:            0.278 m AV Peak Grad:      11.0 mmHg AV Mean Grad:      6.0 mmHg LVOT Vmax:         98.70 cm/s LVOT Vmean:        73.700 cm/s LVOT VTI:          0.171 m LVOT/AV VTI ratio: 0.62  AORTA Ao Root diam: 4.60 cm Ao Asc diam:  4.10 cm MITRAL VALVE               TRICUSPID VALVE MV Area (PHT): 6.51 cm    TR Peak grad:   32.5  mmHg MV Decel Time: 117 msec    TR Vmax:        285.00 cm/s MV E velocity: 96.40 cm/s                            SHUNTS                            Systemic VTI:  0.17 m                            Systemic Diam: 3.20 cm Thurmon Fair MD Electronically signed by Thurmon Fair MD Signature Date/Time: 01/28/2021/11:30:31 AM    Final    ECHO TEE  Result Date: 01/29/2021    TRANSESOPHOGEAL ECHO REPORT   Patient Name:   Joe Morrison Date of Exam: 01/29/2021 Medical Rec #:  [id number]        Height: Accession #:    [id number]       Weight: Date of Birth:  1966-05-28        BSA: Patient Age:    54 years         BP:           147/77 mmHg Patient Gender: M                HR:           105 bpm. Exam Location:  Inpatient Procedure: Transesophageal Echo, 3D Echo and Color Doppler Indications:     I48.91* Unspeicified atrial fibrillation  History:         Patient has prior history of Echocardiogram examinations, most                  recent 01/28/2021. CHF, Arrythmias:Atrial Fibrillation; Risk                  Factors:Hypertension and ETOH.  Sonographer:     Eulah Pont RDCS Referring Phys:  [id number] Manson Passey Diagnosing Phys: Charlton Haws MD PROCEDURE: After discussion of the risks and benefits of a TEE, an informed consent was  obtained from the patient. The transesophogeal probe was passed without difficulty through the esophogus of the patient. Local oropharyngeal anesthetic was provided with Cetacaine. Sedation performed by different physician. The patient was monitored while under deep sedation. Anesthestetic sedation was provided intravenously by Anesthesiology: 117.6mg  of Propofol,  of Lidocaine. The patient's vital signs; including heart rate, blood pressure, and oxygen saturation; remained stable throughout the procedure. The patient developed no complications during the procedure. An unsuccessful direct current cardioversion was performed at 200 joules with 4. IMPRESSIONS  1. DCC attempted x 4 with 200J biphasic and AP manual compression Patient failed to convert to sinus.  2. Left ventricular ejection fraction, by estimation, is 30 to 35%. The left ventricle has moderately decreased function. The left ventricle demonstrates global hypokinesis. The left ventricular internal cavity size was severely dilated.  3. Right ventricular systolic function is normal. The right ventricular size is normal.  4. Extensive 3D imaging of LAA performed. Left atrial size was severely dilated. No left atrial/left atrial appendage thrombus was detected.  5. Right atrial size was mildly dilated.  6. 3 D imaging MV performed . The mitral valve is normal in structure. Mild mitral valve regurgitation.  7. Tricuspid valve regurgitation is moderate.  8. The aortic valve is tricuspid. Aortic valve regurgitation is severe.  FINDINGS  Left Ventricle: Left ventricular ejection fraction, by estimation, is 30 to 35%. The left ventricle has moderately decreased function. The left ventricle demonstrates global hypokinesis. The left ventricular internal cavity size was severely dilated. There is no left ventricular hypertrophy. Right Ventricle: The right ventricular size is normal. Right vetricular wall thickness was not assessed. Right ventricular systolic  function is normal. Left Atrium: Extensive 3D imaging of LAA performed. Left atrial size was severely dilated. No left atrial/left atrial appendage thrombus was detected. Right Atrium: Right atrial size was mildly dilated. Pericardium: There is no evidence of pericardial effusion. Mitral Valve: 3 D imaging MV performed. The mitral valve is normal in structure. Mild mitral valve regurgitation. Tricuspid Valve: The tricuspid valve is normal in structure. Tricuspid valve regurgitation is moderate. Aortic Valve: The aortic valve is tricuspid. Aortic valve regurgitation is severe. Pulmonic Valve: The pulmonic valve was normal in structure. Pulmonic valve regurgitation is mild. Aorta: The aortic root is normal in size and structure. IAS/Shunts: No atrial level shunt detected by color flow Doppler. Additional Comments: DCC attempted x 4 with 200J biphasic and AP manual compression Patient failed to convert to sinus. Charlton Haws MD Electronically signed by Charlton Haws MD Signature Date/Time: 01/29/2021/11:32:17 AM    Final     Cardiac Studies   Echo from 01/28/21:  1. Left ventricular ejection fraction, by estimation, is 35-40%. The left  ventricle has moderately decreased function. Left ventricular endocardial  border not optimally defined to evaluate regional wall motion, even with  Definity contrast. There is mild  concentric left ventricular hypertrophy. Left ventricular diastolic  function could not be evaluated.  2. Right ventricular systolic function is normal. The right ventricular  size is normal. There is mildly elevated pulmonary artery systolic  pressure.  3. Left atrial size was severely dilated.  4. Right atrial size was moderately dilated.  5. The mitral valve is normal in structure. No evidence of mitral valve  regurgitation. No evidence of mitral stenosis.  6. The aortic valve is normal in structure. Aortic valve regurgitation is  not visualized. No aortic stenosis is present.   7. There is mild dilatation of the aortic root, measuring 46 mm. There is  mild dilatation of the ascending aorta, measuring 41 mm.  8. The inferior vena cava is dilated in size with <50% respiratory  variability, suggesting right atrial pressure of 15 mmHg.   TEE 01/29/2021:  1. DCC attempted x 4 with 200J biphasic and AP manual compression Patient failed to convert to sinus. 2. Left ventricular ejection fraction, by estimation, is 30 to 35%. The left ventricle has moderately decreased function. The left ventricle demonstrates global hypokinesis. The left ventricular internal cavity size was severely dilated. 3. Right ventricular systolic function is normal. The right ventricular size is normal. 4. Extensive 3D imaging of LAA performed. Left atrial size was severely dilated. No left atrial/left atrial appendage thrombus was detected. 5. Right atrial size was mildly dilated. 6. 3 D imaging MV performed . The mitral valve is normal in structure. Mild mitral valve regurgitation. 7. Tricuspid valve regurgitation is moderate. 8. The aortic valve is tricuspid. Aortic valve regurgitation is severe.   Patient Profile     55 y.o. male with PMH of HTN, morbid obesity, prior EOTH abuse, who is [facility] from [facility] to [facility] for further management of newly diagnosed with A fib with RVR.     Assessment & Plan    Newly diagnosed A fib with RVR: presented to [facility] for  worsening of progressive SOB x2 weeks, intermittent orthopnea, lower extremity edema, and fatigue. He required BIPAP support at ED and started on Cardizem gtt [facility]. BNP 475. Hs Trop flat 47 >47. CXR with cardiac enlargement with mild vascular congestion and interstitial edema. EKG showed A Fib RVR 170s. Echo showed EF 35-40%, unable assess RWMA and diastolic function, RV normal, LA severely dilated,  RA moderately dilated,  mild dilatation of the aortic root, measuring 46 mm, mild dilatation of  the ascending aorta, measuring 41 mm. - rate controlled is achieved by Cardizem gtt, will transition PO metoprolol 25mg  Q6H today due to reduced EF - s/p failed coversion from TEE with DCCV x4 at 200J today, TEE today with EF 30%, LV global hypokinesis,  mild MR, severe AR, no LAA thrombus, moderate TR - will consult EP today for evaluation of antiarrythmic - TSH WNL;  replete electrolyte to keep K >4 and Mag >2  - CHA2DS2VASc score is 2 due to HTN, CHF; recommend anticoagulation, started on Eliquis 5mg  BID this admission   Newly diagnosed combined heart failure Pulmonary edema POA - c/o 2 weeks onset of orthopnea, leg edema, fatigue  - Echo 4/18 showed EF 35-40%, unable assess RWMA and diastolic function, RV normal, LA severely dilated,  RA moderately dilated, mild dilatation of the aortic root, measuring 46 mm, mild dilatation of the ascending aorta, measuring 41 mm. 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, ETOH induced cardiomyopathy, +/- underlying ischemic process +/- undiagnosed OSA  - s/p IV Lasix 40mg  at admission, diuretic has been held due to soft BP yesterday  - Net +346 ml over the past 24 hours, daily weight not documented, please monitor I&O and daily weight  - education provided on CHF signs and diet restriction  - will start Entresto 24/26 BID for GDMT, monitor renal function, likely outpatient add/titration of aldactone and SGLT2i  Severe AR, mild MR and TR  - not seen Echo but noted on TEE today , Echo did reveal mild dilatation of the aortic root, measuring 46 mm, mild dilatation of the ascending aorta, measuring 41 mm. - follow up outpatient with repeat Echo   Elevated troponin - Trop flat 47-47, suspect demand ischemia , no chest pain   HTN - appears taking metoprolol, Olmesartan, amlodipine, HCTZ  at home  - BP is elevated this morning 147/77, was low normal yesterday, labile  - will transition diltiazem to metoprolol today for rate and HTN control   Morbid obesity  - discussed lifestyle modification  Snoring  - outpatient sleep study recommend for OSA evaluation      For questions or updates, please contact [facility] Please consult [email or web address] for contact info under        Signed, Cyndi BenderXika Zhao, NP  01/29/2021, 12:19 PM    History and all data above reviewed.  Patient examined.  I agree with the findings as above.  Long discussion with the patient.  He needs to be in sinus if possible to assist with remodeling and HF management.   The patient exam reveals ZOX:[id number]:Irregular   ,  Lungs: clear  ,  Abd: Positive bowel sounds, no rebound no guarding, Ext   Mild leg edema  .  All available labs, radiology testing, previous records reviewed. Agree with documented assessment and  plan.   Atrial fib:  EP consulted. Will start Tikosyn and likely repeat DCCV on Friday.  HF:  Increase beta blocker and start Entresto.  Educated.  AI:  Not noted on TTE because of poor windows.  Plan to follow as an out patient after OMT and hopefully NSR.    Fayrene Fearing Sade Mehlhoff  12:22 PM  01/29/2021

## 2021-01-29 NOTE — Anesthesia Preprocedure Evaluation (Addendum)
Anesthesia Evaluation  Patient identified by MRN, date of birth, ID band Patient awake    Reviewed: Allergy & Precautions, H&P , NPO status , Patient's Chart, lab work & pertinent test results, reviewed documented beta blocker date and time   Airway Mallampati: III  TM Distance: >3 FB Neck ROM: Full    Dental no notable dental hx. (+) Edentulous Upper, Dental Advisory Given   Pulmonary neg pulmonary ROS, former smoker,    Pulmonary exam normal breath sounds clear to auscultation       Cardiovascular hypertension, Pt. on medications and Pt. on home beta blockers + dysrhythmias Atrial Fibrillation  Rhythm:Irregular Rate:Tachycardia     Neuro/Psych Depression negative neurological ROS     GI/Hepatic negative GI ROS, Neg liver ROS,   Endo/Other  Morbid obesity  Renal/GU negative Renal ROS  negative genitourinary   Musculoskeletal   Abdominal   Peds  Hematology negative hematology ROS (+)   Anesthesia Other Findings   Reproductive/Obstetrics negative OB ROS                            Anesthesia Physical Anesthesia Plan  ASA: III  Anesthesia Plan: General   Post-op Pain Management:    Induction: Intravenous  PONV Risk Score and Plan: 2 and Propofol infusion and Treatment may vary due to age or medical condition  Airway Management Planned: Nasal Cannula  Additional Equipment:   Intra-op Plan:   Post-operative Plan:   Informed Consent: I have reviewed the patients History and Physical, chart, labs and discussed the procedure including the risks, benefits and alternatives for the proposed anesthesia with the patient or authorized representative who has indicated his/her understanding and acceptance.     Dental advisory given  Plan Discussed with: CRNA  Anesthesia Plan Comments:         Anesthesia Quick Evaluation

## 2021-01-29 NOTE — Interval H&P Note (Signed)
History and Physical Interval Note:  01/29/2021 7:33 AM  Joe Morrison  has presented today for surgery, with the diagnosis of AFIB.  The various methods of treatment have been discussed with the patient and family. After consideration of risks, benefits and other options for treatment, the patient has consented to  Procedure(s): TRANSESOPHAGEAL ECHOCARDIOGRAM (TEE) (N/A) CARDIOVERSION (N/A) as a surgical intervention.  The patient's history has been reviewed, patient examined, no change in status, stable for surgery.  I have reviewed the patient's chart and labs.  Questions were answered to the patient's satisfaction.     Charlton Haws

## 2021-01-29 NOTE — Progress Notes (Signed)
Pharmacy: Dofetilide (Tikosyn) - Initial Consult Assessment and Electrolyte Replacement  Pharmacy consulted to assist in monitoring and replacing electrolytes in this 55 y.o. male admitted on 01/27/2021 undergoing dofetilide initiation.   Assessment:  Patient Exclusion Criteria: If any screening criteria checked as "Yes", then  patient  should NOT receive dofetilide until criteria item is corrected.  If "Yes" please indicate correction plan.  YES  NO Patient  Exclusion Criteria Correction Plan   [x]   []   Baseline QTc interval is greater than or equal to 440 msec. IF above YES box checked dofetilide contraindicated unless patient has ICD; then may proceed if QTc 500-550 msec or with known ventricular conduction abnormalities may proceed with QTc 550-600 msec. QTc = 446 Continue Tikosyn with close monitoring   []   [x]   Patient is known or suspected to have a digoxin level greater than 2 ng/ml: No results found for: DIGOXIN     []   [x]   Creatinine clearance less than 20 ml/min (calculated using Cockcroft-Gault, actual body weight and serum creatinine): Estimated Creatinine Clearance: 97.9 mL/min (by C-G formula based on SCr of 1.23 mg/dL).     []   [x]  Patient has received drugs known to prolong the QT intervals within the last 48 hours (phenothiazines, tricyclics or tetracyclic antidepressants, erythromycin, H-1 antihistamines, cisapride, fluoroquinolones, azithromycin, ondansetron).   Updated information on QT prolonging agents is available to be searched on the following database:QT prolonging agents     []   [x]   Patient received a dose of hydrochlorothiazide (Oretic) alone or in any combination including triamterene (Dyazide, Maxzide) in the last 48 hours.    []   [x]  Patient received a medication known to increase dofetilide plasma concentrations prior to initial dofetilide dose:  . Trimethoprim (Primsol, Proloprim) in the last 36 hours . Verapamil (Calan, Verelan) in the  last 36 hours or a sustained release dose in the last 72 hours . Megestrol (Megace) in the last 5 days  . Cimetidine (Tagamet) in the last 6 hours . Ketoconazole (Nizoral) in the last 24 hours . Itraconazole (Sporanox) in the last 48 hours  . Prochlorperazine (Compazine) in the last 36 hours     []   [x]   Patient is known to have a history of torsades de pointes; congenital or acquired long QT syndromes.    []   [x]   Patient has received a Class 1 antiarrhythmic with less than 2 half-lives since last dose. (Disopyramide, Quinidine, Procainamide, Lidocaine, Mexiletine, Flecainide, Propafenone)    []   [x]   Patient has received amiodarone therapy in the past 3 months or amiodarone level is greater than 0.3 ng/ml.    Patient has been appropriately anticoagulated with apixaban.  Labs:    Component Value Date/Time   K 3.9 01/28/2021 0039   MG 1.9 01/29/2021 0113     Plan: Potassium: -mg 2gm IV per MD  Magnesium: -kdur per MD   Thank you for allowing pharmacy to participate in this patient's care   , PharmD Clinical Pharmacist **Pharmacist phone directory can now be found on [email or web address] ([email or web address]).  Listed under [facility].

## 2021-01-29 NOTE — Plan of Care (Signed)
Electrolyte reviewed Mag 1.9, will replace 2g IV Mag and optimize K 20 MEQ, in anticipation of Tikosyn load.

## 2021-01-29 NOTE — Anesthesia Postprocedure Evaluation (Signed)
Anesthesia Post Note  Patient: Joe Morrison  Procedure(s) Performed: TRANSESOPHAGEAL ECHOCARDIOGRAM (TEE) (N/A ) CARDIOVERSION (N/A )     Patient location during evaluation: Endoscopy Anesthesia Type: General Level of consciousness: awake and alert Pain management: pain level controlled Vital Signs Assessment: post-procedure vital signs reviewed and stable Respiratory status: spontaneous breathing, nonlabored ventilation and respiratory function stable Cardiovascular status: blood pressure returned to baseline and stable Postop Assessment: no apparent nausea or vomiting Anesthetic complications: no   No complications documented.  Last Vitals:  Vitals:   01/29/21 0925 01/29/21 0957  BP: 111/74 (!) 147/77  Pulse: 91   Resp: 11   Temp:  36.4 C  SpO2: 94%     Last Pain:  Vitals:   01/29/21 0957  TempSrc: Oral  PainSc:                  Keidy Thurgood,W. EDMOND

## 2021-01-29 NOTE — Progress Notes (Signed)
Heart Failure Nurse Navigator Progress Note  PCP: Agapito Games, MD PCP-Cardiologist: Jon Billings., MD (NEW) Admission Diagnosis: AF Admitted from: home alone  Presentation:   Joe Morrison presented with SOB, found to be in AF RVR. DCCV 4/20-failed. Plan to start Tikosyn today and DCCV 4/22, then DC Saturday 4/23. Pt sitting in bed with friend at bedside. Pt interactive with interview process. Pt was a heavy drinker, smoker and significant substance abuse--quit in 2000/2001 with the help of AA. Still has strong social support within the organization. No SDoH needs identified.   Pt excited to have Dr. Shari Prows as new cardiologist. Delma Freeze concern pt has is the amount of liquid he drinks in a day- drinks at least 1 pot of coffee and then several Cherry Dr. Reino Kent Zero. Explained modifications with sodium 2000mg /day and fluid intake to 2030mL/day.  ECHO/ LVEF: 35-40%, LA severely dilated, RA mildly dilated. Moderate TVR, severe AVR.   Clinical Course:  Past Medical History:  Diagnosis Date  . Alcoholic (HCC)    recovering since 10/1999  . Hypertension      Social History   Socioeconomic History  . Marital status: Single    Spouse name: Not on file  . Number of children: 0  . Years of education: Not on file  . Highest education level: Some college, no degree  Occupational History  . Occupation: 11/1999  Tobacco Use  . Smoking status: Former Smoker    Packs/day: 2.00    Years: 16.00    Pack years: 32.00    Types: Cigarettes    Quit date: 04/27/1999    Years since quitting: 21.7  . Smokeless tobacco: Never Used  Vaping Use  . Vaping Use: Never used  Substance and Sexual Activity  . Alcohol use: No    Comment: Former 04/29/1999 in [employer]. former heavy drinker 20beers/night.  . Drug use: Not Currently    Types: Marijuana, MDMA (Ecstacy), Oxycodone    Comment: stopped in 2001  . Sexual activity: Not Currently    Partners: Male    Birth  control/protection: None  Other Topics Concern  . Not on file  Social History Narrative   Working out daily with a 2002. No partner   Social Determinants of Health   Financial Resource Strain: Low Risk   . Difficulty of Paying Living Expenses: Not hard at all  Food Insecurity: No Food Insecurity  . Worried About Psychologist, educational in the Last Year: Never true  . Ran Out of Food in the Last Year: Never true  Transportation Needs: No Transportation Needs  . Lack of Transportation (Medical): No  . Lack of Transportation (Non-Medical): No  Physical Activity: Sufficiently Active  . Days of Exercise per Week: 3 days  . Minutes of Exercise per Session: 60 min  Stress: Stress Concern Present  . Feeling of Stress : Rather much  Social Connections: Not on file    High Risk Criteria for Readmission and/or Poor Patient Outcomes:  Heart failure hospital admissions (last 6 months): 1   No Show rate: 46%  Difficult social situation: no  Demonstrates medication adherence: yes  Primary Language: English  Literacy level: able to read/write and comprehend.  Education Assessment and Provision:  Detailed education and instructions provided on heart failure disease management including the following:  Signs and symptoms of Heart Failure When to call the physician Importance of daily weights Low sodium diet Fluid restriction Medication management Anticipated future follow-up appointments  Patient education given on  each of the above topics.  Patient acknowledges understanding via teach back method and acceptance of all instructions.  Education Materials:  "Living Better With Heart Failure" Booklet, HF zone tool, & Daily Weight Tracker Tool.  Patient has scale at home: yes Patient has pill box at home: no, given from AHF clinic.   Barriers of Care:   -new HF dx -medication regimen  Considerations/Referrals:   Referral made to Heart Failure Pharmacist Stewardship: yes, to  see at [facility] visit. Potential high med cost. Referral made to Heart & Vascular TOC clinic: yes, Friday 4/29 @ 2pm  Items for Follow-up on DC/TOC: -medication optimization -medication compliance -dietary/fluid modification  Ozella Rocks, RN, BSN Heart Failure Nurse Navigator [phone]

## 2021-01-29 NOTE — Anesthesia Procedure Notes (Signed)
Procedure Name: MAC Date/Time: 01/29/2021 8:48 AM Performed by: Rande Brunt, CRNA Pre-anesthesia Checklist: Patient identified, Emergency Drugs available, Suction available and Patient being monitored Patient Re-evaluated:Patient Re-evaluated prior to induction Oxygen Delivery Method: Nasal cannula Preoxygenation: Pre-oxygenation with 100% oxygen Induction Type: IV induction Placement Confirmation: positive ETCO2 and CO2 detector Dental Injury: Teeth and Oropharynx as per pre-operative assessment

## 2021-01-30 ENCOUNTER — Encounter (HOSPITAL_COMMUNITY): Payer: Self-pay | Admitting: Cardiovascular Disease

## 2021-01-30 DIAGNOSIS — I351 Nonrheumatic aortic (valve) insufficiency: Secondary | ICD-10-CM

## 2021-01-30 DIAGNOSIS — I5021 Acute systolic (congestive) heart failure: Secondary | ICD-10-CM

## 2021-01-30 LAB — BASIC METABOLIC PANEL
Anion gap: 9 (ref 5–15)
BUN: 20 mg/dL (ref 6–20)
CO2: 22 mmol/L (ref 22–32)
Calcium: 9.2 mg/dL (ref 8.9–10.3)
Chloride: 106 mmol/L (ref 98–111)
Creatinine, Ser: 1.17 mg/dL (ref 0.61–1.24)
GFR, Estimated: >60 mL/min (ref >60)
Glucose, Bld: 103 mg/dL — ABNORMAL HIGH (ref 70–99)
Potassium: 4.3 mmol/L (ref 3.5–5.1)
Sodium: 137 mmol/L (ref 135–145)

## 2021-01-30 LAB — CBC
HCT: 42.7 % (ref 39.0–52.0)
Hemoglobin: 13.7 g/dL (ref 13.0–17.0)
MCH: 30.2 pg (ref 26.0–34.0)
MCHC: 32.1 g/dL (ref 30.0–36.0)
MCV: 94.1 fL (ref 80.0–100.0)
Platelets: 185 10*3/uL (ref 150–400)
RBC: 4.54 MIL/uL (ref 4.22–5.81)
RDW: 14.9 % (ref 11.5–15.5)
WBC: 7.4 10*3/uL (ref 4.0–10.5)
nRBC: 0 % (ref 0.0–0.2)

## 2021-01-30 LAB — MAGNESIUM: Magnesium: 2.1 mg/dL (ref 1.7–2.4)

## 2021-01-30 MED ORDER — FUROSEMIDE 40 MG PO TABS
40.0000 mg | ORAL_TABLET | Freq: Every day | ORAL | Status: DC
  Administered 2021-01-30: 40 mg via ORAL
  Filled 2021-01-30: qty 1

## 2021-01-30 MED ORDER — POTASSIUM CHLORIDE CRYS ER 20 MEQ PO TBCR
20.0000 meq | EXTENDED_RELEASE_TABLET | Freq: Every day | ORAL | Status: DC
  Administered 2021-01-30: 20 meq via ORAL
  Filled 2021-01-30: qty 1

## 2021-01-30 MED ORDER — POTASSIUM CHLORIDE CRYS ER 20 MEQ PO TBCR: 20.0000 meq | EXTENDED_RELEASE_TABLET | Freq: Once | ORAL | Status: DC

## 2021-01-30 NOTE — Progress Notes (Signed)
Morning EKG reviewed  Shows has converted to NSR at 88 bpm with stable QTc at ~450 ms.  Continue Tikosyn 500 mcg BID.   Pt will not require DCCV.   Continue tikosyn load. Potential d/c Saturday afternoon after am dose if QTc remains stable.    Graciella Freer, PA-C  Pager: [phone]  01/30/2021 11:38 AM

## 2021-01-30 NOTE — Progress Notes (Addendum)
Progress Note  Patient Name: Joe Morrison Date of Encounter: 01/30/2021  Primary Cardiologist: Meriam Sprague, MD  Subjective   Converted to NSR overnight. He said he could tell something was different. Felt overall better. This morning feels a little SOB. No CP. Still with some edema on board.  Inpatient Medications    Scheduled Meds: . apixaban  5 mg Oral BID  . dofetilide  500 mcg Oral BID  . metoprolol tartrate  25 mg Oral Q6H  . sacubitril-valsartan  1 tablet Oral BID  . sodium chloride flush  3 mL Intravenous Q12H   Continuous Infusions: . sodium chloride     PRN Meds: sodium chloride, acetaminophen, nitroGLYCERIN, ondansetron (ZOFRAN) IV, sodium chloride flush   Vital Signs    Vitals:   01/29/21 1600 01/29/21 1610 01/30/21 0319 01/30/21 0754  BP:  106/71 107/69 109/72  Pulse:  88 84 87  Resp: 20 20 15 15   Temp:  97.8 F (36.6 C) 97.7 F (36.5 C) 97.9 F (36.6 C)  TempSrc:  Oral Oral Oral  SpO2:   92% 97%  Weight:      Height:        Intake/Output Summary (Last 24 hours) at 01/30/2021 1103 Last data filed at 01/30/2021 0915 Gross per 24 hour  Intake 650 ml  Output --  Net 650 ml   Last 3 Weights 01/27/2021 06/24/2020 12/19/2018  Weight (lbs) 335 lb 320 lb 359 lb  Weight (kg) 151.955 kg 145.151 kg 162.841 kg     Telemetry    Converted to NSR overnight. One ventricular triplet this AM otherwise NSR - Personally Reviewed  EKG: NSR 88bpm low voltage QRS, nonspecific STTW changes, QTC 02/18/2019  Physical Exam   GEN: No acute distress.  HEENT: Normocephalic, atraumatic, sclera non-icteric. Neck: No JVD or bruits. Cardiac: RRR no murmurs, rubs, or gallops.  Respiratory: Slightly diminished but otherwise clear to auscultation bilaterally. Breathing is unlabored. GI: Soft, nontender, non-distended, BS +x 4. MS: no deformity. Extremities: No clubbing or cyanosis. 1+ BLE pitting edema. Distal pedal pulses are 2+ and equal bilaterally. Neuro:  AAOx3.  Follows commands. Psych:  Responds to questions appropriately with a normal affect.  Labs    High Sensitivity Troponin:   Recent Labs  Lab 01/27/21 1355 01/27/21 1614  TROPONINIHS 47* 47*      Cardiac EnzymesNo results for input(s): TROPONINI in the last 168 hours. No results for input(s): TROPIPOC in the last 168 hours.   Chemistry Recent Labs  Lab 01/27/21 1355 01/28/21 0039 01/29/21 1353 01/30/21 0156  NA 136 141 138 137  K 3.8 3.9 4.1 4.3  CL 103 109 106 106  CO2 21* 24 23 22   GLUCOSE 188* 116* 117* 103*  BUN 15 14 16 20   CREATININE 1.06 1.23 0.95 1.17  CALCIUM 10.0 9.8 9.4 9.2  PROT 7.7  --   --   --   ALBUMIN 4.2  --   --   --   AST 25  --   --   --   ALT 34  --   --   --   ALKPHOS 48  --   --   --   BILITOT 0.5  --   --   --   GFRNONAA >60 >60 >60 >60  ANIONGAP 12 8 9 9      Hematology Recent Labs  Lab 01/27/21 1355 01/28/21 0039 01/30/21 0156  WBC 9.4 7.5 7.4  RBC 4.92 4.43 4.54  HGB 15.2 13.8  13.7  HCT 44.8 41.4 42.7  MCV 91.1 93.5 94.1  MCH 30.9 31.2 30.2  MCHC 33.9 33.3 32.1  RDW 15.1 15.3 14.9  PLT 250 199 185    BNP Recent Labs  Lab 01/27/21 1355  BNP 475.5*     DDimer No results for input(s): DDIMER in the last 168 hours.   Radiology    ECHO TEE  Result Date: 01/29/2021    TRANSESOPHOGEAL ECHO REPORT   Patient Name:   Joe Morrison Date of Exam: 01/29/2021 Medical Rec #:  [id number]        Height: Accession #:    [id number]       Weight: Date of Birth:  07-19-1966        BSA: Patient Age:    55 years         BP:           147/77 mmHg Patient Gender: M                HR:           105 bpm. Exam Location:  Inpatient Procedure: Transesophageal Echo, 3D Echo and Color Doppler Indications:     I48.91* Unspeicified atrial fibrillation  History:         Patient has prior history of Echocardiogram examinations, most                  recent 01/28/2021. CHF, Arrythmias:Atrial Fibrillation; Risk                  Factors:Hypertension and ETOH.   Sonographer:     Eulah PontSarah Pirrotta RDCS Referring Phys:  [id number] Manson PasseyBHAVINKUMAR BHAGAT Diagnosing Phys: Charlton HawsPeter Nishan MD PROCEDURE: After discussion of the risks and benefits of a TEE, an informed consent was obtained from the patient. The transesophogeal probe was passed without difficulty through the esophogus of the patient. Local oropharyngeal anesthetic was provided with Cetacaine. Sedation performed by different physician. The patient was monitored while under deep sedation. Anesthestetic sedation was provided intravenously by Anesthesiology: 117.6mg  of Propofol, 60mg  of Lidocaine. The patient's vital signs; including heart rate, blood pressure, and oxygen saturation; remained stable throughout the procedure. The patient developed no complications during the procedure. An unsuccessful direct current cardioversion was performed at 200 joules with 4. IMPRESSIONS  1. DCC attempted x 4 with 200J biphasic and AP manual compression Patient failed to convert to sinus.  2. Left ventricular ejection fraction, by estimation, is 30 to 35%. The left ventricle has moderately decreased function. The left ventricle demonstrates global hypokinesis. The left ventricular internal cavity size was severely dilated.  3. Right ventricular systolic function is normal. The right ventricular size is normal.  4. Extensive 3D imaging of LAA performed. Left atrial size was severely dilated. No left atrial/left atrial appendage thrombus was detected.  5. Right atrial size was mildly dilated.  6. 3 D imaging MV performed . The mitral valve is normal in structure. Mild mitral valve regurgitation.  7. Tricuspid valve regurgitation is moderate.  8. The aortic valve is tricuspid. Aortic valve regurgitation is severe. FINDINGS  Left Ventricle: Left ventricular ejection fraction, by estimation, is 30 to 35%. The left ventricle has moderately decreased function. The left ventricle demonstrates global hypokinesis. The left ventricular internal cavity  size was severely dilated. There is no left ventricular hypertrophy. Right Ventricle: The right ventricular size is normal. Right vetricular wall thickness was not assessed. Right ventricular systolic function is normal. Left Atrium: Extensive 3D imaging of LAA  performed. Left atrial size was severely dilated. No left atrial/left atrial appendage thrombus was detected. Right Atrium: Right atrial size was mildly dilated. Pericardium: There is no evidence of pericardial effusion. Mitral Valve: 3 D imaging MV performed. The mitral valve is normal in structure. Mild mitral valve regurgitation. Tricuspid Valve: The tricuspid valve is normal in structure. Tricuspid valve regurgitation is moderate. Aortic Valve: The aortic valve is tricuspid. Aortic valve regurgitation is severe. Pulmonic Valve: The pulmonic valve was normal in structure. Pulmonic valve regurgitation is mild. Aorta: The aortic root is normal in size and structure. IAS/Shunts: No atrial level shunt detected by color flow Doppler. Additional Comments: DCC attempted x 4 with 200J biphasic and AP manual compression Patient failed to convert to sinus. Charlton Haws MD Electronically signed by Charlton Haws MD Signature Date/Time: 01/29/2021/11:32:17 AM    Final     Cardiac Studies    Echo from 01/28/21:  1. Left ventricular ejection fraction, by estimation, is 35-40%. The left  ventricle has moderately decreased function. Left ventricular endocardial  border not optimally defined to evaluate regional wall motion, even with  Definity contrast. There is mild  concentric left ventricular hypertrophy. Left ventricular diastolic  function could not be evaluated.  2. Right ventricular systolic function is normal. The right ventricular  size is normal. There is mildly elevated pulmonary artery systolic  pressure.  3. Left atrial size was severely dilated.  4. Right atrial size was moderately dilated.  5. The mitral valve is normal in structure.  No evidence of mitral valve  regurgitation. No evidence of mitral stenosis.  6. The aortic valve is normal in structure. Aortic valve regurgitation is  not visualized. No aortic stenosis is present.  7. There is mild dilatation of the aortic root, measuring 46 mm. There is  mild dilatation of the ascending aorta, measuring 41 mm.  8. The inferior vena cava is dilated in size with <50% respiratory  variability, suggesting right atrial pressure of 15 mmHg.   TEE 01/29/2021:  1. DCC attempted x 4 with 200J biphasic and AP manual compression Patient failed to convert to sinus. 2. Left ventricular ejection fraction, by estimation, is 30 to 35%. The left ventricle has moderately decreased function. The left ventricle demonstrates global hypokinesis. The left ventricular internal cavity size was severely dilated. 3. Right ventricular systolic function is normal. The right ventricular size is normal. 4. Extensive 3D imaging of LAA performed. Left atrial size was severely dilated. No left atrial/left atrial appendage thrombus was detected. 5. Right atrial size was mildly dilated. 6. 3 D imaging MV performed . The mitral valve is normal in structure. Mild mitral valve regurgitation. 7. Tricuspid valve regurgitation is moderate. 8. The aortic valve is tricuspid. Aortic valve regurgitation is severe.  Patient Profile     55 y.o. male with HTN, morbid obesity, remote alcohol abuse (stopped drinking 21 years ago) admitted with new onset atrial fibrillation, combined CHF and severe aortic regurgitation.  Assessment & Plan    1. Newly diagnosed atrial fib with RVR - failed DCCV yesterday despite multiple attempts - EP now following, spontaneously converted to NSR on Tikosyn - continue metoprolol - will defer timing of consolidation to EP - continue anticoagulation with Eliquis - the copay note from 4/20 looks to be very expensive so placed order for care management to investigate cost options    2. New acute combined CHF - still looks volume up - per d/w MD, start oral Lasix 40mg  daily with KCl daily -  continue Entresto as BP allows - also same issue with care management consult pending as above for affordability - plan transition to Toprol when OK with EP - cardiomyopathy felt tachymediated but will need close follow-up as OP to reassess LV function. If EF remains down despite maintenance of NSR will need further ischemic workup. Cannot exclude prior contribution of ETOH but that was remote process.  3. Severe aortic regurgitation - not seen Echo but noted on TEE - echo did reveal mild dilatation of the aortic root, measuring 46 mm, mild dilatation of the ascending aorta, measuring 41 mm - per Dr. Antoine Poche no further workup recommended in house but will need to be followed as outpatient  4. Essential HTN - BP low/normal, follow in context of the above   5. Morbid obesity with suspected OSA - will need sleep study as OP  6. Elevated troponin - low/flat at 47-47, suspect demand ischemia  For questions or updates, please contact [facility] Please consult [email or web address] for contact info under Cardiology/STEMI.  Signed, Laurann Montana, PA-C 01/30/2021, 11:03 AM    History and all data above reviewed.  Patient examined.   Converted to NSR on his own last night with two doses of Tikosyn.  No pain or SOB.   I agree with the findings as above.  The patient exam reveals COR:RRR, no murmurs  ,  Lungs: Clear  ,  Abd: Positive bowel sounds, no rebound no guarding, Ext Mild edema  .  All available labs, radiology testing, previous records reviewed. Agree with documented assessment and plan.   Atrial fib:  Now NSR.  Continue current therapy.  Continue DOAC and I will decide in the future whether he needs this if EF comes back to normal.  CM:  Meds as on MAR and I will start a low dose diuretic and potassium scheduled.  Continue med titration as an outpatient.  AI:  I can neither hear  this or see it on TTE.  I will likely need to follow with TEE in the future.  He and I discussed this.    Fayrene Fearing Sakura Denis  11:17 AM  01/30/2021

## 2021-01-30 NOTE — Progress Notes (Addendum)
Electrophysiology Rounding Note  Patient Name: Joe Morrison Date of Encounter: 01/30/2021  Primary Cardiologist: Meriam Sprague, MD  Electrophysiologist: New to Dr. Ladona Ridgel  Subjective   Pt converted to sinus rhythm on Tikosyn 500 mcg BID   QTc from EKG last pm shows stable QTc at ~475 ms  The patient is doing well today.  At this time, the patient denies chest pain, shortness of breath, or any new concerns.  Inpatient Medications    Scheduled Meds: . apixaban  5 mg Oral BID  . dofetilide  500 mcg Oral BID  . metoprolol tartrate  25 mg Oral Q6H  . sacubitril-valsartan  1 tablet Oral BID  . sodium chloride flush  3 mL Intravenous Q12H   Continuous Infusions: . sodium chloride     PRN Meds: sodium chloride, acetaminophen, nitroGLYCERIN, ondansetron (ZOFRAN) IV, sodium chloride flush   Vital Signs    Vitals:   01/29/21 1600 01/29/21 1610 01/30/21 0319 01/30/21 0754  BP:  106/71 107/69 109/72  Pulse:  88 84 87  Resp: Temp:  97.8 F (36.6 C) 97.7 F (36.5 C) 97.9 F (36.6 C)  TempSrc:  Oral Oral Oral  SpO2:   92% 97%  Weight:      Height:        Intake/Output Summary (Last 24 hours) at 01/30/2021 0816 Last data filed at 01/29/2021 1541 Gross per 24 hour  Intake 550 ml  Output --  Net 550 ml   Filed Weights   01/27/21 1358  Weight: (!) 152 kg    Physical Exam    GEN- The patient is well appearing, alert and oriented x 3 today.   Head- normocephalic, atraumatic Eyes-  Sclera clear, conjunctiva pink Ears- hearing intact Oropharynx- clear Neck- supple Lungs- Clear to ausculation bilaterally, normal work of breathing Heart- Regular rate and rhythm, no murmurs, rubs or gallops GI- soft, NT, ND, + BS Extremities- no clubbing, cyanosis, or edema Skin- no rash or lesion Psych- euthymic mood, full affect Neuro- strength and sensation are intact  Labs    CBC Recent Labs    01/27/21 1355 01/28/21 0039 01/30/21 0156  WBC 9.4 7.5  7.4  NEUTROABS 5.9  --   --   HGB 15.2 13.8 13.7  HCT 44.8 41.4 42.7  MCV 91.1 93.5 94.1  PLT 250 199 185   Basic Metabolic Panel Recent Labs    16/10/96 0113 01/29/21 1353 01/30/21 0156  NA  --  138 137  K  --  4.1 4.3  CL  --  106 106  CO2  --  23 22  GLUCOSE  --  117* 103*  BUN  --  16 20  CREATININE  --  0.95 1.17  CALCIUM  --  9.4 9.2  MG 1.9  --  2.1    Potassium  Date/Time Value Ref Range Status  01/30/2021 01:56 AM 4.3 3.5 - 5.1 mmol/L Final   Magnesium  Date/Time Value Ref Range Status  01/30/2021 01:56 AM 2.1 1.7 - 2.4 mg/dL Final    Comment:    Performed at [facility], [address], [place], [place] [address]    Telemetry    Pt converted from AF 80-100s to NSR 70-80s after 2230 last night (personally reviewed)  Radiology    ECHOCARDIOGRAM COMPLETE  Result Date: 01/28/2021    ECHOCARDIOGRAM REPORT   Patient Name:   Joe Morrison Date of Exam: 01/28/2021 Medical Rec #:  [id number]  Height:       69.0 in Accession #:    [id number]       Weight:       335.0 lb Date of Birth:  05-20-1966        BSA:          2.572 m Patient Age:    54 years         BP:           98/62 mmHg Patient Gender: M                HR:           104 bpm. Exam Location:  Inpatient Procedure: 2D Echo, Cardiac Doppler, Color Doppler and Intracardiac            Opacification Agent Indications:    Atrrial fibrillation  History:        Patient has no prior history of Echocardiogram examinations.                 Risk Factors:Hypertension.  Sonographer:    Neomia Dear RDCS Referring Phys: [id number] [facility]  Sonographer Comments: Technically difficult study due to poor echo windows, suboptimal apical window and patient is morbidly obese. No cardiac surgery noted on chart IMPRESSIONS  1. Left ventricular ejection fraction, by estimation, is 35-40%. The left ventricle has moderately decreased function. Left ventricular endocardial border not optimally defined to evaluate  regional wall motion, even with Definity contrast. There is mild  concentric left ventricular hypertrophy. Left ventricular diastolic function could not be evaluated.  2. Right ventricular systolic function is normal. The right ventricular size is normal. There is mildly elevated pulmonary artery systolic pressure.  3. Left atrial size was severely dilated.  4. Right atrial size was moderately dilated.  5. The mitral valve is normal in structure. No evidence of mitral valve regurgitation. No evidence of mitral stenosis.  6. The aortic valve is normal in structure. Aortic valve regurgitation is not visualized. No aortic stenosis is present.  7. There is mild dilatation of the aortic root, measuring 46 mm. There is mild dilatation of the ascending aorta, measuring 41 mm.  8. The inferior vena cava is dilated in size with <50% respiratory variability, suggesting right atrial pressure of 15 mmHg. FINDINGS  Left Ventricle: Left ventricular ejection fraction, by estimation, is 35 to 40%. The left ventricle has moderately decreased function. Left ventricular endocardial border not optimally defined to evaluate regional wall motion. Definity contrast agent was given IV to delineate the left ventricular endocardial borders. The left ventricular internal cavity size was normal in size. There is mild concentric left ventricular hypertrophy. Left ventricular diastolic function could not be evaluated due to atrial fibrillation. Left ventricular diastolic function could not be evaluated. Right Ventricle: The right ventricular size is normal. No increase in right ventricular wall thickness. Right ventricular systolic function is normal. There is mildly elevated pulmonary artery systolic pressure. The tricuspid regurgitant velocity is 2.85  m/s, and with an assumed right atrial pressure of 3 mmHg, the estimated right ventricular systolic pressure is 35.5 mmHg. Left Atrium: Left atrial size was severely dilated. Right Atrium: Right  atrial size was moderately dilated. Pericardium: There is no evidence of pericardial effusion. Mitral Valve: The mitral valve is normal in structure. No evidence of mitral valve regurgitation. No evidence of mitral valve stenosis. Tricuspid Valve: The tricuspid valve is normal in structure. Tricuspid valve regurgitation is trivial. No evidence of tricuspid stenosis. Aortic Valve: The aortic  valve is normal in structure. Aortic valve regurgitation is not visualized. No aortic stenosis is present. Aortic valve mean gradient measures 6.0 mmHg. Aortic valve peak gradient measures 11.0 mmHg. Aortic valve area, by VTI measures 4.95 cm. Pulmonic Valve: The pulmonic valve was not well visualized. Pulmonic valve regurgitation is not visualized. No evidence of pulmonic stenosis. Aorta: The aortic root is normal in size and structure. There is mild dilatation of the aortic root, measuring 46 mm. There is mild dilatation of the ascending aorta, measuring 41 mm. Venous: The inferior vena cava is dilated in size with less than 50% respiratory variability, suggesting right atrial pressure of 15 mmHg. IAS/Shunts: No atrial level shunt detected by color flow Doppler.  LEFT VENTRICLE PLAX 2D LVIDd:         5.30 cm LVIDs:         4.80 cm LV PW:         1.40 cm LV IVS:        1.40 cm LVOT diam:     3.20 cm LV SV:         138 LV SV Index:   53 LVOT Area:     8.04 cm  RIGHT VENTRICLE TAPSE (M-mode): 1.3 cm LEFT ATRIUM              Index       RIGHT ATRIUM           Index LA diam:        5.10 cm  1.98 cm/m  RA Area:     25.80 cm LA Vol (A2C):   184.0 ml 71.53 ml/m RA Volume:   80.60 ml  31.33 ml/m LA Vol (A4C):   124.0 ml 48.20 ml/m LA Biplane Vol: 154.0 ml 59.87 ml/m  AORTIC VALVE                    PULMONIC VALVE AV Area (Vmax):    4.78 cm     PV Vmax:       0.56 m/s AV Area (Vmean):   4.98 cm     PV Vmean:      42.300 cm/s AV Area (VTI):     4.95 cm     PV VTI:        0.113 m AV Vmax:           166.00 cm/s  PV Peak grad:   1.3 mmHg AV Vmean:          119.000 cm/s PV Mean grad:  1.0 mmHg AV VTI:            0.278 m AV Peak Grad:      11.0 mmHg AV Mean Grad:      6.0 mmHg LVOT Vmax:         98.70 cm/s LVOT Vmean:        73.700 cm/s LVOT VTI:          0.171 m LVOT/AV VTI ratio: 0.62  AORTA Ao Root diam: 4.60 cm Ao Asc diam:  4.10 cm MITRAL VALVE               TRICUSPID VALVE MV Area (PHT): 6.51 cm    TR Peak grad:   32.5 mmHg MV Decel Time: 117 msec    TR Vmax:        285.00 cm/s MV E velocity: 96.40 cm/s  SHUNTS                            Systemic VTI:  0.17 m                            Systemic Diam: 3.20 cm Thurmon FairMihai Croitoru MD Electronically signed by Thurmon FairMihai Croitoru MD Signature Date/Time: 01/28/2021/11:30:31 AM    Final    ECHO TEE  Result Date: 01/29/2021    TRANSESOPHOGEAL ECHO REPORT   Patient Name:   Melvenia BeamRICHARD L Schoening Date of Exam: 01/29/2021 Medical Rec #:  [id number]        Height: Accession #:    [id number]       Weight: Date of Birth:  Nov 24, 1965        BSA: Patient Age:    54 years         BP:           147/77 mmHg Patient Gender: M                HR:           105 bpm. Exam Location:  Inpatient Procedure: Transesophageal Echo, 3D Echo and Color Doppler Indications:     I48.91* Unspeicified atrial fibrillation  History:         Patient has prior history of Echocardiogram examinations, most                  recent 01/28/2021. CHF, Arrythmias:Atrial Fibrillation; Risk                  Factors:Hypertension and ETOH.  Sonographer:     Eulah PontSarah Pirrotta RDCS Referring Phys:  [id number] Manson PasseyBHAVINKUMAR BHAGAT Diagnosing Phys: Charlton HawsPeter Nishan MD PROCEDURE: After discussion of the risks and benefits of a TEE, an informed consent was obtained from the patient. The transesophogeal probe was passed without difficulty through the esophogus of the patient. Local oropharyngeal anesthetic was provided with Cetacaine. Sedation performed by different physician. The patient was monitored while under deep sedation. Anesthestetic  sedation was provided intravenously by Anesthesiology: 117.6mg  of Propofol, 60mg  of Lidocaine. The patient's vital signs; including heart rate, blood pressure, and oxygen saturation; remained stable throughout the procedure. The patient developed no complications during the procedure. An unsuccessful direct current cardioversion was performed at 200 joules with 4. IMPRESSIONS  1. DCC attempted x 4 with 200J biphasic and AP manual compression Patient failed to convert to sinus.  2. Left ventricular ejection fraction, by estimation, is 30 to 35%. The left ventricle has moderately decreased function. The left ventricle demonstrates global hypokinesis. The left ventricular internal cavity size was severely dilated.  3. Right ventricular systolic function is normal. The right ventricular size is normal.  4. Extensive 3D imaging of LAA performed. Left atrial size was severely dilated. No left atrial/left atrial appendage thrombus was detected.  5. Right atrial size was mildly dilated.  6. 3 D imaging MV performed . The mitral valve is normal in structure. Mild mitral valve regurgitation.  7. Tricuspid valve regurgitation is moderate.  8. The aortic valve is tricuspid. Aortic valve regurgitation is severe. FINDINGS  Left Ventricle: Left ventricular ejection fraction, by estimation, is 30 to 35%. The left ventricle has moderately decreased function. The left ventricle demonstrates global hypokinesis. The left ventricular internal cavity size was severely dilated. There is no left ventricular hypertrophy. Right Ventricle: The right ventricular size is normal. Right vetricular  wall thickness was not assessed. Right ventricular systolic function is normal. Left Atrium: Extensive 3D imaging of LAA performed. Left atrial size was severely dilated. No left atrial/left atrial appendage thrombus was detected. Right Atrium: Right atrial size was mildly dilated. Pericardium: There is no evidence of pericardial effusion. Mitral Valve:  3 D imaging MV performed. The mitral valve is normal in structure. Mild mitral valve regurgitation. Tricuspid Valve: The tricuspid valve is normal in structure. Tricuspid valve regurgitation is moderate. Aortic Valve: The aortic valve is tricuspid. Aortic valve regurgitation is severe. Pulmonic Valve: The pulmonic valve was normal in structure. Pulmonic valve regurgitation is mild. Aorta: The aortic root is normal in size and structure. IAS/Shunts: No atrial level shunt detected by color flow Doppler. Additional Comments: DCC attempted x 4 with 200J biphasic and AP manual compression Patient failed to convert to sinus. Charlton Haws MD Electronically signed by Charlton Haws MD Signature Date/Time: 01/29/2021/11:32:17 AM    Final      Patient Profile     MYRON STANKOVICH is a 55 y.o. male with a past medical history significant for persistent atrial fibrillation.  They were admitted for tikosyn load.   Assessment & Plan    1. Persistent atrial fibrillation Pt converted to sinus rhythm on Tikosyn 500 mcg BID  Continue Eliquis Electrolytes stable. K 4.3, Mg 2.1 CHA2DS2VASC is at least 1.  2. Acute combined systolic/diastolic CHF Remains somewhat orthopneic.  Diuresis and GDMT per primary.  Ischemic work up as outpatient if EF does not improve with GDMT and NSR.   3. Suspected sleep apnea Body mass index is 49.47 kg/m.  Will need outpatient sleep study and weight loss.   For questions or updates, please contact [facility] Please consult [email or web address] for contact info under Cardiology/STEMI.  Signed, Graciella Freer, PA-C  01/30/2021, 8:16 AM   EP Attending  Patient seen and examined. Agree with above. He has reverted to NSR and his QT looks good. He will need to continue dofetilide. We will follow his QT and renal function. I think that there is a good chance his EF will improve if he stays in rhythm. By history he has sleep apnea but this will need to be confirmed with a sleep  study. His morbid obesity will need attending to as well.   Sharlot Gowda Elani Delph,MD

## 2021-01-30 NOTE — TOC Initial Note (Signed)
Transition of Care (TOC) - Initial/Assessment Note  Marvetta Gibbons RN, BSN Transitions of Care Unit 4E- RN Case Manager See Treatment Team for direct phone #    Patient Details  Name: Joe Morrison MRN: [id number] Date of Birth: 1966-01-29  Transition of Care [facility]) CM/SW Contact:    Dawayne Patricia, RN Phone Number: 01/30/2021, 1:18 PM  Clinical Narrative:                 Pt admitted from home with new afib, failed initial DCCV, started on Tikosyn, benefits check completed on Tikosyn, Eliquis, and Entrestro. Pt has not met deductible or out of pocket cost for the year at this time and therefor cost for drugs at high copay.  CM spoke with pt at bedside- pt is aware of his out of pocket max which he states is $2800/yr. He also reports he has an HSA that he uses to pay for his out of pocket medical expenses. Discussed with pt high dollar cost for his new meds to begin with- pt states that the cost would not be a problem or burden, and after his out of pocket is met then he is aware that the cost for meds would drop (per pt he has a self insured plan which he selects the meds on his list, and just has to be sure he makes sure to go in and add the new meds). Pt seems very versed on his health insurance plan and coverage.  Pt voices that he is ok with the estimated cost for the new medications.  Per pt his preferred pharmacy is [facility] in [place],  Would be ok to use TOC to fill meds if possible for initial fills- would need to send new scripts there by Friday to fill if pt not going to be discharged until the weekend as TOC is not open over the Weekend.   Expected Discharge Plan: Home/Self Care Barriers to Discharge: Continued Medical Work up   Patient Goals and CMS Choice Patient states their goals for this hospitalization and ongoing recovery are:: return home   Choice offered to / list presented to : NA  Expected Discharge Plan and Services Expected Discharge  Plan: Home/Self Care   Discharge Planning Services: Medication Assistance   Living arrangements for the past 2 months: Single Family Home                                      Prior Living Arrangements/Services Living arrangements for the past 2 months: Single Family Home Lives with:: Self Patient language and need for interpreter reviewed:: Yes Do you feel safe going back to the place where you live?: Yes      Need for Family Participation in Patient Care: Yes (Comment) Care giver support system in place?: Yes (comment)   Criminal Activity/Legal Involvement Pertinent to Current Situation/Hospitalization: No - Comment as needed  Activities of Daily Living      Permission Sought/Granted                  Emotional Assessment Appearance:: Appears stated age Attitude/Demeanor/Rapport: Engaged Affect (typically observed): Appropriate,Pleasant Orientation: : Oriented to Self,Oriented to Place,Oriented to  Time,Oriented to Situation Alcohol / Substance Use: Not Applicable Psych Involvement: No (comment)  Admission diagnosis:  Acute pulmonary edema ([place]) [J81.0] New onset atrial fibrillation ([place]) [I48.91] Atrial fibrillation with RVR ([place]) [I48.91] Patient Active Problem List  Diagnosis Date Noted  . Chronic combined systolic and diastolic heart failure ([place]) 01/29/2021  . Aortic regurgitation 01/29/2021  . Atrial fibrillation with RVR ([place]) 01/27/2021  . Depression, major, single episode, mild ([place]) 06/24/2020  . Severe obesity (BMI >= 40) ([place]) 10/23/2013  . ED (erectile dysfunction) 05/02/2012  . HYPERTENSION, BENIGN SYSTEMIC 07/20/2006   PCP:  Hali Marry, MD Pharmacy:   [place], [address] [place] [address] Phone: [phone] Fax: [place], [place] [facility] [address] [place] [address] Phone: [phone] Fax: [phone]  [facility] [address] [place] [address] Phone: [phone] Fax: [phone]     Social Determinants of Health (SDOH) Interventions Food Insecurity Interventions: Intervention Not Indicated Financial Strain Interventions: Intervention Not Indicated Housing Interventions: Intervention Not Indicated Physical Activity Interventions: Intervention Not Indicated Stress Interventions: Intervention Not Indicated Transportation Interventions: Intervention Not Indicated  Readmission Risk Interventions No flowsheet data found.

## 2021-01-30 NOTE — Progress Notes (Addendum)
Per nurse, care manager said pt had not yet met deductible therefore rxs expensive - not clear to me whether this is attainable for patient. Asked nurse to have care management review with patient and place usual formal CM note regarding rx coverage and whether patient feels this is not sustainable. Also asked to look into Xarelto as well. Per Dr. Percival Spanish continue current regimen but will review in rounds tomorrow.  Update - per nurse, patient is well versed in his insurance plan and per their discussion can choose which are covered, will meet his deductible with this hospitalization, and therefore he does not think the current medicines will be an issue to continue as OP. Therefore continue current regimen. Aaralyn Kil PA-C

## 2021-01-31 ENCOUNTER — Encounter (HOSPITAL_COMMUNITY)
Admission: EM | Disposition: A | Discharge status: Home / Self Care | Payer: Self-pay | Source: Home / Self Care | Attending: Internal Medicine

## 2021-01-31 DIAGNOSIS — I5041 Acute combined systolic (congestive) and diastolic (congestive) heart failure: Secondary | ICD-10-CM

## 2021-01-31 LAB — BASIC METABOLIC PANEL
Anion gap: 9 (ref 5–15)
BUN: 28 mg/dL — ABNORMAL HIGH (ref 6–20)
CO2: 23 mmol/L (ref 22–32)
Calcium: 9.5 mg/dL (ref 8.9–10.3)
Chloride: 106 mmol/L (ref 98–111)
Creatinine, Ser: 1.41 mg/dL — ABNORMAL HIGH (ref 0.61–1.24)
GFR, Estimated: 58 mL/min — ABNORMAL LOW (ref >60)
Glucose, Bld: 109 mg/dL — ABNORMAL HIGH (ref 70–99)
Potassium: 4.2 mmol/L (ref 3.5–5.1)
Sodium: 138 mmol/L (ref 135–145)

## 2021-01-31 LAB — MAGNESIUM: Magnesium: 2.2 mg/dL (ref 1.7–2.4)

## 2021-01-31 SURGERY — CARDIOVERSION
Anesthesia: General | Laterality: Left

## 2021-01-31 SURGERY — CARDIOVERSION
Anesthesia: General

## 2021-01-31 MED ORDER — OFF THE BEAT BOOK
Freq: Once | Status: AC
  Filled 2021-01-31: qty 1

## 2021-01-31 MED ORDER — POLYETHYLENE GLYCOL 3350 17 G PO PACK
17.0000 g | PACK | Freq: Every day | ORAL | Status: DC
  Filled 2021-01-31: qty 1

## 2021-01-31 MED ORDER — SACUBITRIL-VALSARTAN 24-26 MG PO TABS
1.0000 | ORAL_TABLET | Freq: Two times a day (BID) | ORAL | Status: DC
  Administered 2021-01-31 – 2021-02-01 (×2): 1 via ORAL
  Filled 2021-01-31 (×2): qty 1

## 2021-01-31 NOTE — Progress Notes (Addendum)
Morning EKG reviewed  Shows remains in NSR at 81 bpm with stable QTc at ~450 ms.  Continue Tikosyn 500 mcg BID.   Please note the TOC can NOT be utilized on the weekends  The patients preferred pharmacy, [facility] in [place] at the corner of [address], has both the 500 mcg and 250 mcg in stock should patient require down titration.   Graciella Freer, PA-C  Pager: [phone]  01/31/2021 11:43 AM

## 2021-01-31 NOTE — Progress Notes (Signed)
Pt having a lot of anxiety regarding persistent SOB and general plan of care.  He is awaiting card MD to follow up with initial NP rounds.    Pt assisted with ambulation in hallway, walked approximately 175' on RA with front wheel walker.  SpO2 remained above 90% for the duration of the walk, though pt was a bit SOB and tachypneic.  Back to recliner in room.  No complaints and actually expressed that he was more reassured and that he "did better than I thought I would".  BP 129/72, HR 89, SpO2 100% RA.

## 2021-01-31 NOTE — Progress Notes (Signed)
Dr. Adolm Joseph  On call for Cardiology and aware of W.C.T. 25 beats and Q.T.C.'s 0.495 tonight and was started on Tikosyn on 01-28-21. No orders given at this time.

## 2021-01-31 NOTE — Progress Notes (Signed)
Patient made aware of Education [employer] . Patient watch video on At. Fib. CHF and Tikoysn . Also gave book on At. Fib. And CHF.

## 2021-01-31 NOTE — Progress Notes (Addendum)
Electrophysiology Rounding Note  Patient Name: Joe Morrison Date of Encounter: 01/31/2021  Primary Cardiologist: Meriam Sprague, MD  Electrophysiologist: Dr. Ladona Ridgel   Subjective   Pt converted to sinus rhythm on Tikosyn 500 mcg BID   QTc from EKG last pm shows stable QTc at ~470 ms when measured manually.  The patient is doing well today.  At this time, the patient denies chest pain, shortness of breath, or any new concerns.  Inpatient Medications    Scheduled Meds: . apixaban  5 mg Oral BID  . dofetilide  500 mcg Oral BID  . furosemide  40 mg Oral Daily  . metoprolol tartrate  25 mg Oral Q6H  . potassium chloride  20 mEq Oral Daily  . sacubitril-valsartan  1 tablet Oral BID  . sodium chloride flush  3 mL Intravenous Q12H   Continuous Infusions: . sodium chloride     PRN Meds: sodium chloride, acetaminophen, nitroGLYCERIN, sodium chloride flush   Vital Signs    Vitals:   01/30/21 1813 01/30/21 1930 01/30/21 2338 01/31/21 0335  BP: 105/68 106/70 110/68 100/66  Pulse: 81 81 76 73  Resp: (!) 24 20 16 20   Temp: 98.1 F (36.7 C) 97.9 F (36.6 C) 97.9 F (36.6 C) 97.7 F (36.5 C)  TempSrc: Oral Oral Oral Oral  SpO2: 98% 98% 96% 96%  Weight:    (!) 157.4 kg  Height:        Intake/Output Summary (Last 24 hours) at 01/31/2021 0740 Last data filed at 01/31/2021 02/02/2021 Gross per 24 hour  Intake 583 ml  Output 525 ml  Net 58 ml   Filed Weights   01/27/21 1358 01/31/21 0335  Weight: (!) 152 kg (!) 157.4 kg    Physical Exam    GEN- The patient is well appearing, alert and oriented x 3 today.   Head- normocephalic, atraumatic Eyes-  Sclera clear, conjunctiva pink Ears- hearing intact Oropharynx- clear Neck- supple Lungs- Clear to ausculation bilaterally, normal work of breathing Heart- Regular rate and rhythm, no murmurs, rubs or gallops GI- soft, NT, ND, + BS Extremities- no clubbing, cyanosis, or edema Skin- no rash or lesion Psych- euthymic  mood, full affect Neuro- strength and sensation are intact  Labs    CBC Recent Labs    01/30/21 0156  WBC 7.4  HGB 13.7  HCT 42.7  MCV 94.1  PLT 185   Basic Metabolic Panel Recent Labs    02/01/21 0156 01/31/21 0009  NA 137 138  K 4.3 4.2  CL 106 106  CO2 22 23  GLUCOSE 103* 109*  BUN 20 28*  CREATININE 1.17 1.41*  CALCIUM 9.2 9.5  MG 2.1 2.2    Potassium  Date/Time Value Ref Range Status  01/31/2021 12:09 AM 4.2 3.5 - 5.1 mmol/L Final   Magnesium  Date/Time Value Ref Range Status  01/31/2021 12:09 AM 2.2 1.7 - 2.4 mg/dL Final    Comment:    Performed at [facility], [address], [place], [place]    Telemetry    NSR 70-80s, 21 beat run of monomorphic VT just after 0000 (personally reviewed)  Radiology    ECHO TEE  Result Date: 01/29/2021    TRANSESOPHOGEAL ECHO REPORT   Patient Name:   Joe Morrison Date of Exam: 01/29/2021 Medical Rec #:  01/31/2021        Height: Accession #:    [id number]       Weight: Date of Birth:  [birth year]        BSA: Patient Age:    54 years         BP:           147/77 mmHg Patient Gender: M                HR:           105 bpm. Exam Location:  Inpatient Procedure: Transesophageal Echo, 3D Echo and Color Doppler Indications:     I48.91* Unspeicified atrial fibrillation  History:         Patient has prior history of Echocardiogram examinations, most                  recent 01/28/2021. CHF, Arrythmias:Atrial Fibrillation; Risk                  Factors:Hypertension and ETOH.  Sonographer:     Eulah Pont RDCS Referring Phys:  [id number] Manson Passey Diagnosing Phys: Charlton Haws MD PROCEDURE: After discussion of the risks and benefits of a TEE, an informed consent was obtained from the patient. The transesophogeal probe was passed without difficulty through the esophogus of the patient. Local oropharyngeal anesthetic was provided with Cetacaine. Sedation performed by different physician. The patient was monitored  while under deep sedation. Anesthestetic sedation was provided intravenously by Anesthesiology: 117.6mg  of Propofol, 60mg  of Lidocaine. The patient's vital signs; including heart rate, blood pressure, and oxygen saturation; remained stable throughout the procedure. The patient developed no complications during the procedure. An unsuccessful direct current cardioversion was performed at 200 joules with 4. IMPRESSIONS  1. DCC attempted x 4 with 200J biphasic and AP manual compression Patient failed to convert to sinus.  2. Left ventricular ejection fraction, by estimation, is 30 to 35%. The left ventricle has moderately decreased function. The left ventricle demonstrates global hypokinesis. The left ventricular internal cavity size was severely dilated.  3. Right ventricular systolic function is normal. The right ventricular size is normal.  4. Extensive 3D imaging of LAA performed. Left atrial size was severely dilated. No left atrial/left atrial appendage thrombus was detected.  5. Right atrial size was mildly dilated.  6. 3 D imaging MV performed . The mitral valve is normal in structure. Mild mitral valve regurgitation.  7. Tricuspid valve regurgitation is moderate.  8. The aortic valve is tricuspid. Aortic valve regurgitation is severe. FINDINGS  Left Ventricle: Left ventricular ejection fraction, by estimation, is 30 to 35%. The left ventricle has moderately decreased function. The left ventricle demonstrates global hypokinesis. The left ventricular internal cavity size was severely dilated. There is no left ventricular hypertrophy. Right Ventricle: The right ventricular size is normal. Right vetricular wall thickness was not assessed. Right ventricular systolic function is normal. Left Atrium: Extensive 3D imaging of LAA performed. Left atrial size was severely dilated. No left atrial/left atrial appendage thrombus was detected. Right Atrium: Right atrial size was mildly dilated. Pericardium: There is no  evidence of pericardial effusion. Mitral Valve: 3 D imaging MV performed. The mitral valve is normal in structure. Mild mitral valve regurgitation. Tricuspid Valve: The tricuspid valve is normal in structure. Tricuspid valve regurgitation is moderate. Aortic Valve: The aortic valve is tricuspid. Aortic valve regurgitation is severe. Pulmonic Valve: The pulmonic valve was normal in structure. Pulmonic valve regurgitation is mild. Aorta: The aortic root is normal in size and structure. IAS/Shunts: No atrial level shunt detected by color flow Doppler. Additional Comments: DCC attempted x 4 with 200J biphasic and AP  manual compression Patient failed to convert to sinus. Charlton Haws MD Electronically signed by Charlton Haws MD Signature Date/Time: 01/29/2021/11:32:17 AM    Final      Patient Profile     AMORY SIMONETTI is a 55 y.o. male with a past medical history significant for persistent atrial fibrillation.  They were admitted for tikosyn load.   Assessment & Plan    1. Persistent atrial fibrillation Pt converted to sinus rhythm on Tikosyn 500 mcg BID  Continue Eliquis Electrolytes stable.  CHA2DS2VASC is at least 1.  2. 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 Continue GDMT  3. Acute systolic CHF Diuresis and GDMT per primary.  Ischemic work up as outpatient if EF does not improve with GDMT and NSR.   Would plan for home tomorrow if QTc remains stable after am dose.   For questions or updates, please contact [facility] Please consult [email or web address] for contact info under Cardiology/STEMI.  Signed, Graciella Freer, PA-C  01/31/2021, 7:40 AM   EP Attending  Patient seen and examined. Agree with above. The patient has a corrected QT around 460. He has had NSVT which is monomorphic. He will continue dofetilide. DC home tomorrow if QT is ok.  Sharlot Gowda Jaymarion Trombly,MD

## 2021-01-31 NOTE — Progress Notes (Addendum)
Progress Note  Patient Name: Melvenia BeamRichard L Mongiello Date of Encounter: 01/31/2021  [facility] Cardiologist: Meriam SpragueHeather E Pemberton, MD   Subjective   Patient states he is feeling unchanged, slight SOB with ambulation to bathroom, has not walked too much in the hall and wishes to do more today. He voiced frustration regarding prolonged hospital stay and concern of expensive cost and not happy hearing that his kidney function is affected. He denied any chest pain or pressure, dizziness, syncope, heart palpitation. He states he may leave tomorrow regardless but willing to stay today. He is urinating without issue. He wishes stool softener to be ordered.   Inpatient Medications    Scheduled Meds: . apixaban  5 mg Oral BID  . dofetilide  500 mcg Oral BID  . metoprolol tartrate  25 mg Oral Q6H  . polyethylene glycol  17 g Oral Daily  . sodium chloride flush  3 mL Intravenous Q12H   Continuous Infusions: . sodium chloride     PRN Meds: sodium chloride, acetaminophen, nitroGLYCERIN, sodium chloride flush   Vital Signs    Vitals:   01/30/21 1930 01/30/21 2338 01/31/21 0335 01/31/21 0817  BP: 106/70 110/68 100/66 123/72  Pulse: 81 76 73   Resp: 20 16 20  (!) 21  Temp: 97.9 F (36.6 C) 97.9 F (36.6 C) 97.7 F (36.5 C) 98 F (36.7 C)  TempSrc: Oral Oral Oral Tympanic  SpO2: 98% 96% 96% 96%  Weight:   (!) 157.4 kg   Height:        Intake/Output Summary (Last 24 hours) at 01/31/2021 0951 Last data filed at 01/31/2021 [id number] Gross per 24 hour  Intake 483 ml  Output 525 ml  Net -42 ml   Last 3 Weights 01/31/2021 01/27/2021 06/24/2020  Weight (lbs) 347 lb 1.6 oz 335 lb 320 lb  Weight (kg) 157.444 kg 151.955 kg 145.151 kg      Telemetry    Sinus rhythm with ventricular rate of 70s , 25 beats of NSVT noted x1, occasional PVCs, artifacts - Personally Reviewed  ECG    New tracing today is pending - Personally Reviewed  Physical Exam   GEN: No acute distress. Sitting in bed.  Morbidly obese Neck: No JVD Cardiac: RRR, no murmurs, rubs, or gallops.  Respiratory: Mild DOEClear to auscultation bilaterally. On Room air.  GI: Soft, nontender, non-distended  MS: Bilateral ankle edema noted; No deformity. Neuro:  Alert and oriented x3, follow commands appropriately, no focal deficit  Psych: Normal affect   Labs    High Sensitivity Troponin:   Recent Labs  Lab 01/27/21 1355 01/27/21 1614  TROPONINIHS 47* 47*      Chemistry Recent Labs  Lab 01/27/21 1355 01/28/21 0039 01/29/21 1353 01/30/21 0156 01/31/21 0009  NA 136   < > 138 137 138  K 3.8   < > 4.1 4.3 4.2  CL 103   < > 106 106 106  CO2 21*   < > 23 22 23   GLUCOSE 188*   < > 117* 103* 109*  BUN 15   < > 16 20 28*  CREATININE 1.06   < > 0.95 1.17 1.41*  CALCIUM 10.0   < > 9.4 9.2 9.5  PROT 7.7  --   --   --   --   ALBUMIN 4.2  --   --   --   --   AST 25  --   --   --   --   ALT 34  --   --   --   --  ALKPHOS 48  --   --   --   --   BILITOT 0.5  --   --   --   --   GFRNONAA >60   < > >60 >60 58*  ANIONGAP 12   < > 9 9 9    < > = values in this interval not displayed.     Hematology Recent Labs  Lab 01/27/21 1355 01/28/21 0039 01/30/21 0156  WBC 9.4 7.5 7.4  RBC 4.92 4.43 4.54  HGB 15.2 13.8 13.7  HCT 44.8 41.4 42.7  MCV 91.1 93.5 94.1  MCH 30.9 31.2 30.2  MCHC 33.9 33.3 32.1  RDW 15.1 15.3 14.9  PLT 250 199 185    BNP Recent Labs  Lab 01/27/21 1355  BNP 475.5*     DDimer No results for input(s): DDIMER in the last 168 hours.   Radiology    No results found.  Cardiac Studies   Echo from 01/28/21:  1. Left ventricular ejection fraction, by estimation, is 35-40%. The left  ventricle has moderately decreased function. Left ventricular endocardial  border not optimally defined to evaluate regional wall motion, even with  Definity contrast. There is mild  concentric left ventricular hypertrophy. Left ventricular diastolic  function could not be evaluated.  2. Right  ventricular systolic function is normal. The right ventricular  size is normal. There is mildly elevated pulmonary artery systolic  pressure.  3. Left atrial size was severely dilated.  4. Right atrial size was moderately dilated.  5. The mitral valve is normal in structure. No evidence of mitral valve  regurgitation. No evidence of mitral stenosis.  6. The aortic valve is normal in structure. Aortic valve regurgitation is  not visualized. No aortic stenosis is present.  7. There is mild dilatation of the aortic root, measuring 46 mm. There is  mild dilatation of the ascending aorta, measuring 41 mm.  8. The inferior vena cava is dilated in size with <50% respiratory  variability, suggesting right atrial pressure of 15 mmHg.   TEE 01/29/2021:  1. DCC attempted x 4 with 200J biphasic and AP manual compression Patient failed to convert to sinus. 2. Left ventricular ejection fraction, by estimation, is 30 to 35%. The left ventricle has moderately decreased function. The left ventricle demonstrates global hypokinesis. The left ventricular internal cavity size was severely dilated. 3. Right ventricular systolic function is normal. The right ventricular size is normal. 4. Extensive 3D imaging of LAA performed. Left atrial size was severely dilated. No left atrial/left atrial appendage thrombus was detected. 5. Right atrial size was mildly dilated. 6. 3 D imaging MV performed . The mitral valve is normal in structure. Mild mitral valve regurgitation. 7. Tricuspid valve regurgitation is moderate. 8. The aortic valve is tricuspid. Aortic valve regurgitation is severe.  Patient Profile     55 y.o. 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   Assessment & Plan    Newly diagnosed A fib with RVR: presented to  [facility] for worsening of progressive SOB x2 weeks, intermittent orthopnea, lower extremity edema, and fatigue. He required BIPAP support at ED and started on Cardizem gtt before transfer to [facility] for further management. BNP 475. Hs Trop flat 47 >47. CXR with cardiac enlargement with mild vascular congestion and interstitial edema. EKG showed  A Fib RVR 170s. Echo showed EF 35-40%, unable assess RWMA and diastolic function, RV normal, LA severely dilated,  RA moderately dilated,  mild dilatation of the aortic root, measuring 46 mm, mild dilatation of the ascending aorta, measuring 41 mm. - rate controlled is achieved by metoprolol 25mg  Q6H - s/p failed coversion from TEE with DCCV x4 this admission  - EP consulted, loading Tikosyn BID, converted to SR now , QTc 468 from yesterday, repeat EKG pending today  - TSH WNL; will replete electrolyte to keep K >4 and Mag >2 , at goal  - CHA2DS2VAScscore is 2 due to HTN, CHF; recommend anticoagulation, started on Eliquis 5mg  BID this admission   Newly diagnosed acute combined heart failure Pulmonary edema POA - c/o 2 weeks onset of orthopnea, leg edema, fatigue  - Echo 4/18 showed EF 35-40%, unable assess RWMA and diastolic function, RV normal, LA severely dilated,  RA moderately dilated, mild dilatation of the aortic root, measuring 46 mm, mild dilatation of the ascending aorta, measuring 41 mm. 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 +/- undiagnosed OSA  - Net + 454 ml over the past 24 hours, daily weight not recorded,  please monitor I&O and daily weight  - education provided on CHF signs and diet restriction  - started Entresto 24/26 BID for GDMT, held now due to AKI, may consider outpatient add/titration of aldactone and SGLT2i - will need repeat Echo outpatient to re-assess EF, may  need ischemic workup if remains depressed  AKI, new - Cr 1.41 today from baseline 0.95 ranges - CrCl 87 ml/min today, will hold Lasix, not meeting criteria for low dose Eliquis  - please monitor UOP , will trend daily renal index   NSVT - noted on telemetry - continue BBlocker   Severe Aortic regurgitation  - not seen Echo but noted on TEE , Echo did reveal mild dilatation of the aortic root, measuring 46 mm, mild dilatation of the ascending aorta, measuring 41 mm. - follow up outpatient with repeat Echo   Elevated troponin - Trop flat 47-47, suspect demand ischemia , no chest pain   HTN - appears taking metoprolol, Olmesartan, amlodipine, HCTZ at home  - BP low normal here  -currently on metoprolol as above   Morbid obesity  - discussed lifestyle modification  Snoring  - outpatient sleep study recommend for OSA evaluation    For questions or updates, please contact [facility] Please consult [email or web address] for contact info under        Signed, , NP  01/31/2021, 9:51 AM    History and all data above reviewed.  Patient examined.  I agree with the findings as above.  No complaints.  Ambulated with stable RA sats.  Maintaining NSR. The patient exam reveals COR:RRR  ,  Lungs: Clear  ,  Abd: Positive bowel sounds, no rebound no guarding, Ext Mild edema  .  All available labs, radiology testing, previous records reviewed. Agree with documented assessment and plan.   Atrial fib:  Continue Tikosyn.    Acute HF:  Held Entresto this AM with increased creat but I will resume this PM.  Hold diuretic for now.  We can determine the discharge regimen in the AM based on his BPs and creatinine.   Plan out patient ischemic work up if the  EF remains low.  Otherwise I am planning out patient sleep apnea work up and follow up of AI.   Fayrene Fearing Phares Zaccone  11:52 AM  01/31/2021

## 2021-01-31 NOTE — Progress Notes (Addendum)
Pharmacy: Dofetilide (Tikosyn) - Follow Up Assessment and Electrolyte Replacement  Pharmacy consulted to assist in monitoring and replacing electrolytes in this 55 y.o. male admitted on 01/27/2021 undergoing dofetilide initiation  Labs:    Component Value Date/Time   K 4.2 01/31/2021 0009   MG 2.2 01/31/2021 0009     Plan: Potassium: K >/= 4: No additional supplementation needed  Magnesium: Mg > 2: No additional supplementation needed  As patient has required on average ~10 mEq of potassium replacement every day, recommend discharging patient with prescription for:  -No supplemental dose at discharge recommended  Harland German, PharmD Clinical Pharmacist **Pharmacist phone directory can now be found on [email or web address] ([email or web address]).  Listed under [facility].

## 2021-01-31 NOTE — Progress Notes (Signed)
Patient setting in chair with no complaints and had 25 beats WCT then back to [facility] Leash r.N. aware will paged M,D. on call .

## 2021-01-31 NOTE — Progress Notes (Signed)
Pt ambulated 470', front wheel walker, room air. Continues to get a bit SOB but overall tolerated walk quite well.  Back to recliner in room. Wants to try another walk this afternoon without walk. Does not anticipate need for walker at home.

## 2021-02-01 DIAGNOSIS — I4729 Other ventricular tachycardia: Secondary | ICD-10-CM

## 2021-02-01 DIAGNOSIS — I5041 Acute combined systolic (congestive) and diastolic (congestive) heart failure: Secondary | ICD-10-CM

## 2021-02-01 DIAGNOSIS — I472 Ventricular tachycardia: Secondary | ICD-10-CM

## 2021-02-01 LAB — BASIC METABOLIC PANEL
Anion gap: 5 (ref 5–15)
BUN: 11 mg/dL (ref 6–20)
CO2: 28 mmol/L (ref 22–32)
Calcium: 8.4 mg/dL — ABNORMAL LOW (ref 8.9–10.3)
Chloride: 103 mmol/L (ref 98–111)
Creatinine, Ser: 0.98 mg/dL (ref 0.61–1.24)
GFR, Estimated: >60 mL/min (ref >60)
Glucose, Bld: 144 mg/dL — ABNORMAL HIGH (ref 70–99)
Potassium: 4.3 mmol/L (ref 3.5–5.1)
Sodium: 136 mmol/L (ref 135–145)

## 2021-02-01 LAB — MAGNESIUM: Magnesium: 2.1 mg/dL (ref 1.7–2.4)

## 2021-02-01 MED ORDER — APIXABAN 5 MG PO TABS: 5.0000 mg | ORAL_TABLET | Freq: Two times a day (BID) | ORAL | 1 refills | Status: DC

## 2021-02-01 MED ORDER — SACUBITRIL-VALSARTAN 24-26 MG PO TABS: 1.0000 | ORAL_TABLET | Freq: Two times a day (BID) | ORAL | 1 refills | Status: DC

## 2021-02-01 MED ORDER — DOFETILIDE 500 MCG PO CAPS: 500.0000 ug | ORAL_CAPSULE | Freq: Two times a day (BID) | ORAL | 1 refills | Status: DC

## 2021-02-01 MED ORDER — METOPROLOL TARTRATE 50 MG PO TABS: 50.0000 mg | ORAL_TABLET | Freq: Two times a day (BID) | ORAL | 1 refills | Status: DC

## 2021-02-01 NOTE — Progress Notes (Signed)
EP Progress Note  Patient Name: Joe Morrison Date of Encounter: 02/01/2021  [facility] Cardiologist: Meriam Sprague, MD   Subjective   NAEO. Doing well this AM.  Inpatient Medications    Scheduled Meds: . apixaban  5 mg Oral BID  . dofetilide  500 mcg Oral BID  . metoprolol tartrate  25 mg Oral Q6H  . polyethylene glycol  17 g Oral Daily  . sacubitril-valsartan  1 tablet Oral BID  . sodium chloride flush  3 mL Intravenous Q12H   Continuous Infusions: . sodium chloride     PRN Meds: sodium chloride, acetaminophen, nitroGLYCERIN, sodium chloride flush   Vital Signs    Vitals:   01/31/21 2009 02/01/21 0046 02/01/21 0436 02/01/21 0646  BP: 122/71 107/81 124/68   Pulse: 83 70 75 72  Resp: 15 16 18    Temp: 98.1 F (36.7 C) 98.1 F (36.7 C) 97.7 F (36.5 C)   TempSrc: Oral Oral Oral   SpO2:  97% 97%   Weight:   (!) 159.4 kg   Height:       No intake or output data in the 24 hours ending 02/01/21 0747 Last 3 Weights 02/01/2021 01/31/2021 01/27/2021  Weight (lbs) 351 lb 6.6 oz 347 lb 1.6 oz 335 lb  Weight (kg) 159.4 kg 157.444 kg 151.955 kg      Telemetry    No sustained arrhythmias. Sinus rhythm. Rare PVCs - Personally Reviewed  ECG    Yesterday ECG reviewed and QTc acceptable. - Personally Reviewed  Physical Exam   GEN: No acute distress.   Neck: No JVD Cardiac: RRR, no murmurs, rubs, or gallops.  Respiratory: Clear to auscultation bilaterally. GI: Soft, nontender, non-distended  MS: No edema; No deformity. Neuro:  Nonfocal  Psych: Normal affect   Labs    High Sensitivity Troponin:   Recent Labs  Lab 01/27/21 1355 01/27/21 1614  TROPONINIHS 47* 47*      Chemistry Recent Labs  Lab 01/27/21 1355 01/28/21 0039 01/30/21 0156 01/31/21 0009 02/01/21 0156  NA 136   < > 137 138 136  K 3.8   < > 4.3 4.2 4.3  CL 103   < > 106 106 103  CO2 21*   < > 22 23 28   GLUCOSE 188*   < > 103* 109* 144*  BUN 15   < > 20 28* 11  CREATININE  1.06   < > 1.17 1.41* 0.98  CALCIUM 10.0   < > 9.2 9.5 8.4*  PROT 7.7  --   --   --   --   ALBUMIN 4.2  --   --   --   --   AST 25  --   --   --   --   ALT 34  --   --   --   --   ALKPHOS 48  --   --   --   --   BILITOT 0.5  --   --   --   --   GFRNONAA >60   < > >60 58* >60  ANIONGAP 12   < > 9 9 5    < > = values in this interval not displayed.     Hematology Recent Labs  Lab 01/27/21 1355 01/28/21 0039 01/30/21 0156  WBC 9.4 7.5 7.4  RBC 4.92 4.43 4.54  HGB 15.2 13.8 13.7  HCT 44.8 41.4 42.7  MCV 91.1 93.5 94.1  MCH 30.9 31.2 30.2  MCHC 33.9  33.3 32.1  RDW 15.1 15.3 14.9  PLT 250 199 185    BNP Recent Labs  Lab 01/27/21 1355  BNP 475.5*     DDimer No results for input(s): DDIMER in the last 168 hours.   Radiology    No results found.  Cardiac Studies   No new    Assessment & Plan    [phone] man admitted with persistent atrial fibrillation who is currently being loaded with dofetilide.  #persistent AF Cont dofetilide, dose #6 scheduled for this AM On eliquis for stroke ppx Tentatively planning for discharge later today after doses #6  #monomorphic VT Likely related to HF hx.  Keep K>4, Mg>2  #acutely decompensated chronic systolic HF - much improved   For questions or updates, please contact [facility] Please consult [email or web address] for contact info under        Signed, Lanier Prude, MD  02/01/2021, 7:47 AM

## 2021-02-01 NOTE — Discharge Summary (Signed)
Discharge Summary    Patient ID: Joe Morrison MRN: [id number]; DOB: 1966/01/29  Admit date: 01/27/2021 Discharge date: 02/01/2021  PCP:  Agapito Games, MD   [facility]  Cardiologist:  Meriam Sprague, MD  Advanced Practice Provider:  No care team member to display Electrophysiologist:  Lewayne Bunting, MD   Discharge Diagnoses    Principal Problem:   Atrial fibrillation with RVR [facility]) Active Problems:   Morbid obesity (HCC)   HYPERTENSION, BENIGN SYSTEMIC   Aortic regurgitation   Acute combined systolic and diastolic heart failure (HCC)   Monomorphic ventricular tachycardia (HCC)    Diagnostic Studies/Procedures    Echo: 01/28/21  IMPRESSIONS    1. Left ventricular ejection fraction, by estimation, is 35-40%. The left  ventricle has moderately decreased function. Left ventricular endocardial  border not optimally defined to evaluate regional wall motion, even with  Definity contrast. There is mild  concentric left ventricular hypertrophy. Left ventricular diastolic  function could not be evaluated.  2. Right ventricular systolic function is normal. The right ventricular  size is normal. There is mildly elevated pulmonary artery systolic  pressure.  3. Left atrial size was severely dilated.  4. Right atrial size was moderately dilated.  5. The mitral valve is normal in structure. No evidence of mitral valve  regurgitation. No evidence of mitral stenosis.  6. The aortic valve is normal in structure. Aortic valve regurgitation is  not visualized. No aortic stenosis is present.  7. There is mild dilatation of the aortic root, measuring 46 mm. There is  mild dilatation of the ascending aorta, measuring 41 mm.  8. The inferior vena cava is dilated in size with <50% respiratory  variability, suggesting right atrial pressure of 15 mmHg.  _____________   History of Present Illness     Joe Morrison is a 55 y.o. male with  PMH of HTN, obesity, and prior alcoholism who presented to [facility] with intermittent shortness of breath which worsened with exertion.  Patient reported that he has chronic shortness of breath due to morbid obesity.  However, 2 weeks ago he had more than usual shortness of breath for 2 days.  He was orthopneic at that time.  Symptoms resolved however reoccurred 4 days prior to admission and persisted. This time was progressively getting worse and came to ER for further evaluation.  Denied exertional chest tightness or palpitation. Reported lower extremity edema and fatigue.  He was found in A. fib RVR at heart rate of 170 bpm.  He was placed on BiPAP for 2 hours and then transition to nasal cannula.  He was given IV Cardizem 20 mg x 1, IV Lopressor 5 mg x 1 and IV Lasix 40 mg x 1.  He was started on diltiazem drip and transferred to [facility] for admission under cardiology service.  BNP 475.5 HS-troponin 47>>47 Scr 1.06 UDS clear  CXR with Cardiac enlargement with mild vascular congestion and interstitial edema.  Denied illicit drug use, tobacco smoking or alcohol use.  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 No palpitation, dizziness, PND, syncope, melena or blood in his stool or urine  Hospital Course     Consultants: EP (Dr. Ladona Ridgel)  1. Newly diagnosed A fib with RVR: presented to [facility] for worsening of progressive SOB x2 weeks,intermittentorthopnea, lower extremity edema, and fatigue. He required BIPAP support at ED and started on Cardizem gtt before transfer to [facility] for further management. BNP  475. Hs Trop flat 47 >47. CXR with cardiac enlargement with mild vascular congestion and interstitial edema.EKG showed A Fib RVR 170s. Echo showed EF 35-40%, unable assess RWMA and diastolic function, RV normal, LA severely dilated, RA moderately dilated, mild dilatation of the aortic root, measuring 46 mm,mild dilatation of the ascending aorta,  measuring 41 mm. -- s/p failed coversion from TEE with DCCV x4 this admission  -- EP consulted, loading Tikosyn BID, converted to SR  -- TSH WNL; electrolyte goal K >4 and Mag >2 , at goal   --CHA2DS2VAScscore is2due to HTN, CHF; recommend anticoagulation, started on Eliquis  BID this admission   2. Newly diagnosed acute combined heart failure Pulmonary edema POA -- Echo 4/18 showed EF 35-40%, unable assess RWMA and diastolic function, RV normal, LA severely dilated, RA moderately dilated, mild dilatation of the aortic root, measuring 46 mm,mild dilatation of the ascending aorta, measuring 41 mm. -- TEE with EF 30%,LVglobal hypokinesis,mild MR, severe AR, no LAA thrombus, moderateTR -- HIV negative; TSH WNL; A1C 4.7%; LDL 92 from this admission -- etiology suspected to be tachycardiac induced cardiomyopathy +/- undiagnosed OSA  -- started Entresto24/26 BIDfor GDMT, considereoutpatient add/titration of aldactone -- will need repeat Echo outpatient to re-assess EF in several months, may need ischemic workup if remains depressed  3. Monomorphic VT -- noted on telemetry, felt to be related to CHF. Resolved prior to discharge -- continue BBlocker   4. Severe Aortic regurgitation -- not seen Echo but noted on TEE, Echo did revealmild dilatation of the aortic root, measuring 46 mm,mild dilatation of the ascending aorta, measuring 41 mm. -- follow up outpatient with repeat Echo  5. Elevated troponin -- Trop flat 47-47, suspect demand ischemia , no chest pain  6. HTN --PTA meds weremetoprolol, Olmesartan, amlodipine, HCTZ at home  -- consolidate metoprolol to BID and continue Entresto 24/26 BID   7. Morbid obesity  -- continue lifestyle modification  8. Snoring  -- outpatient sleep study recommend for OSA evaluation  Did the patient have an acute coronary syndrome (MI, NSTEMI, STEMI, etc) this admission?:  No.   The elevated Troponin was due to  the acute medical illness (demand ischemia).   _____________  Discharge Vitals Blood pressure 120/72, pulse 69, temperature 98 F (36.7 C), temperature source Oral, resp. rate 20, height  (1.753 m), weight (!) 159.4 kg, SpO2 97 %.  Filed Weights   01/27/21 1358 01/31/21 0335 02/01/21 0436  Weight: (!) 152 kg (!) 157.4 kg (!) 159.4 kg    Labs & Radiologic Studies    CBC Recent Labs    01/30/21 0156  WBC 7.4  HGB 13.7  HCT 42.7  MCV 94.1  PLT 185   Basic Metabolic Panel Recent Labs    16/10/96 0009 02/01/21 0156  NA 138 136  K 4.2 4.3  CL 106 103  CO2 23 28  GLUCOSE 109* 144*  BUN 28* 11  CREATININE 1.41* 0.98  CALCIUM 9.5 8.4*  MG 2.2 2.1   Liver Function Tests No results for input(s): AST, ALT, ALKPHOS, BILITOT, PROT, ALBUMIN in the last 72 hours. No results for input(s): LIPASE, AMYLASE in the last 72 hours. High Sensitivity Troponin:   Recent Labs  Lab 01/27/21 1355 01/27/21 1614  TROPONINIHS 47* 47*    BNP Invalid input(s): POCBNP D-Dimer No results for input(s): DDIMER in the last 72 hours. Hemoglobin A1C No results for input(s): HGBA1C in the last 72 hours. Fasting Lipid Panel No results for  input(s): CHOL, HDL, LDLCALC, TRIG, CHOLHDL, LDLDIRECT in the last 72 hours. Thyroid Function Tests No results for input(s): TSH, T4TOTAL, T3FREE, THYROIDAB in the last 72 hours.  Invalid input(s): FREET3 _____________  DG Chest Portable 1 View  Result Date: 01/27/2021 CLINICAL DATA:  Short of breath EXAM: PORTABLE CHEST 1 VIEW COMPARISON:  None. FINDINGS: Cardiac enlargement with vascular congestion. Probable mild interstitial edema. No significant effusion. Mild bibasilar atelectasis. IMPRESSION: Cardiac enlargement with mild vascular congestion and interstitial edema. Electronically Signed   By: Marlan Palau M.D.   On: 01/27/2021 14:50   ECHOCARDIOGRAM COMPLETE  Result Date: 01/28/2021    ECHOCARDIOGRAM REPORT   Patient Name:   Joe Morrison  Date of Exam: 01/28/2021 Medical Rec #:  [id number]        Height:       69.0 in Accession #:    [id number]       Weight:       335.0 lb Date of Birth:  1966-02-05        BSA:          2.572 m Patient Age:    54 years         BP:           98/62 mmHg Patient Gender: M                HR:           104 bpm. Exam Location:  Inpatient Procedure: 2D Echo, Cardiac Doppler, Color Doppler and Intracardiac            Opacification Agent Indications:    Atrrial fibrillation  History:        Patient has no prior history of Echocardiogram examinations.                 Risk Factors:Hypertension.  Sonographer:    Neomia Dear RDCS Referring Phys: [id number] [facility]  Sonographer Comments: Technically difficult study due to poor echo windows, suboptimal apical window and patient is morbidly obese. No cardiac surgery noted on chart IMPRESSIONS  1. Left ventricular ejection fraction, by estimation, is 35-40%. The left ventricle has moderately decreased function. Left ventricular endocardial border not optimally defined to evaluate regional wall motion, even with Definity contrast. There is mild  concentric left ventricular hypertrophy. Left ventricular diastolic function could not be evaluated.  2. Right ventricular systolic function is normal. The right ventricular size is normal. There is mildly elevated pulmonary artery systolic pressure.  3. Left atrial size was severely dilated.  4. Right atrial size was moderately dilated.  5. The mitral valve is normal in structure. No evidence of mitral valve regurgitation. No evidence of mitral stenosis.  6. The aortic valve is normal in structure. Aortic valve regurgitation is not visualized. No aortic stenosis is present.  7. There is mild dilatation of the aortic root, measuring 46 mm. There is mild dilatation of the ascending aorta, measuring 41 mm.  8. The inferior vena cava is dilated in size with <50% respiratory variability, suggesting right atrial pressure of 15 mmHg. FINDINGS   Left Ventricle: Left ventricular ejection fraction, by estimation, is 35 to 40%. The left ventricle has moderately decreased function. Left ventricular endocardial border not optimally defined to evaluate regional wall motion. Definity contrast agent was given IV to delineate the left ventricular endocardial borders. The left ventricular internal cavity size was normal in size. There is mild concentric left ventricular hypertrophy. Left ventricular diastolic function could not be evaluated due  to atrial fibrillation. Left ventricular diastolic function could not be evaluated. Right Ventricle: The right ventricular size is normal. No increase in right ventricular wall thickness. Right ventricular systolic function is normal. There is mildly elevated pulmonary artery systolic pressure. The tricuspid regurgitant velocity is 2.85  m/s, and with an assumed right atrial pressure of 3 mmHg, the estimated right ventricular systolic pressure is 35.5 mmHg. Left Atrium: Left atrial size was severely dilated. Right Atrium: Right atrial size was moderately dilated. Pericardium: There is no evidence of pericardial effusion. Mitral Valve: The mitral valve is normal in structure. No evidence of mitral valve regurgitation. No evidence of mitral valve stenosis. Tricuspid Valve: The tricuspid valve is normal in structure. Tricuspid valve regurgitation is trivial. No evidence of tricuspid stenosis. Aortic Valve: The aortic valve is normal in structure. Aortic valve regurgitation is not visualized. No aortic stenosis is present. Aortic valve mean gradient measures 6.0 mmHg. Aortic valve peak gradient measures 11.0 mmHg. Aortic valve area, by VTI measures 4.95 cm. Pulmonic Valve: The pulmonic valve was not well visualized. Pulmonic valve regurgitation is not visualized. No evidence of pulmonic stenosis. Aorta: The aortic root is normal in size and structure. There is mild dilatation of the aortic root, measuring 46 mm. There is mild  dilatation of the ascending aorta, measuring 41 mm. Venous: The inferior vena cava is dilated in size with less than 50% respiratory variability, suggesting right atrial pressure of 15 mmHg. IAS/Shunts: No atrial level shunt detected by color flow Doppler.  LEFT VENTRICLE PLAX 2D LVIDd:         5.30 cm LVIDs:         4.80 cm LV PW:         1.40 cm LV IVS:        1.40 cm LVOT diam:     3.20 cm LV SV:         138 LV SV Index:   53 LVOT Area:     8.04 cm  RIGHT VENTRICLE TAPSE (M-mode): 1.3 cm LEFT ATRIUM              Index       RIGHT ATRIUM           Index LA diam:        5.10 cm  1.98 cm/m  RA Area:     25.80 cm LA Vol (A2C):   184.0 ml 71.53 ml/m RA Volume:   80.60 ml  31.33 ml/m LA Vol (A4C):   124.0 ml 48.20 ml/m LA Biplane Vol: 154.0 ml 59.87 ml/m  AORTIC VALVE                    PULMONIC VALVE AV Area (Vmax):    4.78 cm     PV Vmax:       0.56 m/s AV Area (Vmean):   4.98 cm     PV Vmean:      42.300 cm/s AV Area (VTI):     4.95 cm     PV VTI:        0.113 m AV Vmax:           166.00 cm/s  PV Peak grad:  1.3 mmHg AV Vmean:          119.000 cm/s PV Mean grad:  1.0 mmHg AV VTI:            0.278 m AV Peak Grad:      11.0 mmHg AV Mean Grad:  6.0 mmHg LVOT Vmax:         98.70 cm/s LVOT Vmean:        73.700 cm/s LVOT VTI:          0.171 m LVOT/AV VTI ratio: 0.62  AORTA Ao Root diam: 4.60 cm Ao Asc diam:  4.10 cm MITRAL VALVE               TRICUSPID VALVE MV Area (PHT): 6.51 cm    TR Peak grad:   32.5 mmHg MV Decel Time: 117 msec    TR Vmax:        285.00 cm/s MV E velocity: 96.40 cm/s                            SHUNTS                            Systemic VTI:  0.17 m                            Systemic Diam: 3.20 cm Thurmon Fair MD Electronically signed by Thurmon Fair MD Signature Date/Time: 01/28/2021/11:30:31 AM    Final    ECHO TEE  Result Date: 01/29/2021    TRANSESOPHOGEAL ECHO REPORT   Patient Name:   TRASE BUNDA Date of Exam: 01/29/2021 Medical Rec #:  [id number]        Height:  Accession #:    [id number]       Weight: Date of Birth:  May 23, 1966        BSA: Patient Age:    54 years         BP:           147/77 mmHg Patient Gender: M                HR:           105 bpm. Exam Location:  Inpatient Procedure: Transesophageal Echo, 3D Echo and Color Doppler Indications:     I48.91* Unspeicified atrial fibrillation  History:         Patient has prior history of Echocardiogram examinations, most                  recent 01/28/2021. CHF, Arrythmias:Atrial Fibrillation; Risk                  Factors:Hypertension and ETOH.  Sonographer:     Eulah Pont RDCS Referring Phys:  [id number] Manson Passey Diagnosing Phys: Charlton Haws MD PROCEDURE: After discussion of the risks and benefits of a TEE, an informed consent was obtained from the patient. The transesophogeal probe was passed without difficulty through the esophogus of the patient. Local oropharyngeal anesthetic was provided with Cetacaine. Sedation performed by different physician. The patient was monitored while under deep sedation. Anesthestetic sedation was provided intravenously by Anesthesiology: 117.6mg  of Propofol, 60mg  of Lidocaine. The patient's vital signs; including heart rate, blood pressure, and oxygen saturation; remained stable throughout the procedure. The patient developed no complications during the procedure. An unsuccessful direct current cardioversion was performed at 200 joules with 4. IMPRESSIONS  1. DCC attempted x 4 with 200J biphasic and AP manual compression Patient failed to convert to sinus.  2. Left ventricular ejection fraction, by estimation, is 30 to 35%. The left ventricle has moderately decreased function. The left ventricle demonstrates global hypokinesis. The left ventricular internal  cavity size was severely dilated.  3. Right ventricular systolic function is normal. The right ventricular size is normal.  4. Extensive 3D imaging of LAA performed. Left atrial size was severely dilated. No left atrial/left  atrial appendage thrombus was detected.  5. Right atrial size was mildly dilated.  6. 3 D imaging MV performed . The mitral valve is normal in structure. Mild mitral valve regurgitation.  7. Tricuspid valve regurgitation is moderate.  8. The aortic valve is tricuspid. Aortic valve regurgitation is severe. FINDINGS  Left Ventricle: Left ventricular ejection fraction, by estimation, is 30 to 35%. The left ventricle has moderately decreased function. The left ventricle demonstrates global hypokinesis. The left ventricular internal cavity size was severely dilated. There is no left ventricular hypertrophy. Right Ventricle: The right ventricular size is normal. Right vetricular wall thickness was not assessed. Right ventricular systolic function is normal. Left Atrium: Extensive 3D imaging of LAA performed. Left atrial size was severely dilated. No left atrial/left atrial appendage thrombus was detected. Right Atrium: Right atrial size was mildly dilated. Pericardium: There is no evidence of pericardial effusion. Mitral Valve: 3 D imaging MV performed. The mitral valve is normal in structure. Mild mitral valve regurgitation. Tricuspid Valve: The tricuspid valve is normal in structure. Tricuspid valve regurgitation is moderate. Aortic Valve: The aortic valve is tricuspid. Aortic valve regurgitation is severe. Pulmonic Valve: The pulmonic valve was normal in structure. Pulmonic valve regurgitation is mild. Aorta: The aortic root is normal in size and structure. IAS/Shunts: No atrial level shunt detected by color flow Doppler. Additional Comments: DCC attempted x 4 with 200J biphasic and AP manual compression Patient failed to convert to sinus. Charlton Haws MD Electronically signed by Charlton Haws MD Signature Date/Time: 01/29/2021/11:32:17 AM    Final    Disposition   Pt is being discharged home today in good condition.  Follow-up Plans & Appointments     Follow-up Information    [facility]. Go to.   Specialty: Cardiology Why: 4/29 @ 2pm. [facility]. FREE valet parking at [employer], off [address]. Bring all your medications and pill box with you to your ~1 hour appt.  Contact information: [address] [id number] mc [place] [address] [phone]       [facility] Follow up.   Specialty: Cardiology Why: on 4/29 at 0930 for post hospital tikosyn follow up Contact information: [address] [id number] mc [place] [address] [phone]             Discharge Instructions    (HEART FAILURE PATIENTS) Call MD:  Anytime you have any of the following symptoms: 1) 3 pound weight gain in 24 hours or 5 pounds in 1 week 2) shortness of breath, with or without a dry hacking cough 3) swelling in the hands, feet or stomach 4) if you have to sleep on extra pillows at night in order to breathe.   Complete by: As directed    Call MD for:  difficulty breathing, headache or visual disturbances   Complete by: As directed    Call MD for:  persistant dizziness or light-headedness   Complete by: As directed    Diet - low sodium heart healthy   Complete by: As directed    Increase activity slowly   Complete by: As directed       Discharge Medications   Allergies as of 02/01/2021  No Known Allergies     Medication List    STOP taking these medications   metoprolol succinate 50 MG 24 hr tablet Commonly known as: TOPROL-XL   Olmesartan-amLODIPine-HCTZ 40-5-12.5 MG Tabs     TAKE these medications   AIRBORNE PO Take 1 packet by mouth daily.   apixaban 5 MG Tabs tablet Commonly known as: ELIQUIS Take 1 tablet (5 mg total) by mouth 2 (two) times daily.   dofetilide 500 MCG capsule Commonly known as: TIKOSYN Take 1 capsule (500 mcg total) by mouth 2 (two) times daily.   metoprolol tartrate 50 MG tablet Commonly known as:  LOPRESSOR Take 1 tablet (50 mg total) by mouth 2 (two) times daily.   multivitamin with minerals Tabs tablet Take 1 tablet by mouth daily.   sacubitril-valsartan 24-26 MG Commonly known as: ENTRESTO Take 1 tablet by mouth 2 (two) times daily.        Outstanding Labs/Studies   BMET at follow up appt  Duration of Discharge Encounter   Greater than 30 minutes including physician time.  Signed, Laverda PageLindsay Jacobus Colvin, NP 02/01/2021, 11:20 AM

## 2021-02-01 NOTE — Progress Notes (Signed)
Discharged to home with family office visits in place teaching done  

## 2021-02-02 ENCOUNTER — Telehealth: Payer: Self-pay | Admitting: Student

## 2021-02-02 NOTE — Telephone Encounter (Signed)
   Patient called after hours Answering Service. He was discharged yesterday after Tikosyn load. When he went to pick up his Tikosyn yesterday, the Pharmacy was closing. He called the Pharmacy this morning and was reportedly told it had to be reviewed. I talked with Dr. Lalla Brothers given patient missed dose last night and has not taken this morning dose yet. He said if he can go and get the prescription now and take the dose as soon as possible that will be OK and he will not have to be readmitted. Called Pharmacy and let them know that patient will be heading that way and they said they would fill the presciption. Called and updated patient and informed him that he needs to head to the Pharmacy now and take the first dose as soon as he gets it. He voiced understand and agreed.  Corrin Parker, PA-C 02/02/2021 10:35 AM

## 2021-02-03 ENCOUNTER — Telehealth: Payer: Self-pay | Admitting: General Practice

## 2021-02-03 NOTE — Telephone Encounter (Signed)
Transition Care Management Unsuccessful Follow-up Telephone Call  Date of discharge and from where:  02/01/21 [facility]  Attempts:  1st Attempt  Reason for unsuccessful TCM follow-up call:  Left voice message

## 2021-02-04 ENCOUNTER — Telehealth: Payer: Self-pay | Admitting: *Deleted

## 2021-02-04 NOTE — Telephone Encounter (Signed)
Pt reports that he has had some issues with sleep since he was in the hospital. He spoke with the cardiologist about this but was told to reach out to his pcp. He would like to have a formal sleep study done at some point.  He has a f/u with pcp on 03/05/2021 Asked that any medication be sent to   [id number] - [place], [place] - [address] AT [address]

## 2021-02-04 NOTE — Telephone Encounter (Signed)
Please schedule virtual visit with me for sleep so we can get going with workup if needed.

## 2021-02-05 ENCOUNTER — Encounter: Payer: Self-pay | Admitting: Family Medicine

## 2021-02-05 ENCOUNTER — Telehealth (INDEPENDENT_AMBULATORY_CARE_PROVIDER_SITE_OTHER): Payer: Commercial Managed Care - PPO | Admitting: Family Medicine

## 2021-02-05 VITALS — BP 128/69 | HR 67 | Ht 69.0 in | Wt 351.0 lb

## 2021-02-05 DIAGNOSIS — R0683 Snoring: Secondary | ICD-10-CM

## 2021-02-05 DIAGNOSIS — F5101 Primary insomnia: Secondary | ICD-10-CM

## 2021-02-05 DIAGNOSIS — G4733 Obstructive sleep apnea (adult) (pediatric): Secondary | ICD-10-CM | POA: Insufficient documentation

## 2021-02-05 MED ORDER — DIPHENHYDRAMINE HCL 50 MG PO TABS: 50.0000 mg | ORAL_TABLET | Freq: Every evening | ORAL | 0 refills | Status: DC | PRN

## 2021-02-05 NOTE — Progress Notes (Signed)
Virtual Visit via Video Note  I connected with Joe Morrison on 02/05/21 at 10:10 AM EDT by a video enabled telemedicine application and verified that I am speaking with the correct person using two identifiers.   I discussed the limitations of evaluation and management by telemedicine and the availability of in person appointments. The patient expressed understanding and agreed to proceed.  Patient location: at home  Provider location: in office  Subjective:    CC: Sleep issues  HPI: Joe Morrison actually called yesterday letting Joe Morrison know that he had recently been admitted to the hospital for afib with RVR.  He has a follow-up scheduled with me at the end of May.  He was evidently having some issues with sleep since coming home from the hospital and had mentioned at his follow-up appoint with a cardiologist. He says since he started the Tikosyn in the hospital he has been more SOB, even at rest which is not normal for him.  He is out of afib now.  He is feeling it daily.  He had started exercising again for about the last 1.5 months.  Feels like this if affecting his life quality.    He has never had a sleep study done. Epworth sleepiness score of 12.  Positive STOP BANG.    Last night actually slept for 3 hours which is the best he has had in a while.   Says will lay down to go to sleep and then notice his SOB and then gets more anxious and can't fall asleep.only getting an hour of sleep. Sometime gets out of bed and sometimes toss and turn all night.     Past medical history, Surgical history, Family history not pertinant except as noted below, Social history, Allergies, and medications have been entered into the medical record, reviewed, and corrections made.   Review of Systems: No fevers, chills, night sweats, weight loss, chest pain, or shortness of breath.   Objective:    General: Speaking clearly in complete sentences without any shortness of breath.  Alert and oriented x3.  Normal  judgment. No apparent acute distress.    Impression and Recommendations:    Primary insomnia Actually having the insomnia is transient he actually had a better night last night and since he been home from the hospital.  Some hoping it is finally moving in the right direction.  The Tikosyn itself is not causing insomnia but it is making him feel more short of breath and then when he is trying to go to sleep he is more aware of the shortness of breath and then that is actually really ramping up his anxiety.  Certainly for the long haul health of the have to speak with the cardiologist about whether or not Tikosyn is going to be the best option for him long-term as it is causing significant symptoms.  In the meantime we discussed sleep hygiene and also reviewed some over-the-counter medications that are safe with his current medication regimen including a trial of melatonin starting at 3 mg and working up gradually or even considering Benadryl.  25 to 50 mg if needed to see if that is helpful.  If those are not helping then we could consider a prescription sleep aid.  But again I want a work-up for sleep apnea before using a stronger sedative so that we do not cause any significant respiratory depression.  Snoring Causative screen on his STOP-BANG and significant score for his Epworth sleepiness scale.  We discussed options including  referral for formal sleep study in the sleep lab because of his cardiovascular complications.  Patient is agreeable we will go ahead and place referral.  We will likely need to get authorized with the insurance.  We will call him once we have results available.      Time spent in encounter 30 minutes  I discussed the assessment and treatment plan with the patient. The patient was provided an opportunity to ask questions and all were answered. The patient agreed with the plan and demonstrated an understanding of the instructions.   The patient was advised to call back or  seek an in-person evaluation if the symptoms worsen or if the condition fails to improve as anticipated.   Nani Gasser, MD

## 2021-02-05 NOTE — Telephone Encounter (Signed)
Patient scheduled. He states he was able to sleep 3-4 hours last night.

## 2021-02-05 NOTE — Assessment & Plan Note (Signed)
Actually having the insomnia is transient he actually had a better night last night and since he been home from the hospital.  Some hoping it is finally moving in the right direction.  The Tikosyn itself is not causing insomnia but it is making him feel more short of breath and then when he is trying to go to sleep he is more aware of the shortness of breath and then that is actually really ramping up his anxiety.  Certainly for the long haul health of the have to speak with the cardiologist about whether or not Tikosyn is going to be the best option for him long-term as it is causing significant symptoms.  In the meantime we discussed sleep hygiene and also reviewed some over-the-counter medications that are safe with his current medication regimen including a trial of melatonin starting at 3 mg and working up gradually or even considering Benadryl.  25 to 50 mg if needed to see if that is helpful.  If those are not helping then we could consider a prescription sleep aid.  But again I want a work-up for sleep apnea before using a stronger sedative so that we do not cause any significant respiratory depression.

## 2021-02-05 NOTE — Assessment & Plan Note (Signed)
Causative screen on his STOP-BANG and significant score for his Epworth sleepiness scale.  We discussed options including referral for formal sleep study in the sleep lab because of his cardiovascular complications.  Patient is agreeable we will go ahead and place referral.  We will likely need to get authorized with the insurance.  We will call him once we have results available.

## 2021-02-05 NOTE — Progress Notes (Addendum)
[facility]  PCP: Nani Gasser Primary Cardiologist: Laurance Flatten  HPI:  Joe Morrison is a 55 y.o.  male  with a PMH significant for hypertension, obesity, alcohol use disorder in remission, recently diagnosed Afib, HFrEF.   Patient presented to [facility] with intermittent shortness of breath which worsened with exertion.  Apparently this had been worsening over the last few weeks before his presentation.  He was found in A. fib RVR at heart rate of 170 bpm.  He was placed on BiPAP for 2 hours and then transition to nasal cannula.  He was given IV Cardizem 20 mg x 1, IV Lopressor 5 mg x 1 and IV Lasix 40 mg x 1.  He was started on diltiazem drip and transferred to [facility] for admission under cardiology service. He was found to have elevated BNP 475.5, HS-troponin 47>>47, cr 1.06, UDS negative,  CXR with Cardiac enlargement with mild vascular congestion and interstitial edema.  During his workup at [facility] he had a TTE on 4/19 with  EF 35-40%, Severe LAE, Mod RAE, normal RV. This was followed by a TEE  with EF 30-35%, Severe LAE, mild MR, Mod TR, Severe AR.  He was started on eliquis and underwent TEE/DCCV however failed to cardiovert despite shock x 4. No apparent NSR. EP team was asked to see for consideration of inpatient tikosyn loading.  Pt converted to sinus rhythm on Tikosyn 500 mcg BID. Stable QTc at ~475 ms.  He was continued on metoprolol tartrate 50mg  BID and Tikosyn BID.  He maintained NSR and was discharged with outpatient follow up. Difficult to say how much diuresis was achieved based on weights and I's and O's seems that most of his symptoms were related to afib but he did receive one dose of IV lasix at 40mg  and transitioned to oral 40mg  for a day as well. was started and lasix d/ced.    Was drinking 22 beers per day until 2001.  Has not drank any since.  Quit smoking in 2000.     Initially felt worse with Tikosyn initiation more short of breath however has felt much better the last few days, sleeping better.  Shortness of breath is better when he is resting.  He is very deconditioned still and gets short of breath when walking a short distance for example from the afib clinic to the [facility] clinic.  Denies chest pain, PND.  Weighing himself every morning.  By home scales weight is around 347.     ROS: All systems negative except as listed in HPI, PMH and Problem List.  SH:  Social History   Socioeconomic History  . Marital status: Single    Spouse name: Not on file  . Number of children: 0  . Years of education: Not on file  . Highest education level: Some college, no degree  Occupational History  . Occupation: 2002  Tobacco Use  . Smoking status: Former Smoker    Packs/day: 2.00    Years: 16.00    Pack years: 32.00    Types: Cigarettes    Quit date: 04/27/1999    Years since quitting: 21.8  . Smokeless tobacco: Never Used  Vaping Use  . Vaping Use: Never used  Substance and Sexual Activity  . Alcohol use: No    Comment: Former [employer] in [employer], drilling. former heavy drinker 20beers/night.  . Drug use: Not Currently    Types:  Marijuana, MDMA (Ecstacy), Oxycodone    Comment: stopped in 2001  . Sexual activity: Not Currently    Partners: Male    Birth control/protection: None  Other Topics Concern  . Not on file  Social History Narrative   Working out daily with a Psychologist, educational. No partner   Social Determinants of Health   Financial Resource Strain: Low Risk   . Difficulty of Paying Living Expenses: Not hard at all  Food Insecurity: No Food Insecurity  . Worried About Programme researcher, broadcasting/film/video in the Last Year: Never true  . Ran Out of Food in the Last Year: Never true  Transportation Needs: No Transportation Needs  . Lack of Transportation (Medical): No  . Lack of Transportation (Non-Medical): No  Physical Activity: Sufficiently Active   . Days of Exercise per Week: 3 days  . Minutes of Exercise per Session: 60 min  Stress: Stress Concern Present  . Feeling of Stress : Rather much  Social Connections: Not on file  Intimate Partner Violence: Not on file    FH:  Family History  Problem Relation Age of Onset  . Diabetes Mother   . Hypertension Mother   . Heart attack Father 38  . Alcohol abuse Other     Past Medical History:  Diagnosis Date  . Alcoholic (HCC)    recovering since 10/1999  . Hypertension     Current Outpatient Medications  Medication Sig Dispense Refill  . apixaban (ELIQUIS) 5 MG TABS tablet Take 1 tablet (5 mg total) by mouth 2 (two) times daily. 60 tablet 1  . dofetilide (TIKOSYN) 500 MCG capsule Take 1 capsule (500 mcg total) by mouth 2 (two) times daily. 60 capsule 1  . metoprolol tartrate (LOPRESSOR) 50 MG tablet Take 1 tablet (50 mg total) by mouth 2 (two) times daily. 60 tablet 1  . Multiple Vitamin (MULTIVITAMIN WITH MINERALS) TABS tablet Take 1 tablet by mouth daily.    . Multiple Vitamins-Minerals (AIRBORNE PO) Take 1 packet by mouth daily.    . sacubitril-valsartan (ENTRESTO) 24-26 MG Take 1 tablet by mouth 2 (two) times daily. 60 tablet 1   No current facility-administered medications for this encounter.    Vitals:   02/07/21 1018  BP: 138/82  Pulse: 80  Weight: (!) 159.2 kg (350 lb 14.4 oz)    PHYSICAL EXAM: Cardiac: JVD difficult to assess, normal rate and rhythm, clear s1 and s2, no murmurs but heart sounds distant, rubs or gallops, 1+ LE edema Pulmonary: CTAB, not in distress Abdominal: non distended abdomen, soft and nontender Psych: Alert, conversant, in good spirits    ECG   NSR rate 80, QT/QTcB 386/445 ms  ASSESSMENT & PLAN: Systolic CHF: -new diagnosis in the setting of afib w/RVR -TTE 01/2021  EF 35-40%, Severe LAE, Mod RAE, normal RV. This was followed by a TEE  with EF 30-35%, Severe LAE, mild MR, Mod TR, Severe AI. -no s/s of ischemia and troponin flat  during admission felt to be due to afib  -NYHA Class III symptoms but severely deconditioned and BMI 51, volume status okay -Continue Entresto 49/51 -switch metoprolol tartrate to toprol XL 100mg  -start spironolactone 12.5mg  daily, repeat BMP in one week and will need repeat in one month will need to watch K closely -PCP has ordered a sleep study -Assess EF on ECHO after medical therapy/maintenance of NSR expect this will improve.    Severe AI: -continue to intensify medical therapy and follow by repeat ECHO  Persistent Afib: -01/2021 s/p  attempt at DCCV which was unsuccesful followed by tikosyn loading and conversion to NSR -CHADS2VASC is 2, on eliquis -saw afib clinic today, remains in NSR, tikosyn continued -switching tartrate to toprol XL 100mg  as above -PCP has ordered a sleep study  Obesity: -PCP has ordered a sleep study -working with a trainer currently for exercise -Doesn't currently want referral to healthy weight and wellness but will think about it   Follow up with general cardiology

## 2021-02-05 NOTE — Telephone Encounter (Signed)
Transition Care Management Follow-up Telephone Call  Date of discharge and from where: 02/01/2021 from [facility]  How have you been since you were released from the hospital? Pt has Virtual visit with PCP today.

## 2021-02-05 NOTE — Progress Notes (Signed)
Pt reports that since being d/cfrom hospital. He gets some SOB right when he starts to fall asleep.  He has never had a sleep study done

## 2021-02-06 NOTE — Progress Notes (Signed)
Primary Care Physician: Agapito Games, MD Primary Cardiologist: Dr Shari Prows Primary Electrophysiologist: Dr Ladona Ridgel Referring Physician: Dr Nelida Gores is a 55 y.o. male with a history of remote ETOH abuse, HTN, chronic combined systolic and diastolic CHF, and atrial fibrillation who presents for follow up in the [facility]. Patient presented to the ED 01/27/21 with acutely worsening SOB, orthopnea, and lower extremity edema. ECG showed rapid atrial fibrillation. He was admitted to be treated for acute CHF and rapid afib. Patient is on Eliquis for a CHADS2VASC score of 2. He underwent unsuccessful TEE/DCCV on 01/29/21 and the decision was made to load on dofetilide. He converted to SR with the medication and did not require repeat DCCV. He reports that he is slowly getting back to his baseline but still feels fatigued with exertion. He is being worked up for OSA by his PCP. He denies any bleeding issues on anticoagulation.   Today, he denies symptoms of palpitations, chest pain, shortness of breath, orthopnea, PND, lower extremity edema, dizziness, presyncope, syncope, snoring, daytime somnolence, bleeding, or neurologic sequela. The patient is tolerating medications without difficulties and is otherwise without complaint today.    Atrial Fibrillation Risk Factors:  he does have symptoms or diagnosis of sleep apnea. Sleep study pending he does not have a history of rheumatic fever. he does have a history of alcohol use. The patient does not have a history of early familial atrial fibrillation or other arrhythmias.  he has a BMI of Body mass index is 51.77 kg/m.[name] Filed Weights   02/07/21 0941  Weight: (!) 159 kg    Family History  Problem Relation Age of Onset  . Diabetes Mother   . Hypertension Mother   . Heart attack Father 74  . Alcohol abuse Other      Atrial Fibrillation Management history:  Previous antiarrhythmic drugs:  dofetilide  Previous cardioversions: 01/29/21 Previous ablations: none CHADS2VASC score: 2 Anticoagulation history: Eliquis   Past Medical History:  Diagnosis Date  . Alcoholic (HCC)    recovering since 10/1999  . Hypertension    Past Surgical History:  Procedure Laterality Date  . CARDIOVERSION N/A 01/29/2021   Procedure: CARDIOVERSION;  Surgeon: Wendall Stade, MD;  Location: [facility] ENDOSCOPY;  Service: Cardiovascular;  Laterality: N/A;  . MOUTH SURGERY     Dental work  . TEE WITHOUT CARDIOVERSION N/A 01/29/2021   Procedure: TRANSESOPHAGEAL ECHOCARDIOGRAM (TEE);  Surgeon: Wendall Stade, MD;  Location: [facility] ENDOSCOPY;  Service: Cardiovascular;  Laterality: N/A;  . TONSILLECTOMY  1972    Current Outpatient Medications  Medication Sig Dispense Refill  . apixaban (ELIQUIS) 5 MG TABS tablet Take 1 tablet (5 mg total) by mouth 2 (two) times daily. 60 tablet 1  . dofetilide (TIKOSYN) 500 MCG capsule Take 1 capsule (500 mcg total) by mouth 2 (two) times daily. 60 capsule 1  . metoprolol tartrate (LOPRESSOR) 50 MG tablet Take 1 tablet (50 mg total) by mouth 2 (two) times daily. 60 tablet 1  . Multiple Vitamin (MULTIVITAMIN WITH MINERALS) TABS tablet Take 1 tablet by mouth daily.    . Multiple Vitamins-Minerals (AIRBORNE PO) Take 1 packet by mouth daily.    . sacubitril-valsartan (ENTRESTO) 24-26 MG Take 1 tablet by mouth 2 (two) times daily. 60 tablet 1   No current facility-administered medications for this encounter.    No Known Allergies  Social History   Socioeconomic History  . Marital status: Single    Spouse name:  Not on file  . Number of children: 0  . Years of education: Not on file  . Highest education level: Some college, no degree  Occupational History  . Occupation: Engineer, drilling  Tobacco Use  . Smoking status: Former Smoker    Packs/day: 2.00    Years: 16.00    Pack years: 32.00    Types: Cigarettes    Quit date: 04/27/1999    Years since quitting: 21.8   . Smokeless tobacco: Never Used  Vaping Use  . Vaping Use: Never used  Substance and Sexual Activity  . Alcohol use: No    Comment: Former [employer] in 12/158. former heavy drinker 20beers/night.  . Drug use: Not Currently    Types: Marijuana, MDMA (Ecstacy), Oxycodone    Comment: stopped in 2001  . Sexual activity: Not Currently    Partners: Male    Birth control/protection: None  Other Topics Concern  . Not on file  Social History Narrative   Working out daily with a Psychologist, educational. No partner   Social Determinants of Health   Financial Resource Strain: Low Risk   . Difficulty of Paying Living Expenses: Not hard at all  Food Insecurity: No Food Insecurity  . Worried About Programme researcher, broadcasting/film/video in the Last Year: Never true  . Ran Out of Food in the Last Year: Never true  Transportation Needs: No Transportation Needs  . Lack of Transportation (Medical): No  . Lack of Transportation (Non-Medical): No  Physical Activity: Sufficiently Active  . Days of Exercise per Week: 3 days  . Minutes of Exercise per Session: 60 min  Stress: Stress Concern Present  . Feeling of Stress : Rather much  Social Connections: Not on file  Intimate Partner Violence: Not on file     ROS- All systems are reviewed and negative except as per the HPI above.  Physical Exam: Vitals:   02/07/21 0941  BP: 138/82  Pulse: 80  Weight: (!) 159 kg  Height: 5\' 9"  (1.753 m)    GEN- The patient is a well appearing obese male, alert and oriented x 3 today.   Head- normocephalic, atraumatic Eyes-  Sclera clear, conjunctiva pink Ears- hearing intact Oropharynx- clear Neck- supple  Lungs- Clear to ausculation bilaterally, normal work of breathing Heart- Regular rate and rhythm, no murmurs, rubs or gallops  GI- soft, NT, ND, + BS Extremities- no clubbing, cyanosis, or edema MS- no significant deformity or atrophy Skin- no rash or lesion Psych- euthymic mood, full affect Neuro- strength and sensation are  intact  Wt Readings from Last 3 Encounters:  02/07/21 (!) 159 kg  02/05/21 (!) 159.2 kg  02/01/21 (!) 159.4 kg    EKG today demonstrates  SR, NST Vent. rate 80 BPM PR interval 188 ms QRS duration 94 ms QT/QTcB 386/445 ms  Echo 01/28/21 demonstrated  1. Left ventricular ejection fraction, by estimation, is 35-40%. The left  ventricle has moderately decreased function. Left ventricular endocardial  border not optimally defined to evaluate regional wall motion, even with  Definity contrast. There is mild  concentric left ventricular hypertrophy. Left ventricular diastolic  function could not be evaluated.  2. Right ventricular systolic function is normal. The right ventricular  size is normal. There is mildly elevated pulmonary artery systolic  pressure.  3. Left atrial size was severely dilated.  4. Right atrial size was moderately dilated.  5. The mitral valve is normal in structure. No evidence of mitral valve  regurgitation. No evidence of mitral  stenosis.  6. The aortic valve is normal in structure. Aortic valve regurgitation is  not visualized. No aortic stenosis is present.  7. There is mild dilatation of the aortic root, measuring 46 mm. There is  mild dilatation of the ascending aorta, measuring 41 mm.  8. The inferior vena cava is dilated in size with <50% respiratory  variability, suggesting right atrial pressure of 15 mmHg.   TEE 01/29/21 1. DCC attempted x 4 with 200J biphasic and AP manual compression Patient  failed to convert to sinus.  2. Left ventricular ejection fraction, by estimation, is 30 to 35%. The  left ventricle has moderately decreased function. The left ventricle  demonstrates global hypokinesis. The left ventricular internal cavity size  was severely dilated.  3. Right ventricular systolic function is normal. The right ventricular  size is normal.  4. Extensive 3D imaging of LAA performed. Left atrial size was severely  dilated. No  left atrial/left atrial appendage thrombus was detected.  5. Right atrial size was mildly dilated.  6. 3 D imaging MV performed . The mitral valve is normal in structure.  Mild mitral valve regurgitation.  7. Tricuspid valve regurgitation is moderate.  8. The aortic valve is tricuspid. Aortic valve regurgitation is severe.   Epic records are reviewed at length today  CHA2DS2-VASc Score = 2  The patient's score is based upon: CHF History: Yes HTN History: Yes Diabetes History: No Stroke History: No Vascular Disease History: No Age Score: 0 Gender Score: 0      ASSESSMENT AND PLAN: 1. Persistent Atrial Fibrillation (ICD10:  I48.19) The patient's CHA2DS2-VASc score is 2, indicating a 2.2% annual risk of stroke.   General education about afib provided and questions answered. We also discussed his stroke risk and the risks and benefits of anticoagulation. Patient appears to be maintaining SR. Continue dofetilide 500 mcg BID. QT stable. Check mag/bmet Continue Lopressor 50 mg BID Continue Eliquis 5 mg BID Patient does express significant anxiety about his new diagnoses. Patient agreeable to referral for Dr Bosie Clos.    2. Secondary Hypercoagulable State (ICD10:  D68.69) The patient is at significant risk for stroke/thromboembolism based upon his CHA2DS2-VASc Score of 2.  Continue Apixaban (Eliquis).   3. Obesity Body mass index is 51.77 kg/m. Lifestyle modification was discussed at length including regular exercise and weight reduction. Patient starting to work with trainer.   4. Chronic combined systolic and diastolic CHF No signs or symptoms of fluid overload.  5. Suspected OSA The importance of adequate treatment of sleep apnea was discussed today in order to improve our ability to maintain sinus rhythm long term. Sleep study pending with PCP per patient.   6. Severe AR Noted on TEE.   7. HTN Stable, no changes today.   Follow up with Otilio Saber as scheduled.     Jorja Loa PA-C [facility] [address] [place], [place] [address] [phone] 02/07/2021 9:51 AM

## 2021-02-07 ENCOUNTER — Ambulatory Visit (HOSPITAL_BASED_OUTPATIENT_CLINIC_OR_DEPARTMENT_OTHER)
Admit: 2021-02-07 | Discharge: 2021-02-07 | Disposition: A | Discharge status: Home / Self Care | Payer: Commercial Managed Care - PPO | Attending: Physician Assistant | Admitting: Physician Assistant

## 2021-02-07 ENCOUNTER — Ambulatory Visit (HOSPITAL_COMMUNITY)
Admission: RE | Admit: 2021-02-07 | Discharge: 2021-02-07 | Disposition: A | Discharge status: Home / Self Care | Payer: Commercial Managed Care - PPO | Source: Ambulatory Visit | Attending: Physician Assistant | Admitting: Physician Assistant

## 2021-02-07 ENCOUNTER — Other Ambulatory Visit (HOSPITAL_BASED_OUTPATIENT_CLINIC_OR_DEPARTMENT_OTHER): Payer: Self-pay

## 2021-02-07 ENCOUNTER — Other Ambulatory Visit: Payer: Self-pay

## 2021-02-07 ENCOUNTER — Telehealth (HOSPITAL_COMMUNITY): Payer: Self-pay

## 2021-02-07 ENCOUNTER — Other Ambulatory Visit (HOSPITAL_COMMUNITY): Payer: Self-pay

## 2021-02-07 ENCOUNTER — Encounter (HOSPITAL_COMMUNITY): Payer: Self-pay | Admitting: Physician Assistant

## 2021-02-07 ENCOUNTER — Encounter (HOSPITAL_COMMUNITY): Payer: Self-pay

## 2021-02-07 VITALS — BP 138/82 | HR 80 | Wt 350.9 lb

## 2021-02-07 VITALS — BP 138/82 | HR 80 | Ht 69.0 in | Wt 350.6 lb

## 2021-02-07 DIAGNOSIS — Z7901 Long term (current) use of anticoagulants: Secondary | ICD-10-CM | POA: Insufficient documentation

## 2021-02-07 DIAGNOSIS — D6869 Other thrombophilia: Secondary | ICD-10-CM | POA: Insufficient documentation

## 2021-02-07 DIAGNOSIS — Z8249 Family history of ischemic heart disease and other diseases of the circulatory system: Secondary | ICD-10-CM | POA: Insufficient documentation

## 2021-02-07 DIAGNOSIS — Z6841 Body Mass Index (BMI) 40.0 and over, adult: Secondary | ICD-10-CM | POA: Insufficient documentation

## 2021-02-07 DIAGNOSIS — Z87891 Personal history of nicotine dependence: Secondary | ICD-10-CM | POA: Insufficient documentation

## 2021-02-07 DIAGNOSIS — Z79899 Other long term (current) drug therapy: Secondary | ICD-10-CM | POA: Insufficient documentation

## 2021-02-07 DIAGNOSIS — I5042 Chronic combined systolic (congestive) and diastolic (congestive) heart failure: Secondary | ICD-10-CM | POA: Diagnosis not present

## 2021-02-07 DIAGNOSIS — I4819 Other persistent atrial fibrillation: Secondary | ICD-10-CM

## 2021-02-07 DIAGNOSIS — I351 Nonrheumatic aortic (valve) insufficiency: Secondary | ICD-10-CM

## 2021-02-07 DIAGNOSIS — I110 Hypertensive heart disease with heart failure: Secondary | ICD-10-CM | POA: Diagnosis not present

## 2021-02-07 DIAGNOSIS — F419 Anxiety disorder, unspecified: Secondary | ICD-10-CM | POA: Diagnosis not present

## 2021-02-07 DIAGNOSIS — E669 Obesity, unspecified: Secondary | ICD-10-CM | POA: Diagnosis not present

## 2021-02-07 LAB — BASIC METABOLIC PANEL
Anion gap: 10 (ref 5–15)
BUN: 27 mg/dL — ABNORMAL HIGH (ref 6–20)
CO2: 19 mmol/L — ABNORMAL LOW (ref 22–32)
Calcium: 8.8 mg/dL — ABNORMAL LOW (ref 8.9–10.3)
Chloride: 108 mmol/L (ref 98–111)
Creatinine, Ser: 1.29 mg/dL — ABNORMAL HIGH (ref 0.61–1.24)
GFR, Estimated: >60 mL/min (ref >60)
Glucose, Bld: 139 mg/dL — ABNORMAL HIGH (ref 70–99)
Potassium: 4.3 mmol/L (ref 3.5–5.1)
Sodium: 137 mmol/L (ref 135–145)

## 2021-02-07 LAB — MAGNESIUM: Magnesium: 2.1 mg/dL (ref 1.7–2.4)

## 2021-02-07 MED ORDER — METOPROLOL SUCCINATE ER 100 MG PO TB24
100.0000 mg | ORAL_TABLET | Freq: Every day | ORAL | 3 refills | Status: DC
  Filled 2021-02-07 (×2): qty 90, 90d supply, fill #0

## 2021-02-07 MED ORDER — METOPROLOL SUCCINATE ER 100 MG PO TB24: 100.0000 mg | ORAL_TABLET | Freq: Every day | ORAL | 3 refills | Status: DC

## 2021-02-07 MED ORDER — SPIRONOLACTONE 25 MG PO TABS
12.5000 mg | ORAL_TABLET | Freq: Every day | ORAL | 3 refills | Status: DC
  Filled 2021-02-07 (×2): qty 45, 90d supply, fill #0

## 2021-02-07 MED ORDER — SPIRONOLACTONE 25 MG PO TABS: 12.5000 mg | ORAL_TABLET | Freq: Every day | ORAL | 3 refills | Status: DC

## 2021-02-07 NOTE — Patient Instructions (Addendum)
START Sprinolactone 12.5mg  (1/2 tab) daily  STOP Metoprolol Tartrate 50mg  twice a day  START Metoprolol Succinate (Toprol XL) 100mg  (1 tab) daily  Labs in 1 week at the Heart and Vascular clinic on May 6th, 2022 at 8:30am, Garage code [id number] We will only contact you if something comes back abnormal or we need to make some changes. Otherwise no news is good news!  Please follow up with General Cardiology  Please call office at [phone] option [phone] if you have any questions or concerns.   At the [facility], you and your health needs are our priority. As part of our continuing mission to provide you with exceptional heart care, we have created designated Provider Care Teams. These Care Teams include your primary Cardiologist (physician) and Advanced Practice Providers (APPs- Physician Assistants and Nurse Practitioners) who all work together to provide you with the care you need, when you need it.   You may see any of the following providers on your designated Care Team at your next follow up: [id number] [phone] . Dr [name] . Dr Arvilla Meres . Marca Ancona, NP . Thornell Mule, PA . Tonye Becket [place],NP . Robbie Lis, PharmD   Please be sure to bring in all your medications bottles to every appointment.

## 2021-02-07 NOTE — Addendum Note (Signed)
Encounter addended by: Shona Simpson, RN on: 02/07/2021 12:03 PM  Actions taken: Visit diagnoses modified, Order list changed, Diagnosis association updated

## 2021-02-07 NOTE — Telephone Encounter (Signed)
Heart Failure Nurse Navigator Progress Note  Called to confirm Heart & Vascular Transitions of Care appointment today. Pt has 0930 appt with AF clinic, able to change HV TOC appt to 10AM after AF clinic for patient convenience. Patient reminded to bring all medications and pill box organizer with them. Confirmed patient has transportation. Gave directions, instructed to utilize valet parking.  Confirmed appointment prior to ending call.   Ozella Rocks, RN, BSN Heart Failure Nurse Navigator [phone]

## 2021-02-07 NOTE — Progress Notes (Signed)
Heart and Vascular Center Transitions of Care Clinic Heart Failure Pharmacist Encounter  PCP: Agapito Games, MD PCP-Cardiologist: Meriam Sprague, MD  HPI:  55 yo M with PMH of HTN, obesity, and prior alcoholism. He was admitted to [facility] from 01/27/21-02/01/21 for afib with RVR. An ECHO was done on 01/28/21 that showed LVEF of 35-40% and TEE/DCCV was done on 01/29/21 with LVEF of 30-35%. Unfortunately, the DCCV was unsuccessful and he was started on Tikosyn and then converted to NSR.   Today, Joe Morrison presents to the Heart Failure TOC Clinic for follow up. He reports still having some shortness of breath but contributes this more to his hospital stay and less related to fluid. He denies having orthopnea/PND, edema, lightheadedness or dizziness. He has been checking his weights and BP regularly at home. He is following a low sodium and fluid-restricted diet and takes all medications as prescribed (uses a pill box).  HF Medications: Metoprolol tartrate 50 mg BID Entresto 24/26 mg BID  Has the patient been experiencing any side effects to the medications prescribed?  no  Does the patient have any problems obtaining medications due to transportation or finances?   no  Understanding of regimen: good Understanding of indications: good Potential of compliance: good Patient understands to avoid NSAIDs. Patient understands to avoid decongestants.   Pertinent Lab Values: . Serum creatinine 1.29, BUN 27, Potassium 4.3, Sodium 137, BNP 475.5, Magnesium 2.1   Vital Signs: . Weight: 350 lbs (discharge weight: 351 lbs) . Blood pressure: 138/82 mmHg  . Heart rate: 80 bpm   Medication Assistance / Insurance Benefits Check: Does the patient have prescription insurance?  Yes Type of insurance plan: commercial insurance  Outpatient Pharmacy:  Current outpatient pharmacy: [facility] Was the [facility] pharmacy used to supply discharge medications? no  Is the patient willing to transition  their outpatient pharmacy to utilize a [facility] outpatient pharmacy with or without mail order?   Yes - switching to [facility]  Assessment: 1) Chronic systolic CHF (EF 96-75%), due to NICM. NYHA class II symptoms. - Stop metoprolol tartrate 50 mg BID and start metoprolol XL 100 mg daily - Continue Entresto 24/26 mg BID - Start spironolactone 12.5 mg daily - Consider starting Farxiga 10 mg daily at follow up visit   Plan: 1) Medication changes: - Stop metoprolol tartrate 50 mg BID - Start metoprolol XL 100 mg daily - Start spironolactone 12.5 mg daily  2) Patient Assistance: - Has commercial insurance - patient states he does not want to use additional copay cards for Entresto  3) Follow up: - Next appointment with lab in 1 week; and on 03/17/21 with Peggyann Shoals, PharmD, BCPS Heart Failure Transitions of Care Clinic Pharmacist [phone]

## 2021-02-10 ENCOUNTER — Telehealth: Payer: Self-pay | Admitting: Cardiology

## 2021-02-10 NOTE — Telephone Encounter (Signed)
Will route this message to our Heart Failure Clinic, where pt is closely followed there, and at afib clinic.

## 2021-02-10 NOTE — Telephone Encounter (Signed)
Pt in NSR. Will defer to heart failure for potential fluid retention.

## 2021-02-10 NOTE — Telephone Encounter (Signed)
Staff message sent to [facility] scheduling and team lead to call the pt this week and arrange for him a post-hospital follow-up appt with Dr. Shari Prows or an APP at our office, or [facility] office.

## 2021-02-10 NOTE — Telephone Encounter (Signed)
Per Dr Frances Furbish he can see pt one more time if needed. Called and discussed w/pt his SOB usually occurs in evening or at night when he laying down, improves with elevation. He states wt is stable, no LE edema, but feels he has some abd "tightness". Pt states when symptoms start he gets worked up and worried which makes it worse. We discussed symptoms and anxiety. Appt sch for tomorrow in [facility] clinic for volume assessment and further education.

## 2021-02-10 NOTE — Telephone Encounter (Signed)
Pt c/o Shortness Of Breath: STAT if SOB developed within the last 24 hours or pt is noticeably SOB on the phone  1. Are you currently SOB (can you hear that pt is SOB on the phone)? no  2. How long have you been experiencing SOB? Even while in hospital  3. Are you SOB when sitting or when up moving around? Both  4. Are you currently experiencing any other symptoms? No   Patient states he has been having SOB for a while and was told part of it could be his afib and also a build up of fluid. He states they were holding off on a fluid pill. Patient states he has been doing a little bit of exercise and monitoring his food and his weight has stayed the same. He states he assumes he may still be retaining fluid. He states his SOB is not consistent, but definitely gets it moving around. He states he can walk on the treadmill at a low speed without difficulty. Patient states a lot of the time he will just be sitting and once it starts it lasts for a while. He states part of the problem is when he gets the SOB, he panics and is not sure is anxiety could be related. He is frustrated and worries he will continue to get more frustrated with the whole treatment.

## 2021-02-10 NOTE — Telephone Encounter (Signed)
This is NOT an AHF Clinic pt, he was only seen in the H&V TOC clinic and is to f/u with general cards who followed him in the hospital.  Will route to Dr Frances Furbish who saw pt in [facility] clinic for recommendations

## 2021-02-11 ENCOUNTER — Telehealth (HOSPITAL_COMMUNITY): Payer: Self-pay

## 2021-02-11 ENCOUNTER — Ambulatory Visit (HOSPITAL_COMMUNITY)
Admission: RE | Admit: 2021-02-11 | Discharge: 2021-02-11 | Disposition: A | Discharge status: Home / Self Care | Payer: Commercial Managed Care - PPO | Source: Ambulatory Visit | Attending: Internal Medicine | Admitting: Internal Medicine

## 2021-02-11 ENCOUNTER — Other Ambulatory Visit: Payer: Self-pay

## 2021-02-11 VITALS — BP 142/76 | HR 86 | Wt 354.0 lb

## 2021-02-11 DIAGNOSIS — I351 Nonrheumatic aortic (valve) insufficiency: Secondary | ICD-10-CM

## 2021-02-11 DIAGNOSIS — I4819 Other persistent atrial fibrillation: Secondary | ICD-10-CM | POA: Insufficient documentation

## 2021-02-11 DIAGNOSIS — Z8249 Family history of ischemic heart disease and other diseases of the circulatory system: Secondary | ICD-10-CM | POA: Insufficient documentation

## 2021-02-11 DIAGNOSIS — Z87891 Personal history of nicotine dependence: Secondary | ICD-10-CM | POA: Insufficient documentation

## 2021-02-11 DIAGNOSIS — I5043 Acute on chronic combined systolic (congestive) and diastolic (congestive) heart failure: Secondary | ICD-10-CM

## 2021-02-11 DIAGNOSIS — Z6841 Body Mass Index (BMI) 40.0 and over, adult: Secondary | ICD-10-CM | POA: Insufficient documentation

## 2021-02-11 DIAGNOSIS — Z7901 Long term (current) use of anticoagulants: Secondary | ICD-10-CM | POA: Insufficient documentation

## 2021-02-11 DIAGNOSIS — I5023 Acute on chronic systolic (congestive) heart failure: Secondary | ICD-10-CM | POA: Diagnosis present

## 2021-02-11 DIAGNOSIS — Z79899 Other long term (current) drug therapy: Secondary | ICD-10-CM | POA: Diagnosis not present

## 2021-02-11 DIAGNOSIS — E669 Obesity, unspecified: Secondary | ICD-10-CM | POA: Insufficient documentation

## 2021-02-11 DIAGNOSIS — I110 Hypertensive heart disease with heart failure: Secondary | ICD-10-CM | POA: Diagnosis not present

## 2021-02-11 MED ORDER — TORSEMIDE 20 MG PO TABS: ORAL_TABLET | ORAL | 0 refills | Status: DC

## 2021-02-11 MED ORDER — POTASSIUM CHLORIDE CRYS ER 20 MEQ PO TBCR: 20.0000 meq | EXTENDED_RELEASE_TABLET | Freq: Every day | ORAL | 0 refills | Status: DC

## 2021-02-11 NOTE — Telephone Encounter (Signed)
Heart Failure Nurse Navigator Progress Note  Call attempted to confirm HV TOC appt 3pm. HIPPA appropriate VM left with callback number.   Ozella Rocks, RN, BSN Heart Failure Nurse Navigator [phone]

## 2021-02-11 NOTE — Patient Instructions (Signed)
Start Torsemide 40 mg (2 tabs) Daily FOR 3 DAYS ONLY, then decrease to 20 mg (1 tab) Daily  Take Potassium (Klor-con) 20 meq (1 tab) Daily for 3 Days ONLY  Your physician recommends that you return for lab work on Friday 02/14/21 at 8:30 AM  Thank you for allowing [place] to provider your heart failure care after your recent hospitalization. Please follow-up with [place] in 1 week and then [facility], they will call you to schedule this appointment  If you have any questions, issues, or concerns before your next appointment please call our office at [phone], opt. 2 and leave a message for the triage nurse.

## 2021-02-11 NOTE — Progress Notes (Signed)
Heart and Vascular Center Transitions of Care Clinic  PCP: Nani Gasser Primary Cardiologist: Laurance Flatten  HPI:  Joe Morrison is a 55 y.o.  male  with a PMH significant for hypertension, obesity, alcohol use disorder in remission, recently diagnosed Afib, HFrEF.   Patient presented to [facility] with intermittent shortness of breath which worsened with exertion.  Apparently this had been worsening over the last few weeks before his presentation.  He was found in A. fib RVR at heart rate of 170 bpm.  He was placed on BiPAP for 2 hours and then transition to nasal cannula.  He was given IV Cardizem 20 mg x 1, IV Lopressor 5 mg x 1 and IV Lasix 40 mg x 1.  He was started on diltiazem drip and transferred to [facility] for admission under cardiology service. He was found to have elevated BNP 475.5, HS-troponin 47>>47, cr 1.06, UDS negative,  CXR with Cardiac enlargement with mild vascular congestion and interstitial edema.  During his workup at [facility] he had a TTE on 4/19 with  EF 35-40%, Severe LAE, Mod RAE, normal RV. This was followed by a TEE  with EF 30-35%, Severe LAE, mild MR, Mod TR, Severe AR.  He was started on eliquis and underwent TEE/DCCV however failed to cardiovert despite shock x 4. No apparent NSR. EP team was asked to see for consideration of inpatient tikosyn loading.  Pt converted to sinus rhythm on Tikosyn 500 mcg BID. Stable QTc at ~475 ms.  He was continued on metoprolol tartrate 50mg  BID and Tikosyn BID.  He maintained NSR and was discharged with outpatient follow up. Difficult to say how much diuresis was achieved based on weights and I's and O's seems that most of his symptoms were related to afib but he did receive one dose of IV lasix at 40mg  and transitioned to oral 40mg  for a day as well. was started and lasix d/ced.    Was drinking 22 beers per day until 2001.  Has not drank any since.  Quit smoking in 2000.     Last visit 02/07/21 felt like things were going in the right direction with his breathing, discharge weight 351lbs, was 350lbs at our visit.  Started on spironolactone in addition to his Entresto and toprol XL.    Called the office for increased shortness of breath, orthopnea.  Seen for these symptoms today and he has gained about 4lbs since our visit.  Tells me he's walking on the treadmill since Monday and doing okay with that but that his shortness of breath seems to be worse at night when laying flat and does okay with exertion.  Still has not received sleep study, not scheduled.     ROS: All systems negative except as listed in HPI, PMH and Problem List.  SH:  Social History   Socioeconomic History  . Marital status: Single    Spouse name: Not on file  . Number of children: 0  . Years of education: Not on file  . Highest education level: Some college, no degree  Occupational History  . Occupation: 2001  Tobacco Use  . Smoking status: Former Smoker    Packs/day: 2.00    Years: 16.00    Pack years: 32.00    Types: Cigarettes    Quit date: 04/27/1999    Years since quitting: 21.8  . Smokeless tobacco: Never Used  Vaping Use  . Vaping Use: Never used  Substance and Sexual Activity  . Alcohol use: No    Comment: Former Advice worker in 06/239. former heavy drinker 20beers/night.  . Drug use: Not Currently    Types: Marijuana, MDMA (Ecstacy), Oxycodone    Comment: stopped in 2001  . Sexual activity: Not Currently    Partners: Male    Birth control/protection: None  Other Topics Concern  . Not on file  Social History Narrative   Working out daily with a Psychologist, educational. No partner   Social Determinants of Health   Financial Resource Strain: Low Risk   . Difficulty of Paying Living Expenses: Not hard at all  Food Insecurity: No Food Insecurity  . Worried About Programme researcher, broadcasting/film/video in the Last Year: Never true  . Ran Out of Food in the Last Year: Never true   Transportation Needs: No Transportation Needs  . Lack of Transportation (Medical): No  . Lack of Transportation (Non-Medical): No  Physical Activity: Sufficiently Active  . Days of Exercise per Week: 3 days  . Minutes of Exercise per Session: 60 min  Stress: Stress Concern Present  . Feeling of Stress : Rather much  Social Connections: Not on file  Intimate Partner Violence: Not on file    FH:  Family History  Problem Relation Age of Onset  . Diabetes Mother   . Hypertension Mother   . Heart attack Father 67  . Alcohol abuse Other     Past Medical History:  Diagnosis Date  . Alcoholic (HCC)    recovering since 10/1999  . Hypertension     Current Outpatient Medications  Medication Sig Dispense Refill  . apixaban (ELIQUIS) 5 MG TABS tablet Take 1 tablet (5 mg total) by mouth 2 (two) times daily. 60 tablet 1  . dofetilide (TIKOSYN) 500 MCG capsule Take 1 capsule (500 mcg total) by mouth 2 (two) times daily. 60 capsule 1  . metoprolol succinate (TOPROL-XL) 100 MG 24 hr tablet Take 1 tablet (100 mg total) by mouth daily. Take with or immediately following a meal. 90 tablet 3  . Multiple Vitamin (MULTIVITAMIN WITH MINERALS) TABS tablet Take 1 tablet by mouth daily.    . Multiple Vitamins-Minerals (AIRBORNE PO) Take 1 packet by mouth daily.    . sacubitril-valsartan (ENTRESTO) 24-26 MG Take 1 tablet by mouth 2 (two) times daily. 60 tablet 1  . spironolactone (ALDACTONE) 25 MG tablet Take 1/2 tablets (12.5 mg total) by mouth daily. 45 tablet 3   No current facility-administered medications for this encounter.    Vitals:   02/11/21 1516  BP: (!) 142/76  Pulse: 86  SpO2: 95%  Weight: (!) 160.6 kg (354 lb)    PHYSICAL EXAM: Cardiac: JVD difficult to assess, normal rate and rhythm, clear s1 and s2, no murmurs but heart sounds distant, rubs or gallops, 1+ LE edema Pulmonary: CTAB, not in distress Abdominal: non distended abdomen, soft and nontender Psych: Alert, conversant,  in good spirits    ECG   NSR rate 90, QT/QTcB 382/467 ms  POCUS: IVC dilated with <50% respiratory variation IJ engorged dilated difficult to compress  ASSESSMENT & PLAN: Acute on Chronic Systolic CHF: -new diagnosis in the setting of afib w/RVR -TTE 01/2021  EF 35-40%, Severe LAE, Mod RAE, normal RV. This was followed by a TEE  with EF 30-35%, Severe LAE, mild MR, Mod TR, Severe AI. -no s/s of ischemia and troponin flat during admission felt to be due to afib  -NYHA Class III symptoms but severely deconditioned and BMI  51, volume status up, weight up 5lbs,  -start torsemide 40 and K for 3 days then continue torsemide 20 daily until FU visit in one week  -Continue Entresto 49/51 -continue toprol XL 100mg  -continue spironolactone 12.5mg  daily -PCP has ordered a sleep study -Assess EF on ECHO after medical therapy/maintenance of NSR expect this will improve.    Severe AI: -continue to intensify medical therapy and follow by repeat ECHO  Persistent Afib: -01/2021 s/p attempt at DCCV which was unsuccesful followed by tikosyn loading and conversion to NSR -CHADS2VASC is 2, on eliquis -remains in NSR,continue tikosyn  -continue toprol XL 100mg  as above -PCP has ordered a sleep study but has not been scheduled  Obesity: -PCP has ordered a sleep study has not been scheduled yet -working with a trainer currently for exercise -Doesn't currently want referral to healthy weight and wellness but will think about it   Follow up with me in one week then general cardiology

## 2021-02-11 NOTE — Telephone Encounter (Signed)
Heart Failure Nurse Navigator Progress Note  Pt called regarding message from [facility] Dr. Shari Prows setting up hospital f/u appt.  Suggested pt wait until seen today in HV TOC clinic at 3pm, speak with Dr. Frances Furbish about timing of bridging appt to general cardiology with Dr. Shari Prows.   Pt also asked about lab work that is scheduled for this coming Friday 5/6 @ 0830, asking if it can be drawn today. Deferred to clinic staff.   Ozella Rocks, RN, BSN Heart Failure Nurse Navigator [phone]

## 2021-02-12 ENCOUNTER — Other Ambulatory Visit (HOSPITAL_COMMUNITY): Payer: Self-pay | Admitting: Internal Medicine

## 2021-02-12 NOTE — Telephone Encounter (Signed)
   02/11/21 11:01am LVM to schedule hosp f/u with Dr. Shari Prows or APP per staff message - LCN

## 2021-02-12 NOTE — Telephone Encounter (Signed)
This is a CHF pt 

## 2021-02-13 ENCOUNTER — Other Ambulatory Visit (HOSPITAL_COMMUNITY): Payer: Self-pay | Admitting: Internal Medicine

## 2021-02-13 NOTE — Telephone Encounter (Signed)
02/13/21 8:37am LVM to schedule hosp f/u with Dr. Shari Prows or APP per staff message - LCN

## 2021-02-13 NOTE — Telephone Encounter (Signed)
This is a CHF pt 

## 2021-02-13 NOTE — Telephone Encounter (Signed)
Pt will come into the clinic to see Dr. Shari Prows, on 02/25/21 at 0800.  Pt made aware of appt date and time by Scheduling dept.

## 2021-02-14 ENCOUNTER — Ambulatory Visit (HOSPITAL_COMMUNITY)
Admission: RE | Admit: 2021-02-14 | Discharge: 2021-02-14 | Disposition: A | Discharge status: Home / Self Care | Payer: Commercial Managed Care - PPO | Source: Ambulatory Visit | Attending: Cardiology | Admitting: Cardiology

## 2021-02-14 ENCOUNTER — Other Ambulatory Visit: Payer: Self-pay

## 2021-02-14 DIAGNOSIS — I5042 Chronic combined systolic (congestive) and diastolic (congestive) heart failure: Secondary | ICD-10-CM | POA: Insufficient documentation

## 2021-02-14 LAB — BASIC METABOLIC PANEL
Anion gap: 9 (ref 5–15)
BUN: 18 mg/dL (ref 6–20)
CO2: 29 mmol/L (ref 22–32)
Calcium: 10.1 mg/dL (ref 8.9–10.3)
Chloride: 101 mmol/L (ref 98–111)
Creatinine, Ser: 1.38 mg/dL — ABNORMAL HIGH (ref 0.61–1.24)
GFR, Estimated: >60 mL/min (ref >60)
Glucose, Bld: 119 mg/dL — ABNORMAL HIGH (ref 70–99)
Potassium: 4 mmol/L (ref 3.5–5.1)
Sodium: 139 mmol/L (ref 135–145)

## 2021-02-18 ENCOUNTER — Encounter (HOSPITAL_COMMUNITY): Payer: Self-pay

## 2021-02-18 ENCOUNTER — Other Ambulatory Visit: Payer: Self-pay

## 2021-02-18 ENCOUNTER — Ambulatory Visit (HOSPITAL_COMMUNITY)
Admission: RE | Admit: 2021-02-18 | Discharge: 2021-02-18 | Disposition: A | Discharge status: Home / Self Care | Payer: Commercial Managed Care - PPO | Source: Ambulatory Visit | Attending: Internal Medicine | Admitting: Internal Medicine

## 2021-02-18 VITALS — BP 156/98 | HR 78 | Wt 323.8 lb

## 2021-02-18 DIAGNOSIS — Z7901 Long term (current) use of anticoagulants: Secondary | ICD-10-CM | POA: Insufficient documentation

## 2021-02-18 DIAGNOSIS — I110 Hypertensive heart disease with heart failure: Secondary | ICD-10-CM | POA: Insufficient documentation

## 2021-02-18 DIAGNOSIS — E669 Obesity, unspecified: Secondary | ICD-10-CM | POA: Diagnosis not present

## 2021-02-18 DIAGNOSIS — I351 Nonrheumatic aortic (valve) insufficiency: Secondary | ICD-10-CM

## 2021-02-18 DIAGNOSIS — Z87891 Personal history of nicotine dependence: Secondary | ICD-10-CM | POA: Insufficient documentation

## 2021-02-18 DIAGNOSIS — Z79899 Other long term (current) drug therapy: Secondary | ICD-10-CM | POA: Diagnosis not present

## 2021-02-18 DIAGNOSIS — Z8249 Family history of ischemic heart disease and other diseases of the circulatory system: Secondary | ICD-10-CM | POA: Insufficient documentation

## 2021-02-18 DIAGNOSIS — I5042 Chronic combined systolic (congestive) and diastolic (congestive) heart failure: Secondary | ICD-10-CM | POA: Insufficient documentation

## 2021-02-18 DIAGNOSIS — I4819 Other persistent atrial fibrillation: Secondary | ICD-10-CM | POA: Diagnosis not present

## 2021-02-18 DIAGNOSIS — Z6841 Body Mass Index (BMI) 40.0 and over, adult: Secondary | ICD-10-CM | POA: Insufficient documentation

## 2021-02-18 MED ORDER — SACUBITRIL-VALSARTAN 24-26 MG PO TABS: 2.0000 | ORAL_TABLET | Freq: Two times a day (BID) | ORAL | 1 refills | Status: DC

## 2021-02-18 MED ORDER — TORSEMIDE 20 MG PO TABS: 20.0000 mg | ORAL_TABLET | ORAL | 0 refills | Status: DC | PRN

## 2021-02-18 NOTE — Patient Instructions (Signed)
Thank you for allowing [place] to provider your heart failure care today.  Please follow up with Dr. Shari Prows next week.  Medication Changes:  Entresto increased to 2 tablets 49/51mg  twice daily Torsemide changed to 1-2 tablets as needed for weight gain, edema

## 2021-02-18 NOTE — Progress Notes (Addendum)
[facility]  PCP: Nani Gasser Primary Cardiologist: Laurance Flatten  HPI:  Joe Morrison is a 55 y.o.  male  with a PMH significant for hypertension, obesity, alcohol use disorder in remission, recently diagnosed Afib, HFrEF.   Patient presented to [facility] with intermittent shortness of breath which worsened with exertion.  Apparently this had been worsening over the last few weeks before his presentation.  He was found in A. fib RVR at heart rate of 170 bpm.  He was placed on BiPAP for 2 hours and then transition to nasal cannula.  He was given IV Cardizem 20 mg x 1, IV Lopressor 5 mg x 1 and IV Lasix 40 mg x 1.  He was started on diltiazem drip and transferred to [facility] for admission under cardiology service. He was found to have elevated BNP 475.5, HS-troponin 47>>47, cr 1.06, UDS negative,  CXR with Cardiac enlargement with mild vascular congestion and interstitial edema.  During his workup at [facility] he had a TTE on 4/19 with  EF 35-40%, Severe LAE, Mod RAE, normal RV. This was followed by a TEE  with EF 30-35%, Severe LAE, mild MR, Mod TR, Severe AR.  He was started on eliquis and underwent TEE/DCCV however failed to cardiovert despite shock x 4. No apparent NSR. EP team was asked to see for consideration of inpatient tikosyn loading.  Pt converted to sinus rhythm on Tikosyn 500 mcg BID. Stable QTc at ~475 ms.  He was continued on metoprolol tartrate 50mg  BID and Tikosyn BID.  He maintained NSR and was discharged with outpatient follow up. Difficult to say how much diuresis was achieved based on weights and I's and O's seems that most of his symptoms were related to afib but he did receive one dose of IV lasix at 40mg  and transitioned to oral 40mg  for a day as well. was started and lasix d/ced.    Was drinking 22 beers per day until 2001.  Has not drank any since.  Quit smoking in 2000.     02/07/21 felt like things were going in the right direction with his breathing, discharge weight 351lbs, was 350lbs at our visit.  Started on spironolactone in addition to his Entresto and toprol XL.    Saw him for a follow up visit one week ago, weight was going in the wrong direction, increased shortness of breath, orthopnea.  Volume status on physical exam difficult to assess due to body habitus, also too large for REDS clip.  POCUS exam demonstrated dilated IVC with <50% respiratory variability and engorged IJ vein.  Initially started him on  torsemide 40 and K 2002 for 3 days then continued torsemide 20 daily.  Had repeat labs set up 3 days later 5/6.  Called to check on the patient and go over labs he reported 24lb weight loss so we decreased torsemide to 20 every other day.  Last torsemide dose on Sunday.  Weight down to 324lbs.  Checking bp at home usually around 140/80.  No longer getting orthopnea or PND.  Feeling better than he has in years.     ROS: All systems negative except as listed in HPI, PMH and Problem List.  SH:  Social History   Socioeconomic History  . Marital status: Single    Spouse name: Not on file  . Number of children: 0  . Years of education: Not on file  . Highest education  level: Some college, no degree  Occupational History  . Occupation: Engineer, drilling  Tobacco Use  . Smoking status: Former Smoker    Packs/day: 2.00    Years: 16.00    Pack years: 32.00    Types: Cigarettes    Quit date: 04/27/1999    Years since quitting: 21.8  . Smokeless tobacco: Never Used  Vaping Use  . Vaping Use: Never used  Substance and Sexual Activity  . Alcohol use: No    Comment: Former Advice worker in 10/6604. former heavy drinker 20beers/night.  . Drug use: Not Currently    Types: Marijuana, MDMA (Ecstacy), Oxycodone    Comment: stopped in 2001  . Sexual activity: Not Currently    Partners: Male    Birth control/protection: None  Other Topics Concern   . Not on file  Social History Narrative   Working out daily with a Psychologist, educational. No partner   Social Determinants of Health   Financial Resource Strain: Low Risk   . Difficulty of Paying Living Expenses: Not hard at all  Food Insecurity: No Food Insecurity  . Worried About Programme researcher, broadcasting/film/video in the Last Year: Never true  . Ran Out of Food in the Last Year: Never true  Transportation Needs: No Transportation Needs  . Lack of Transportation (Medical): No  . Lack of Transportation (Non-Medical): No  Physical Activity: Sufficiently Active  . Days of Exercise per Week: 3 days  . Minutes of Exercise per Session: 60 min  Stress: Stress Concern Present  . Feeling of Stress : Rather much  Social Connections: Not on file  Intimate Partner Violence: Not on file    FH:  Family History  Problem Relation Age of Onset  . Diabetes Mother   . Hypertension Mother   . Heart attack Father 84  . Alcohol abuse Other     Past Medical History:  Diagnosis Date  . Alcoholic (HCC)    recovering since 10/1999  . Hypertension     Current Outpatient Medications  Medication Sig Dispense Refill  . apixaban (ELIQUIS) 5 MG TABS tablet Take 1 tablet (5 mg total) by mouth 2 (two) times daily. 60 tablet 1  . dofetilide (TIKOSYN) 500 MCG capsule Take 1 capsule (500 mcg total) by mouth 2 (two) times daily. 60 capsule 1  . metoprolol succinate (TOPROL-XL) 100 MG 24 hr tablet Take 1 tablet (100 mg total) by mouth daily. Take with or immediately following a meal. 90 tablet 3  . Multiple Vitamin (MULTIVITAMIN WITH MINERALS) TABS tablet Take 1 tablet by mouth daily.    . Multiple Vitamins-Minerals (AIRBORNE PO) Take 1 packet by mouth daily.    . sacubitril-valsartan (ENTRESTO) 24-26 MG Take 1 tablet by mouth 2 (two) times daily. 60 tablet 1  . spironolactone (ALDACTONE) 25 MG tablet Take 1/2 tablets (12.5 mg total) by mouth daily. 45 tablet 3  . torsemide (DEMADEX) 20 MG tablet Take 20 mg by mouth every other day.      No current facility-administered medications for this encounter.    Vitals:   02/18/21 1409  BP: (!) 156/98  Pulse: 78  SpO2: 98%  Weight: (!) 146.9 kg (323 lb 12.8 oz)    PHYSICAL EXAM: Cardiac: JVD difficult to assess, normal rate and rhythm, clear s1 and s2, no murmurs but heart sounds distant, rubs or gallops, Trace LE edema Pulmonary: CTAB, not in distress Abdominal: non distended abdomen, soft and nontender Psych: Alert, conversant, in good spirits  POCUS: IVC collapsible with >  50% respiratory variability IJ collapsible estimated JVP 7-8cm H2O  ASSESSMENT & PLAN: Chronic Combined Systolic and Diastolic CHF: -new diagnosis in the setting of afib w/RVR -TTE 01/2021  EF 35-40%, Severe LAE, Mod RAE, normal RV. This was followed by a TEE  with EF 30-35%, Severe LAE, mild MR, Mod TR, Severe AI. -no s/s of ischemia and troponin flat during admission felt to be due to afib  -NYHA Class II symptoms.  Still somewhat deconditioned and BMI 51, volume status euvolemic today -Increase entresto to 49/51, switch torsemide to 1-2 tabs PRN patient given education on this -continue toprol XL 100mg  -continue spironolactone 12.5mg  daily -can probably add sglt2i at next visit -sleep study arranged by PCP -Assess EF on ECHO after medical therapy/maintenance of NSR expect this will improve.    Severe AI: -continue to intensify medical therapy and follow by repeat ECHO  Persistent Afib: -01/2021 s/p attempt at DCCV which was unsuccesful followed by tikosyn loading and conversion to NSR -CHADS2VASC is 2, on eliquis -continue tikosyn  -continue toprol XL 100mg  as above -PCP arranging sleep study  Obesity: -working with a trainer currently for exercise -working on diet changes   Follow up with general cardiology

## 2021-02-21 NOTE — Progress Notes (Deleted)
Cardiology Office Note:    Date:  02/21/2021   ID:  [name], DOB [birth year], MRN [id number]  PCP:  Agapito Games, MD   [facility] Providers Cardiologist:  Meriam Sprague, MD Electrophysiologist:  Lewayne Bunting, MD {     Referring MD: Agapito Games, *    History of Present Illness:    Joe Morrison is a 55 y.o. male with a hx of newly diagnosed atrial fibrillation with failed DCCV now s/p tikosyn load, newly diagnosed combined systolic and diastolic heart failure, hypertension, severe aortic regurgitation, and morbid obesity who presents to clinic for follow-up from recent hospital admission.  Patient was admitted from 01/27/21-02/01/21 where he presented with worsenign shortness of breath and volume overload found to have new onest atrial fibrillation with RVR as well was new acute systolic and diastolic heart failure with LVEF 35-40%. BNP on admission 475. He was diuresed with improvement and underwent DCCV, however, failed to return to NSR. EP was consulted and he subsequently underwent tikosyn load with successful return to NSR. He now presents to clinic for follow-up.  Past Medical History:  Diagnosis Date  . Alcoholic (HCC)    recovering since 10/1999  . Hypertension     Past Surgical History:  Procedure Laterality Date  . CARDIOVERSION N/A 01/29/2021   Procedure: CARDIOVERSION;  Surgeon: Wendall Stade, MD;  Location: [facility] ENDOSCOPY;  Service: Cardiovascular;  Laterality: N/A;  . MOUTH SURGERY     Dental work  . TEE WITHOUT CARDIOVERSION N/A 01/29/2021   Procedure: TRANSESOPHAGEAL ECHOCARDIOGRAM (TEE);  Surgeon: Wendall Stade, MD;  Location: [facility] ENDOSCOPY;  Service: Cardiovascular;  Laterality: N/A;  . TONSILLECTOMY  1972    Current Medications: No outpatient medications have been marked as taking for the 02/25/21 encounter (Appointment) with Meriam Sprague, MD.     Allergies:   Patient has no known allergies.   Social History    Socioeconomic History  . Marital status: Single    Spouse name: Not on file  . Number of children: 0  . Years of education: Not on file  . Highest education level: Some college, no degree  Occupational History  . Occupation: Engineer, drilling  Tobacco Use  . Smoking status: Former Smoker    Packs/day: 2.00    Years: 16.00    Pack years: 32.00    Types: Cigarettes    Quit date: 04/27/1999    Years since quitting: 21.8  . Smokeless tobacco: Never Used  Vaping Use  . Vaping Use: Never used  Substance and Sexual Activity  . Alcohol use: No    Comment: Former Advice worker in 06/3817. former heavy drinker 20beers/night.  . Drug use: Not Currently    Types: Marijuana, MDMA (Ecstacy), Oxycodone    Comment: stopped in 2001  . Sexual activity: Not Currently    Partners: Male    Birth control/protection: None  Other Topics Concern  . Not on file  Social History Narrative   Working out daily with a Psychologist, educational. No partner   Social Determinants of Health   Financial Resource Strain: Low Risk   . Difficulty of Paying Living Expenses: Not hard at all  Food Insecurity: No Food Insecurity  . Worried About Programme researcher, broadcasting/film/video in the Last Year: Never true  . Ran Out of Food in the Last Year: Never true  Transportation Needs: No Transportation Needs  . Lack of Transportation (Medical): No  . Lack of Transportation (Non-Medical): No  Physical Activity:  Sufficiently Active  . Days of Exercise per Week: 3 days  . Minutes of Exercise per Session: 60 min  Stress: Stress Concern Present  . Feeling of Stress : Rather much  Social Connections: Not on file     Family History: The patient's ***family history includes Alcohol abuse in an other family member; Diabetes in his mother; Heart attack (age of onset: 46) in his father; Hypertension in his mother.  ROS:   Please see the history of present illness.    *** All other systems reviewed and are negative.  EKGs/Labs/Other Studies  Reviewed:    The following studies were reviewed today: Echo: 01/28/21  IMPRESSIONS    1. Left ventricular ejection fraction, by estimation, is 35-40%. The left  ventricle has moderately decreased function. Left ventricular endocardial  border not optimally defined to evaluate regional wall motion, even with  Definity contrast. There is mild  concentric left ventricular hypertrophy. Left ventricular diastolic  function could not be evaluated.  2. Right ventricular systolic function is normal. The right ventricular  size is normal. There is mildly elevated pulmonary artery systolic  pressure.  3. Left atrial size was severely dilated.  4. Right atrial size was moderately dilated.  5. The mitral valve is normal in structure. No evidence of mitral valve  regurgitation. No evidence of mitral stenosis.  6. The aortic valve is normal in structure. Aortic valve regurgitation is  not visualized. No aortic stenosis is present.  7. There is mild dilatation of the aortic root, measuring 46 mm. There is  mild dilatation of the ascending aorta, measuring 41 mm.  8. The inferior vena cava is dilated in size with <50% respiratory  variability, suggesting right atrial pressure of 15 mmHg.   EKG:  EKG is *** ordered today.  The ekg ordered today demonstrates ***  Recent Labs: 01/27/2021: ALT 34; B Natriuretic Peptide 475.5 01/28/2021: TSH 2.382 01/30/2021: Hemoglobin 13.7; Platelets 185 02/07/2021: Magnesium 2.1 02/14/2021: BUN 18; Creatinine, Ser 1.38; Potassium 4.0; Sodium 139  Recent Lipid Panel    Component Value Date/Time   CHOL 153 01/28/2021 0039   TRIG 165 (H) 01/28/2021 0039   HDL 28 (L) 01/28/2021 0039   CHOLHDL 5.5 01/28/2021 0039   VLDL 33 01/28/2021 0039   LDLCALC 92 01/28/2021 0039   LDLCALC 127 (H) 12/19/2018 0743     Risk Assessment/Calculations:   {Does this patient have ATRIAL FIBRILLATION?:[phone]}   Physical Exam:    VS:  There were no vitals taken for  this visit.    Wt Readings from Last 3 Encounters:  02/18/21 (!) 323 lb 12.8 oz (146.9 kg)  02/11/21 (!) 354 lb (160.6 kg)  02/07/21 (!) 350 lb 14.4 oz (159.2 kg)     GEN: *** Well nourished, well developed in no acute distress HEENT: Normal NECK: No JVD; No carotid bruits LYMPHATICS: No lymphadenopathy CARDIAC: ***RRR, no murmurs, rubs, gallops RESPIRATORY:  Clear to auscultation without rales, wheezing or rhonchi  ABDOMEN: Soft, non-tender, non-distended MUSCULOSKELETAL:  No edema; No deformity  SKIN: Warm and dry NEUROLOGIC:  Alert and oriented x 3 PSYCHIATRIC:  Normal affect   ASSESSMENT:    No diagnosis found. PLAN:    In order of problems listed above:  #Newly diagnosed A fib with RVR:  CHADs-vasc 2. Patient recently presented to [facility] for progressive SOB x2 weeks,intermittentorthopnea, lower extremity edema, and fatigue found to have new onset atrial fibrillation with RVR with HR 170s.  TTE with LVEF 35-40%, unable assess RWMA and diastolic  function, RV normal, LA severely dilated, RA moderately dilated, mild dilatation of the aortic root, measuring 46 mm,mild dilatation of the ascending aorta, measuring 41 mm. S/p failed coversion from TEE with DCCV x4on4/20/22. EP consulted and patient underwent Tikosyn563mcg BID load with successful conversion to NSR. , converted to SR  --Continue apixaban for [facility] --Continue dofetilide BID --Follow-up with EP as scheduled  #Newly diagnosedcombined systolic and diastolic heart failure TTE 01/27/21 with  LVEF 35-40%, unable assess RWMA and diastolic function, RV normal, LA severely dilated, RA moderately dilated, mild dilatation of the aortic root, measuring 46 mm,mild dilatation of the ascending aorta, measuring 41 mm. TEE with EF 30%,LVglobal hypokinesis,mild MR, severe AR, no LAA thrombus, moderateTR. HIV negative; TSH WNL; A1C 4.7%; LDL 92 from this admission. 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 --Repeat TTE in 6 weeks to reassess LVEF  #Monomorphic VT --Continue metop succinate 100mg  XL daily  #Severe Aortic regurgitation Noted on TEE, Echo did revealmild dilatation of the aortic root, measuring 46 mm,mild dilatation of the ascending aorta, measuring 41 mm. --Will need repeat TTE in 58months  #HTN -- Continue Entresto 24/26 BID -- Continue metop 100mg  XL daily  #Morbid obesity  -- ? Ozempic  #Snoring  -- Needs outpatient sleep study     {Are you ordering a CV Procedure (e.g. stress test, cath, DCCV, TEE, etc)?   Press F2        :[place]    Medication Adjustments/Labs and Tests Ordered: Current medicines are reviewed at length with the patient today.  Concerns regarding medicines are outlined above.  No orders of the defined types were placed in this encounter.  No orders of the defined types were placed in this encounter.   There are no Patient Instructions on file for this visit.   Signed, , MD  02/21/2021 8:54 AM    [facility]

## 2021-02-24 ENCOUNTER — Other Ambulatory Visit (HOSPITAL_COMMUNITY): Payer: Self-pay | Admitting: *Deleted

## 2021-02-24 DIAGNOSIS — I5042 Chronic combined systolic (congestive) and diastolic (congestive) heart failure: Secondary | ICD-10-CM

## 2021-02-24 MED ORDER — TORSEMIDE 20 MG PO TABS: 20.0000 mg | ORAL_TABLET | Freq: Every day | ORAL | 0 refills | Status: DC | PRN

## 2021-02-25 ENCOUNTER — Other Ambulatory Visit: Payer: Self-pay

## 2021-02-25 ENCOUNTER — Encounter: Payer: Self-pay | Admitting: Cardiology

## 2021-02-25 ENCOUNTER — Ambulatory Visit (INDEPENDENT_AMBULATORY_CARE_PROVIDER_SITE_OTHER): Payer: Commercial Managed Care - PPO | Admitting: Cardiology

## 2021-02-25 ENCOUNTER — Telehealth (HOSPITAL_COMMUNITY): Payer: Self-pay

## 2021-02-25 VITALS — BP 144/84 | HR 90 | Ht 69.0 in | Wt 318.4 lb

## 2021-02-25 DIAGNOSIS — I10 Essential (primary) hypertension: Secondary | ICD-10-CM | POA: Diagnosis not present

## 2021-02-25 DIAGNOSIS — I4819 Other persistent atrial fibrillation: Secondary | ICD-10-CM

## 2021-02-25 DIAGNOSIS — I472 Ventricular tachycardia: Secondary | ICD-10-CM

## 2021-02-25 DIAGNOSIS — I351 Nonrheumatic aortic (valve) insufficiency: Secondary | ICD-10-CM

## 2021-02-25 DIAGNOSIS — Z79899 Other long term (current) drug therapy: Secondary | ICD-10-CM | POA: Diagnosis not present

## 2021-02-25 DIAGNOSIS — I4729 Other ventricular tachycardia: Secondary | ICD-10-CM

## 2021-02-25 DIAGNOSIS — I5042 Chronic combined systolic (congestive) and diastolic (congestive) heart failure: Secondary | ICD-10-CM

## 2021-02-25 MED ORDER — ENTRESTO 49-51 MG PO TABS: 1.0000 | ORAL_TABLET | Freq: Two times a day (BID) | ORAL | 3 refills | Status: DC

## 2021-02-25 MED ORDER — DAPAGLIFLOZIN PROPANEDIOL 10 MG PO TABS: 10.0000 mg | ORAL_TABLET | Freq: Every day | ORAL | 6 refills | Status: DC

## 2021-02-25 NOTE — Patient Instructions (Signed)
Medication Instructions:   START TAKING FARXIGA 10 MG BY MOUTH DAILY BEFORE BREAKFAST  INCREASE YOUR ENTRESTO TO 49/51 MG BY MOUTH TWICE DAILY  *If you need a refill on your cardiac medications before your next appointment, please call your pharmacy*   Lab Work:  IN ONE WEEK--CHECK BMET AND MAGNESIUM LEVEL  If you have labs (blood work) drawn today and your tests are completely normal, you will receive your results only by: [name] MyChart Message (if you have MyChart) OR . A paper copy in the mail If you have any lab test that is abnormal or we need to change your treatment, we will call you to review the results.   You have been referred to CARDIAC REHAB AT [facility] FOR HEART FAILURE  You have been referred to OUR ELECTROPHYSIOLOGIST HERE IN THE OFFICE FOR MANAGEMENT OF YOUR AFIB AND BEING ON TIKOSYN   Testing/Procedures:  Your physician has requested that you have an echocardiogram. Echocardiography is a painless test that uses sound waves to create images of your heart. It provides your doctor with information about the size and shape of your heart and how well your heart's chambers and valves are working. This procedure takes approximately one hour. There are no restrictions for this procedure. ECHO SHOULD BE SCHEDULED IN EARLY July PER DR. Shari Prows, AND PRIOR TO HIS OFFICE VISIT APPOINTMENT WITH HER IN JULY   Follow-Up:  IN July WITH DR. Shari Prows IN THE CLINIC--PLEASE HAVE YOUR ECHO DONE PRIOR TO THIS APPOINTMENT

## 2021-02-25 NOTE — Telephone Encounter (Signed)
Called pt to see if he would like to come to [facility] or [place] since [place] in [place], left a message for pt to call back.

## 2021-02-25 NOTE — Progress Notes (Signed)
Cardiology Office Note:    Date:  02/25/2021   ID:  Joe Morrison, DOB 1966-09-15, MRN [id number]  PCP:  Agapito Games, MD   [facility] Providers Cardiologist:  Meriam Sprague, MD Electrophysiologist:  Lewayne Bunting, MD {     Referring MD: Agapito Games, *    History of Present Illness:    Joe Morrison is a 55 y.o. male with a hx of newly diagnosed atrial fibrillation with failed DCCV now s/p tikosyn load, newly diagnosed combined systolic and diastolic heart failure, hypertension, severe aortic regurgitation, and morbid obesity who presents to clinic for follow-up from recent hospital admission.  Patient was admitted from 01/27/21-02/01/21 where he presented with worsenign shortness of breath and volume overload found to have new onest atrial fibrillation with RVR as well was new acute systolic and diastolic heart failure with LVEF 35-40%. BNP on admission 475. He was diuresed with improvement and underwent DCCV, however, failed to return to NSR. EP was consulted and he subsequently underwent tikosyn load with successful return to NSR. He now presents to clinic for follow-up.   Today, he is still tolerating his medication. His breathing has improved significantly since his hospital visit, and reports he continues to stay in sinus rhythm. However, he still has some shortness of breath, especially climbing stairs, and notes having paranoid and anxious feelings because of this (average duration 10 sec). Occasionally he will have lightheadedness after standing or getting up too quickly. He denies any chest pain, palpitations, headaches, or syncope. Also has no lower extremity edema, orthopnea or PND.   Dr. Frances Furbish is following his progress at the Heart Failure clinic. Every day he monitors his weight and blood pressure. His weight is stable and he began working with a Systems analyst two weeks ago. He does not drink alcohol and continues to work on limiting his Na  intake. Currently he takes Torsemide every 2-3 days and has not seen any fluid buildup.   Yesterday, he returned to work. He is waiting to hear back from his PCP about setting up a sleep study.  Past Medical History:  Diagnosis Date  . Alcoholic (HCC)    recovering since 10/1999  . Hypertension     Past Surgical History:  Procedure Laterality Date  . CARDIOVERSION N/A 01/29/2021   Procedure: CARDIOVERSION;  Surgeon: Wendall Stade, MD;  Location: [facility] ENDOSCOPY;  Service: Cardiovascular;  Laterality: N/A;  . MOUTH SURGERY     Dental work  . TEE WITHOUT CARDIOVERSION N/A 01/29/2021   Procedure: TRANSESOPHAGEAL ECHOCARDIOGRAM (TEE);  Surgeon: Wendall Stade, MD;  Location: [facility] ENDOSCOPY;  Service: Cardiovascular;  Laterality: N/A;  . TONSILLECTOMY  1972    Current Medications: Current Meds  Medication Sig  . apixaban (ELIQUIS) 5 MG TABS tablet Take 1 tablet (5 mg total) by mouth 2 (two) times daily.  . dapagliflozin propanediol (FARXIGA) 10 MG TABS tablet Take 1 tablet (10 mg total) by mouth daily before breakfast.  . dofetilide (TIKOSYN) 500 MCG capsule Take 1 capsule (500 mcg total) by mouth 2 (two) times daily.  . metoprolol succinate (TOPROL-XL) 100 MG 24 hr tablet Take 1 tablet (100 mg total) by mouth daily. Take with or immediately following a meal.  . Multiple Vitamin (MULTIVITAMIN WITH MINERALS) TABS tablet Take 1 tablet by mouth daily.  . Multiple Vitamins-Minerals (AIRBORNE PO) Take 1 packet by mouth daily.  . sacubitril-valsartan (ENTRESTO) 49-51 MG Take 1 tablet by mouth 2 (two) times daily.  [name] spironolactone (  ALDACTONE) 25 MG tablet Take 1/2 tablets (12.5 mg total) by mouth daily.  [name] torsemide (DEMADEX) 20 MG tablet Take 1-2 tablets (20-40 mg total) by mouth daily as needed.  . [DISCONTINUED] sacubitril-valsartan (ENTRESTO) 24-26 MG Take 2 tablets by mouth 2 (two) times daily.     Allergies:   Patient has no known allergies.   Social History   Socioeconomic History  .  Marital status: Single    Spouse name: Not on file  . Number of children: 0  . Years of education: Not on file  . Highest education level: Some college, no degree  Occupational History  . Occupation: Engineer, drilling  Tobacco Use  . Smoking status: Former Smoker    Packs/day: 2.00    Years: 16.00    Pack years: 32.00    Types: Cigarettes    Quit date: 04/27/1999    Years since quitting: 21.8  . Smokeless tobacco: Never Used  Vaping Use  . Vaping Use: Never used  Substance and Sexual Activity  . Alcohol use: No    Comment: Former Advice worker in 03/9628. former heavy drinker 20beers/night.  . Drug use: Not Currently    Types: Marijuana, MDMA (Ecstacy), Oxycodone    Comment: stopped in 2001  . Sexual activity: Not Currently    Partners: Male    Birth control/protection: None  Other Topics Concern  . Not on file  Social History Narrative   Working out daily with a Psychologist, educational. No partner   Social Determinants of Health   Financial Resource Strain: Low Risk   . Difficulty of Paying Living Expenses: Not hard at all  Food Insecurity: No Food Insecurity  . Worried About Programme researcher, broadcasting/film/video in the Last Year: Never true  . Ran Out of Food in the Last Year: Never true  Transportation Needs: No Transportation Needs  . Lack of Transportation (Medical): No  . Lack of Transportation (Non-Medical): No  Physical Activity: Sufficiently Active  . Days of Exercise per Week: 3 days  . Minutes of Exercise per Session: 60 min  Stress: Stress Concern Present  . Feeling of Stress : Rather much  Social Connections: Not on file     Family History: The patient's family history includes Alcohol abuse in an other family member; Diabetes in his mother; Heart attack (age of onset: 79) in his father; Hypertension in his mother.  ROS:   Please see the history of present illness.    Review of Systems  Constitutional: Negative for diaphoresis and malaise/fatigue.  HENT: Negative for hearing  loss, nosebleeds and sore throat.   Eyes: Negative for blurred vision and double vision.  Respiratory: Positive for shortness of breath. Negative for sputum production and wheezing.   Cardiovascular: Negative for chest pain, palpitations, orthopnea, claudication, leg swelling and PND.  Gastrointestinal: Negative for heartburn, nausea and vomiting.  Genitourinary: Negative for dysuria and hematuria.  Musculoskeletal: Negative for falls and joint pain.  Neurological: Positive for dizziness. Negative for seizures, loss of consciousness and headaches.  Endo/Heme/Allergies: Does not bruise/bleed easily.  Psychiatric/Behavioral: Negative for depression and memory loss. The patient is nervous/anxious.      EKGs/Labs/Other Studies Reviewed:    The following studies were reviewed today:  TEE 01/29/2021: 1. DCC attempted x 4 with 200J biphasic and AP manual compression Patient  failed to convert to sinus.  2. Left ventricular ejection fraction, by estimation, is 30 to 35%. The  left ventricle has moderately decreased function. The left ventricle  demonstrates global hypokinesis.  The left ventricular internal cavity size  was severely dilated.  3. Right ventricular systolic function is normal. The right ventricular  size is normal.  4. Extensive 3D imaging of LAA performed. Left atrial size was severely  dilated. No left atrial/left atrial appendage thrombus was detected.  5. Right atrial size was mildly dilated.  6. 3 D imaging MV performed . The mitral valve is normal in structure.  Mild mitral valve regurgitation.  7. Tricuspid valve regurgitation is moderate.  8. The aortic valve is tricuspid. Aortic valve regurgitation is severe.   Echo: 01/28/21 IMPRESSIONS  1. Left ventricular ejection fraction, by estimation, is 35-40%. The left  ventricle has moderately decreased function. Left ventricular endocardial  border not optimally defined to evaluate regional wall motion, even with   Definity contrast. There is mild  concentric left ventricular hypertrophy. Left ventricular diastolic  function could not be evaluated.  2. Right ventricular systolic function is normal. The right ventricular  size is normal. There is mildly elevated pulmonary artery systolic  pressure.  3. Left atrial size was severely dilated.  4. Right atrial size was moderately dilated.  5. The mitral valve is normal in structure. No evidence of mitral valve  regurgitation. No evidence of mitral stenosis.  6. The aortic valve is normal in structure. Aortic valve regurgitation is  not visualized. No aortic stenosis is present.  7. There is mild dilatation of the aortic root, measuring 46 mm. There is  mild dilatation of the ascending aorta, measuring 41 mm.  8. The inferior vena cava is dilated in size with <50% respiratory  variability, suggesting right atrial pressure of 15 mmHg.   EKG:   02/25/2021: Sinus rhythm, IVCD, rate 90, QTC 513  Recent Labs: 01/27/2021: ALT 34; B Natriuretic Peptide 475.5 01/28/2021: TSH 2.382 01/30/2021: Hemoglobin 13.7; Platelets 185 02/07/2021: Magnesium 2.1 02/14/2021: BUN 18; Creatinine, Ser 1.38; Potassium 4.0; Sodium 139  Recent Lipid Panel    Component Value Date/Time   CHOL 153 01/28/2021 0039   TRIG 165 (H) 01/28/2021 0039   HDL 28 (L) 01/28/2021 0039   CHOLHDL 5.5 01/28/2021 0039   VLDL 33 01/28/2021 0039   LDLCALC 92 01/28/2021 0039   LDLCALC 127 (H) 12/19/2018 0743     Risk Assessment/Calculations:    CHA2DS2-VASc Score = 2  This indicates a 2.2% annual risk of stroke. The patient's score is based upon: CHF History: Yes HTN History: Yes Diabetes History: No Stroke History: No Vascular Disease History: No Age Score: 0 Gender Score: 0    Physical Exam:    VS:  BP (!) 144/84   Pulse 90   Ht 5\' 9"  (1.753 m)   Wt (!) 318 lb 6.4 oz (144.4 kg)   SpO2 95%   BMI 47.02 kg/m     Wt Readings from Last 3 Encounters:  02/25/21 (!) 318 lb  6.4 oz (144.4 kg)  02/18/21 (!) 323 lb 12.8 oz (146.9 kg)  02/11/21 (!) 354 lb (160.6 kg)     GEN: Well nourished, well developed in no acute distress HEENT: Normal NECK: No JVD; No carotid bruits LYMPHATICS: No lymphadenopathy CARDIAC: RRR, no murmurs, rubs, gallops RESPIRATORY:  Clear to auscultation without rales, wheezing or rhonchi  ABDOMEN: Morbidly obese, soft, non-tender, non-distended MUSCULOSKELETAL:  No edema; No deformity  SKIN: Warm and dry NEUROLOGIC:  Alert and oriented x 3 PSYCHIATRIC:  Normal affect   ASSESSMENT:    1. Chronic combined systolic and diastolic CHF (congestive heart failure) (HCC)   2.  Persistent atrial fibrillation (HCC)   3. HYPERTENSION, BENIGN SYSTEMIC   4. Medication management   5. Morbid obesity (HCC)   6. Nonrheumatic aortic valve insufficiency   7. Severe obesity (BMI >= 40) (HCC)   8. Monomorphic ventricular tachycardia (HCC)    PLAN:    In order of problems listed above:  #Newly diagnosed A fib with RVR:  CHADs-vasc 2. [facility] SOB x2 weeks,intermittentorthopnea, lower extremity edema, and fatigue found to have new onset atrial fibrillation with RVR with HR 170s.  TTE with LVEF 35-40%, unable assess RWMA and diastolic function, RV normal, LA severely dilated, RA moderately dilated, mild dilatation of the aortic root, measuring 46 mm,mild dilatation of the ascending aorta, measuring 41 mm. S/p failed coversion from TEE with DCCV x4on4/20/22. EP consulted and patient underwent Tikosyn54100mcg BID load with successful conversion to NSR. , converted to SR  --Continue apixaban for AC --Continue dofetilide 500mcg BID; Qtc 513 --Will refer back to EP for management of dofetilide; QtC 513 today  #Newly diagnosedcombined systolic and diastolic heart failure TTE 01/27/21 with  LVEF 35-40%, unable assess RWMA and diastolic function, RV normal, LA severely dilated, RA moderately dilated, mild  dilatation of the aortic root, measuring 46 mm,mild dilatation of the ascending aorta, measuring 41 mm. TEE with EF 30%,LVglobal hypokinesis,mild MR, severe AR, no LAA thrombus, moderateTR. HIV negative; TSH WNL; A1C 4.7%; LDL 92 from this admission. 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 --TTE 01/2021  EF 35-40%, Severe LAE, Mod RAE, normal RV. This was followed by a TEE  with EF 30-35%, Severe LAE, mild MR, Mod TR, Severe AI. --Increase Entresto49/51mg  BID --Continue spironolactone 12.5mg  daily  --Continue metoprolol 100mg  XL daily --Continue torsemide 20mg  as needed; has been euvolemic (currently taking 2-3 times per week) --Start Farxiga 10mg  daily --Repeat BMET next week --TTE in July to reassess LVEF --Refer to cardiac rehab per patient request --Low Na diet --Monitor daily weights  #Monomorphic VT --Continue metop succinate 100mg  XL daily --Refer to EP as above  #Severe Aortic regurgitation Noted on TEE, Echo did revealmild dilatation of the aortic root, measuring 46 mm,mild dilatation of the ascending aorta, measuring 41 mm. --Follow-up repeat TTE in 04/2021 for monitoring  #HTN: Controlled at home -- Continue Entresto 49/51 BID -- Continue metop 100mg  XL daily  #Morbid obesity  --Continue weight loss efforts --Consider ozempic at next visit --Has started working with trainer; goal 150min per week  #Snoring  -- Needs outpatient sleep study; currently being organized by PCP   Follow-up in 3 months.  Medication Adjustments/Labs and Tests Ordered: Current medicines are reviewed at length with the patient today.  Concerns regarding medicines are outlined above.  Orders Placed This Encounter  Procedures  . Basic metabolic panel  . Magnesium  . AMB referral to cardiac rehabilitation  . Ambulatory referral to Cardiac Electrophysiology  . EKG 12-Lead  . ECHOCARDIOGRAM COMPLETE   Meds ordered this encounter   Medications  . sacubitril-valsartan (ENTRESTO) 49-51 MG    Sig: Take 1 tablet by mouth 2 (two) times daily.    Dispense:  60 tablet    Refill:  3    Dose increase  . dapagliflozin propanediol (FARXIGA) 10 MG TABS tablet    Sig: Take 1 tablet (10 mg total) by mouth daily before breakfast.    Dispense:  30 tablet    Refill:  6    Patient Instructions  Medication Instructions:   START TAKING FARXIGA  10 MG BY MOUTH DAILY BEFORE BREAKFAST  INCREASE YOUR ENTRESTO TO 49/51 MG BY MOUTH TWICE DAILY  *If you need a refill on your cardiac medications before your next appointment, please call your pharmacy*   Lab Work:  IN ONE WEEK--CHECK BMET AND MAGNESIUM LEVEL  If you have labs (blood work) drawn today and your tests are completely normal, you will receive your results only by: [name] MyChart Message (if you have MyChart) OR . A paper copy in the mail If you have any lab test that is abnormal or we need to change your treatment, we will call you to review the results.   You have been referred to CARDIAC REHAB AT [facility]  You have been referred to OUR ELECTROPHYSIOLOGIST HERE IN THE OFFICE FOR MANAGEMENT OF YOUR AFIB AND BEING ON TIKOSYN   Testing/Procedures:  Your physician has requested that you have an echocardiogram. Echocardiography is a painless test that uses sound waves to create images of your heart. It provides your doctor with information about the size and shape of your heart and how well your heart's chambers and valves are working. This procedure takes approximately one hour. There are no restrictions for this procedure. ECHO SHOULD BE SCHEDULED IN EARLY July PER DR. Shari Prows, AND PRIOR TO HIS OFFICE VISIT APPOINTMENT WITH HER IN JULY   Follow-Up:  IN July WITH DR. Shari Prows IN THE CLINIC--PLEASE HAVE YOUR ECHO DONE PRIOR TO THIS APPOINTMENT       I,Mathew Stumpf,acting as a scribe for Meriam Sprague, MD.,have documented all relevant  documentation on the behalf of Meriam Sprague, MD,as directed by  Meriam Sprague, MD while in the presence of Meriam Sprague, MD.  I, Meriam Sprague, MD, have reviewed all documentation for this visit. The documentation on 02/25/21 for the exam, diagnosis, procedures, and orders are all accurate and complete.  Signed, Meriam Sprague, MD  02/25/2021 9:34 AM    [facility]

## 2021-03-03 ENCOUNTER — Encounter: Payer: Self-pay | Admitting: Family Medicine

## 2021-03-03 ENCOUNTER — Other Ambulatory Visit: Payer: Self-pay

## 2021-03-03 MED ORDER — DOFETILIDE 500 MCG PO CAPS: 500.0000 ug | ORAL_CAPSULE | Freq: Two times a day (BID) | ORAL | 3 refills | Status: DC

## 2021-03-03 NOTE — Telephone Encounter (Signed)
Pt's medication was sent to pt's pharmacy as requested. Confirmation received.  °

## 2021-03-04 ENCOUNTER — Other Ambulatory Visit: Payer: Self-pay

## 2021-03-04 ENCOUNTER — Other Ambulatory Visit: Payer: Commercial Managed Care - PPO | Admitting: *Deleted

## 2021-03-04 DIAGNOSIS — Z79899 Other long term (current) drug therapy: Secondary | ICD-10-CM

## 2021-03-04 DIAGNOSIS — I10 Essential (primary) hypertension: Secondary | ICD-10-CM

## 2021-03-04 DIAGNOSIS — I5042 Chronic combined systolic (congestive) and diastolic (congestive) heart failure: Secondary | ICD-10-CM

## 2021-03-04 DIAGNOSIS — I4819 Other persistent atrial fibrillation: Secondary | ICD-10-CM

## 2021-03-05 ENCOUNTER — Encounter: Payer: Self-pay | Admitting: Family Medicine

## 2021-03-05 ENCOUNTER — Ambulatory Visit (INDEPENDENT_AMBULATORY_CARE_PROVIDER_SITE_OTHER): Payer: Commercial Managed Care - PPO | Admitting: Family Medicine

## 2021-03-05 VITALS — BP 129/51 | HR 67 | Ht 69.0 in | Wt 319.0 lb

## 2021-03-05 DIAGNOSIS — I4891 Unspecified atrial fibrillation: Secondary | ICD-10-CM | POA: Diagnosis not present

## 2021-03-05 DIAGNOSIS — I5041 Acute combined systolic (congestive) and diastolic (congestive) heart failure: Secondary | ICD-10-CM | POA: Diagnosis not present

## 2021-03-05 DIAGNOSIS — I10 Essential (primary) hypertension: Secondary | ICD-10-CM

## 2021-03-05 DIAGNOSIS — R0683 Snoring: Secondary | ICD-10-CM | POA: Diagnosis not present

## 2021-03-05 LAB — BASIC METABOLIC PANEL
BUN/Creatinine Ratio: 22 — ABNORMAL HIGH (ref 9–20)
BUN: 28 mg/dL — ABNORMAL HIGH (ref 6–24)
CO2: 22 mmol/L (ref 20–29)
Calcium: 9.7 mg/dL (ref 8.7–10.2)
Chloride: 97 mmol/L (ref 96–106)
Creatinine, Ser: 1.27 mg/dL (ref 0.76–1.27)
Glucose: 96 mg/dL (ref 65–99)
Potassium: 4.7 mmol/L (ref 3.5–5.2)
Sodium: 135 mmol/L (ref 134–144)
eGFR: 66 mL/min/{1.73_m2} (ref >59)

## 2021-03-05 LAB — MAGNESIUM: Magnesium: 2.5 mg/dL — ABNORMAL HIGH (ref 1.6–2.3)

## 2021-03-05 NOTE — Progress Notes (Signed)
 Established Patient Office Visit  Subjective:  Patient ID: Joe Morrison, male    DOB: 08/07/1966  Age: 55 y.o. MRN: [id number]  CC:  Chief Complaint  Patient presents with  . Hospitalization Follow-up    HPI Joe Morrison presents for   Hypertension- Pt denies chest pain, SOB, dizziness, or heart palpitations.  Taking meds as directed w/o problems.  Denies medication side effects.    F/U Heart failure.  Farxiga was recently added to his regimen.  He is doing well.  He has noticed his diastolic pressures have been a little bit lower.  He is actually only using the torsemide as needed which on average is about 2-3 times per week.  He has been really trying to eat a low-sodium diet.  He finally heard back about scheduling the sleep study yesterday.  We had put the referral in almost a month ago.  He just needs to call them back and get that scheduled.  He was having significant sleep difficulty when I last saw him he really feels like it was due to volume overload it was actually before he was started on torsemide and says since being on that it is made a huge difference and he is actually sleeping well and tolerating the Tikosyn well.  Past Medical History:  Diagnosis Date  . Alcoholic (HCC)    recovering since 10/1999  . Hypertension     Past Surgical History:  Procedure Laterality Date  . CARDIOVERSION N/A 01/29/2021   Procedure: CARDIOVERSION;  Surgeon: Nishan, Peter C, MD;  Location: [facility] ENDOSCOPY;  Service: Cardiovascular;  Laterality: N/A;  . MOUTH SURGERY     Dental work  . TEE WITHOUT CARDIOVERSION N/A 01/29/2021   Procedure: TRANSESOPHAGEAL ECHOCARDIOGRAM (TEE);  Surgeon: Nishan, Peter C, MD;  Location: [facility] ENDOSCOPY;  Service: Cardiovascular;  Laterality: N/A;  . TONSILLECTOMY  1972    Family History  Problem Relation Age of Onset  . Diabetes Mother   . Hypertension Mother   . Heart attack Father 53  . Alcohol abuse Other     Social History    Socioeconomic History  . Marital status: Single    Spouse name: Not on file  . Number of children: 0  . Years of education: Not on file  . Highest education level: Some college, no degree  Occupational History  . Occupation: Chief operating officer  Tobacco Use  . Smoking status: Former Smoker    Packs/day: 2.00    Years: 16.00    Pack years: 32.00    Types: Cigarettes    Quit date: 04/27/1999    Years since quitting: 21.8  . Smokeless tobacco: Never Used  Vaping Use  . Vaping Use: Never used  Substance and Sexual Activity  . Alcohol use: No    Comment: Former alcoholic-Quit in 10/1999. former heavy drinker 20beers/night.  . Drug use: Not Currently    Types: Marijuana, MDMA (Ecstacy), Oxycodone    Comment: stopped in 2001  . Sexual activity: Not Currently    Partners: Male    Birth control/protection: None  Other Topics Concern  . Not on file  Social History Narrative   Working out daily with a trainer. No partner   Social Determinants of Health   Financial Resource Strain: Low Risk   . Difficulty of Paying Living Expenses: Not hard at all  Food Insecurity: No Food Insecurity  . Worried About Running Out of Food in the Last Year: Never true  . Ran Out of   Food in the Last Year: Never true  Transportation Needs: No Transportation Needs  . Lack of Transportation (Medical): No  . Lack of Transportation (Non-Medical): No  Physical Activity: Sufficiently Active  . Days of Exercise per Week: 3 days  . Minutes of Exercise per Session: 60 min  Stress: Stress Concern Present  . Feeling of Stress : Rather much  Social Connections: Not on file  Intimate Partner Violence: Not on file    Outpatient Medications Prior to Visit  Medication Sig Dispense Refill  . apixaban (ELIQUIS) 5 MG TABS tablet Take 1 tablet (5 mg total) by mouth 2 (two) times daily. 60 tablet 1  . dapagliflozin propanediol (FARXIGA) 10 MG TABS tablet Take 1 tablet (10 mg total) by mouth daily before  breakfast. 30 tablet 6  . dofetilide (TIKOSYN) 500 MCG capsule Take 1 capsule (500 mcg total) by mouth 2 (two) times daily. 180 capsule 3  . metoprolol succinate (TOPROL-XL) 100 MG 24 hr tablet Take 1 tablet (100 mg total) by mouth daily. Take with or immediately following a meal. 90 tablet 3  . Multiple Vitamin (MULTIVITAMIN WITH MINERALS) TABS tablet Take 1 tablet by mouth daily.    . sacubitril-valsartan (ENTRESTO) 49-51 MG Take 1 tablet by mouth 2 (two) times daily. 60 tablet 3  . spironolactone (ALDACTONE) 25 MG tablet Take 1/2 tablets (12.5 mg total) by mouth daily. 45 tablet 3  . torsemide (DEMADEX) 20 MG tablet Take 1-2 tablets (20-40 mg total) by mouth daily as needed. 30 tablet 0  . Multiple Vitamins-Minerals (AIRBORNE PO) Take 1 packet by mouth daily.     No facility-administered medications prior to visit.    No Known Allergies  ROS Review of Systems    Objective:    Physical Exam Constitutional:      Appearance: He is well-developed.  HENT:     Head: Normocephalic and atraumatic.  Cardiovascular:     Rate and Rhythm: Normal rate and regular rhythm.     Heart sounds: Normal heart sounds.  Pulmonary:     Effort: Pulmonary effort is normal.     Breath sounds: Normal breath sounds.  Skin:    General: Skin is warm and dry.  Neurological:     Mental Status: He is alert and oriented to person, place, and time.  Psychiatric:        Behavior: Behavior normal.     BP (!) 129/51   Pulse 67   Ht 5' 9" (1.753 m)   Wt (!) 319 lb (144.7 kg)   SpO2 97%   BMI 47.11 kg/m  Wt Readings from Last 3 Encounters:  03/05/21 (!) 319 lb (144.7 kg)  02/25/21 (!) 318 lb 6.4 oz (144.4 kg)  02/18/21 (!) 323 lb 12.8 oz (146.9 kg)     Health Maintenance Due  Topic Date Due  . Hepatitis C Screening  Never done  . COLONOSCOPY (Pts 45-49yrs Insurance coverage will need to be confirmed)  Never done  . COVID-19 Vaccine (3 - Booster for Moderna series) 06/12/2020    There are no  preventive care reminders to display for this patient.  Lab Results  Component Value Date   TSH 2.382 01/28/2021   Lab Results  Component Value Date   WBC 7.4 01/30/2021   HGB 13.7 01/30/2021   HCT 42.7 01/30/2021   MCV 94.1 01/30/2021   PLT 185 01/30/2021   Lab Results  Component Value Date   NA 135 03/04/2021   K 4.7 03/04/2021     CO2 22 03/04/2021   GLUCOSE 96 03/04/2021   BUN 28 (H) 03/04/2021   CREATININE 1.27 03/04/2021   BILITOT 0.5 01/27/2021   ALKPHOS 48 01/27/2021   AST 25 01/27/2021   ALT 34 01/27/2021   PROT 7.7 01/27/2021   ALBUMIN 4.2 01/27/2021   CALCIUM 9.7 03/04/2021   ANIONGAP 9 02/14/2021   EGFR 66 03/04/2021   Lab Results  Component Value Date   CHOL 153 01/28/2021   Lab Results  Component Value Date   HDL 28 (L) 01/28/2021   Lab Results  Component Value Date   LDLCALC 92 01/28/2021   Lab Results  Component Value Date   TRIG 165 (H) 01/28/2021   Lab Results  Component Value Date   CHOLHDL 5.5 01/28/2021   Lab Results  Component Value Date   HGBA1C 4.7 (L) 01/28/2021      Assessment & Plan:   Problem List Items Addressed This Visit      Cardiovascular and Mediastinum   HYPERTENSION, BENIGN SYSTEMIC    Well controlled. Continue current regimen. Follow up in  6 mo       Atrial fibrillation with RVR (HCC)    Continue Eliquis.      Acute combined systolic and diastolic heart failure ([place])    Doing well on Quadruple therapy.  ON BB, Entresto, Farxiga, and spironolactone.  He wanted to know how long he would be on these medications and we had a long discussion about the more chronic nature of this diagnosis.  But that often times on the appropriate therapy ejection fraction can actually improve over time along with healthy diet regular exercise and weight loss.  He still working with a Physiological scientist and is doing well with that.        Other   Snoring - Primary    Sleep department finally reached out to him to get him  scheduled.  Hopefully he can get that done in the next couple of months.         No orders of the defined types were placed in this encounter.   Follow-up: No follow-ups on file.    I spent 35 minutes on the day of the encounter to include pre-visit record review, face-to-face time with the patient and post visit ordering of test.  Beatrice Lecher, MD

## 2021-03-05 NOTE — Assessment & Plan Note (Signed)
Sleep department finally reached out to him to get him scheduled.  Hopefully he can get that done in the next couple of months.

## 2021-03-05 NOTE — Assessment & Plan Note (Signed)
Well controlled. Continue current regimen. Follow up in  6 mo  

## 2021-03-05 NOTE — Assessment & Plan Note (Addendum)
Doing well on Quadruple therapy.  ON BB, Entresto, Farxiga, and spironolactone.  He wanted to know how long he would be on these medications and we had a long discussion about the more chronic nature of this diagnosis.  But that often times on the appropriate therapy ejection fraction can actually improve over time along with healthy diet regular exercise and weight loss.  He still working with a Systems analyst and is doing well with that.

## 2021-03-05 NOTE — Assessment & Plan Note (Signed)
-   Continue Eliquis 

## 2021-03-09 ENCOUNTER — Other Ambulatory Visit (HOSPITAL_COMMUNITY): Payer: Self-pay | Admitting: Internal Medicine

## 2021-03-09 DIAGNOSIS — I5042 Chronic combined systolic (congestive) and diastolic (congestive) heart failure: Secondary | ICD-10-CM

## 2021-03-13 ENCOUNTER — Encounter (HOSPITAL_COMMUNITY): Payer: Self-pay

## 2021-03-13 NOTE — Telephone Encounter (Signed)
Attempted to call patient in regards to Cardiac Rehab - LM on VM Mailed letter. Going to ask pt if he would prefer to come to [facility] for cardiac rehab or [place] cardiac rehab, since he lives in the [place].

## 2021-03-14 NOTE — Telephone Encounter (Signed)
Pt called and stated he would like to attend CR in [place]. Will close this referral and fax referral to [place].

## 2021-03-16 ENCOUNTER — Other Ambulatory Visit: Payer: Self-pay | Admitting: Family Medicine

## 2021-03-16 DIAGNOSIS — I10 Essential (primary) hypertension: Secondary | ICD-10-CM

## 2021-03-17 ENCOUNTER — Ambulatory Visit (INDEPENDENT_AMBULATORY_CARE_PROVIDER_SITE_OTHER): Payer: Commercial Managed Care - PPO | Admitting: Student

## 2021-03-17 ENCOUNTER — Encounter: Payer: Self-pay | Admitting: Student

## 2021-03-17 ENCOUNTER — Other Ambulatory Visit: Payer: Self-pay

## 2021-03-17 VITALS — BP 130/62 | HR 64 | Ht 69.0 in | Wt 311.0 lb

## 2021-03-17 DIAGNOSIS — I5042 Chronic combined systolic (congestive) and diastolic (congestive) heart failure: Secondary | ICD-10-CM

## 2021-03-17 DIAGNOSIS — I4819 Other persistent atrial fibrillation: Secondary | ICD-10-CM

## 2021-03-17 DIAGNOSIS — I472 Ventricular tachycardia: Secondary | ICD-10-CM | POA: Diagnosis not present

## 2021-03-17 DIAGNOSIS — I4729 Other ventricular tachycardia: Secondary | ICD-10-CM

## 2021-03-17 LAB — BASIC METABOLIC PANEL
BUN/Creatinine Ratio: 15 (ref 9–20)
BUN: 20 mg/dL (ref 6–24)
CO2: 27 mmol/L (ref 20–29)
Calcium: 9.9 mg/dL (ref 8.7–10.2)
Chloride: 102 mmol/L (ref 96–106)
Creatinine, Ser: 1.3 mg/dL — ABNORMAL HIGH (ref 0.76–1.27)
Glucose: 93 mg/dL (ref 65–99)
Potassium: 5.2 mmol/L (ref 3.5–5.2)
Sodium: 139 mmol/L (ref 134–144)
eGFR: 64 mL/min/{1.73_m2} (ref >59)

## 2021-03-17 LAB — MAGNESIUM: Magnesium: 2.3 mg/dL (ref 1.6–2.3)

## 2021-03-17 NOTE — Progress Notes (Signed)
PCP:  Agapito Games, MD Primary Cardiologist: Meriam Sprague, MD Electrophysiologist: Lewayne Bunting, MD    Joe Morrison is a 55 y.o. male seen today for Lewayne Bunting, MD for routine electrophysiology followup.  Since discharge from hospital the patient reports doing very well on Tikosyn. "I haven't felt this good in a long time". he denies chest pain, palpitations, dyspnea, PND, orthopnea, nausea, vomiting, dizziness, syncope, edema, weight gain, or early satiety.  Past Medical History:  Diagnosis Date  . Alcoholic (HCC)    recovering since 10/1999  . Hypertension    Past Surgical History:  Procedure Laterality Date  . CARDIOVERSION N/A 01/29/2021   Procedure: CARDIOVERSION;  Surgeon: Wendall Stade, MD;  Location: [facility] ENDOSCOPY;  Service: Cardiovascular;  Laterality: N/A;  . MOUTH SURGERY     Dental work  . TEE WITHOUT CARDIOVERSION N/A 01/29/2021   Procedure: TRANSESOPHAGEAL ECHOCARDIOGRAM (TEE);  Surgeon: Wendall Stade, MD;  Location[facility] ENDOSCOPY;  Service: Cardiovascular;  Laterality: N/A;  . TONSILLECTOMY  1972    Current Outpatient Medications  Medication Sig Dispense Refill  . apixaban (ELIQUIS) 5 MG TABS tablet Take 1 tablet (5 mg total) by mouth 2 (two) times daily. 60 tablet 1  . dapagliflozin propanediol (FARXIGA) 10 MG TABS tablet Take 1 tablet (10 mg total) by mouth daily before breakfast. 30 tablet 6  . dofetilide (TIKOSYN) 500 MCG capsule Take 1 capsule (500 mcg total) by mouth 2 (two) times daily. 180 capsule 3  . metoprolol succinate (TOPROL-XL) 100 MG 24 hr tablet Take 1 tablet (100 mg total) by mouth daily. Take with or immediately following a meal. 90 tablet 3  . Multiple Vitamin (MULTIVITAMIN WITH MINERALS) TABS tablet Take 1 tablet by mouth daily.    . Multiple Vitamins-Minerals (AIRBORNE PO) Take 1 packet by mouth daily.    . sacubitril-valsartan (ENTRESTO) 49-51 MG Take 1 tablet by mouth 2 (two) times daily. 60 tablet 3  . spironolactone  (ALDACTONE) 25 MG tablet Take 1/2 tablets (12.5 mg total) by mouth daily. 45 tablet 3  . torsemide (DEMADEX) 20 MG tablet TAKE 1 TO 2 TABLETS(20 TO 40 MG) BY MOUTH DAILY AS NEEDED 30 tablet 6   No current facility-administered medications for this visit.    No Known Allergies  Social History   Socioeconomic History  . Marital status: Single    Spouse name: Not on file  . Number of children: 0  . Years of education: Not on file  . Highest education level: Some college, no degree  Occupational History  . Occupation: Engineer, drilling  Tobacco Use  . Smoking status: Former Smoker    Packs/day: 2.00    Years: 16.00    Pack years: 32.00    Types: Cigarettes    Quit date: 04/27/1999    Years since quitting: 21.9  . Smokeless tobacco: Never Used  Vaping Use  . Vaping Use: Never used  Substance and Sexual Activity  . Alcohol use: No    Comment: Former Advice worker in 10/6107. former heavy drinker 20beers/night.  . Drug use: Not Currently    Types: Marijuana, MDMA (Ecstacy), Oxycodone    Comment: stopped in 2001  . Sexual activity: Not Currently    Partners: Male    Birth control/protection: None  Other Topics Concern  . Not on file  Social History Narrative   Working out daily with a Psychologist, educational. No partner   Social Determinants of Health   Financial Resource Strain: Low Risk   .  Difficulty of Paying Living Expenses: Not hard at all  Food Insecurity: No Food Insecurity  . Worried About Programme researcher, broadcasting/film/video in the Last Year: Never true  . Ran Out of Food in the Last Year: Never true  Transportation Needs: No Transportation Needs  . Lack of Transportation (Medical): No  . Lack of Transportation (Non-Medical): No  Physical Activity: Sufficiently Active  . Days of Exercise per Week: 3 days  . Minutes of Exercise per Session: 60 min  Stress: Stress Concern Present  . Feeling of Stress : Rather much  Social Connections: Not on file  Intimate Partner Violence: Not on file      Review of Systems: General: No chills, fever, night sweats or weight changes  Cardiovascular:  No chest pain, dyspnea on exertion, edema, orthopnea, palpitations, paroxysmal nocturnal dyspnea Dermatological: No rash, lesions or masses Respiratory: No cough, dyspnea Urologic: No hematuria, dysuria Abdominal: No nausea, vomiting, diarrhea, bright red blood per rectum, melena, or hematemesis Neurologic: No visual changes, weakness, changes in mental status All other systems reviewed and are otherwise negative except as noted above.  Physical Exam: There were no vitals filed for this visit.  GEN- The patient is well appearing, alert and oriented x 3 today.   HEENT: normocephalic, atraumatic; sclera clear, conjunctiva pink; hearing intact; oropharynx clear; neck supple, no JVP Lymph- no cervical lymphadenopathy Lungs- Clear to ausculation bilaterally, normal work of breathing.  No wheezes, rales, rhonchi Heart- Regular rate and rhythm, no murmurs, rubs or gallops, PMI not laterally displaced GI- soft, non-tender, non-distended, bowel sounds present, no hepatosplenomegaly Extremities- no clubbing, cyanosis, or edema; DP/PT/radial pulses 2+ bilaterally MS- no significant deformity or atrophy Skin- warm and dry, no rash or lesion Psych- euthymic mood, full affect Neuro- strength and sensation are intact  EKG is ordered. Personal review of EKG from today shows NSR at 64 bpm with normal intervals. QTc ~480 or better.  Additional studies reviewed include: Recent EP admission notes for tikosyn loading  Assessment and Plan:  1. 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 Keep K > 4.0 and Mg > 2.0   3. Chronic systolic CHF Dr. Shari Prows following. Med titration on-going and echo update next month.  RTC 6 months. Sooner with issues  Graciella Freer, PA-C  03/17/21 8:11 AM

## 2021-03-17 NOTE — Patient Instructions (Signed)
Medication Instructions:  Your physician recommends that you continue on your current medications as directed. Please refer to the Current Medication list given to you today.  *If you need a refill on your cardiac medications before your next appointment, please call your pharmacy*   Lab Work: TODAY: BMET, Mg If you have labs (blood work) drawn today and your tests are completely normal, you will receive your results only by: [name] MyChart Message (if you have MyChart) OR . A paper copy in the mail If you have any lab test that is abnormal or we need to change your treatment, we will call you to review the results.   Follow-Up: At [facility], you and your health needs are our priority.  As part of our continuing mission to provide you with exceptional heart care, we have created designated Provider Care Teams.  These Care Teams include your primary Cardiologist (physician) and Advanced Practice Providers (APPs -  Physician Assistants and Nurse Practitioners) who all work together to provide you with the care you need, when you need it.     Your next appointment:   6 month(s)  The format for your next appointment:   In Person  Provider:   You may see Lewayne Bunting, MD or one of the following Advanced Practice Providers on your designated Care Team:    Francis Dowse, [place] "[facility]" [place], [place]

## 2021-03-19 ENCOUNTER — Other Ambulatory Visit: Payer: Self-pay

## 2021-03-19 DIAGNOSIS — I5042 Chronic combined systolic (congestive) and diastolic (congestive) heart failure: Secondary | ICD-10-CM

## 2021-03-24 ENCOUNTER — Other Ambulatory Visit (HOSPITAL_COMMUNITY): Payer: Self-pay | Admitting: Internal Medicine

## 2021-03-24 DIAGNOSIS — I5042 Chronic combined systolic (congestive) and diastolic (congestive) heart failure: Secondary | ICD-10-CM

## 2021-03-30 ENCOUNTER — Other Ambulatory Visit: Payer: Self-pay | Admitting: Cardiology

## 2021-03-31 ENCOUNTER — Other Ambulatory Visit: Payer: Self-pay | Admitting: Cardiology

## 2021-03-31 NOTE — Telephone Encounter (Signed)
Pt last saw Maxine Glenn, [place] on 03/17/21, last labs 03/17/21 Creat 1.30, age 55, weight 141.1kg, based on specified criteria pt is on appropriate dosage of Eliquis 5mg  BID.  Will refill rx.

## 2021-04-02 ENCOUNTER — Other Ambulatory Visit: Payer: Self-pay | Admitting: Cardiology

## 2021-04-21 ENCOUNTER — Other Ambulatory Visit: Payer: Self-pay

## 2021-04-21 ENCOUNTER — Ambulatory Visit (HOSPITAL_COMMUNITY): Payer: Commercial Managed Care - PPO | Attending: Cardiology

## 2021-04-21 ENCOUNTER — Other Ambulatory Visit: Payer: Commercial Managed Care - PPO

## 2021-04-21 ENCOUNTER — Telehealth: Payer: Self-pay

## 2021-04-21 DIAGNOSIS — I4819 Other persistent atrial fibrillation: Secondary | ICD-10-CM | POA: Diagnosis present

## 2021-04-21 DIAGNOSIS — I5042 Chronic combined systolic (congestive) and diastolic (congestive) heart failure: Secondary | ICD-10-CM | POA: Insufficient documentation

## 2021-04-21 LAB — BASIC METABOLIC PANEL
BUN/Creatinine Ratio: 19 (ref 9–20)
BUN: 19 mg/dL (ref 6–24)
CO2: 20 mmol/L (ref 20–29)
Calcium: 9.7 mg/dL (ref 8.7–10.2)
Chloride: 107 mmol/L — ABNORMAL HIGH (ref 96–106)
Creatinine, Ser: 1.02 mg/dL (ref 0.76–1.27)
Glucose: 92 mg/dL (ref 65–99)
Potassium: 4.6 mmol/L (ref 3.5–5.2)
Sodium: 140 mmol/L (ref 134–144)
eGFR: 86 mL/min/{1.73_m2} (ref >59)

## 2021-04-21 LAB — ECHOCARDIOGRAM COMPLETE
Area-P 1/2: 2.95 cm2
S' Lateral: 4.2 cm

## 2021-04-21 MED ORDER — PERFLUTREN LIPID MICROSPHERE
1.0000 mL | INTRAVENOUS | Status: AC | PRN
  Administered 2021-04-21: 3 mL via INTRAVENOUS

## 2021-04-21 NOTE — Telephone Encounter (Signed)
Transition Care Management Follow-up Telephone Call Date of discharge and from where: 04/18/2021 from [facility] How have you been since you were released from the hospital? Pt stated that he is feeling much better. Pt stated that he did not have any questions.  Any questions or concerns? No  Items Reviewed: Did the pt receive and understand the discharge instructions provided? Yes  Medications obtained and verified? Yes  Other? No  Any new allergies since your discharge? No  Dietary orders reviewed? No Do you have support at home? Yes   Functional Questionnaire: (I = Independent and D = Dependent) ADLs: I  Bathing/Dressing- I  Meal Prep- I  Eating- I  Maintaining continence- I  Transferring/Ambulation- I  Managing Meds- I   Follow up appointments reviewed:  PCP Hospital f/u appt confirmed? No   Specialist Hospital f/u appt confirmed? Yes  Scheduled to see Laurance Flatten, MD on 04/28/2021 @ 11:40 am. Are transportation arrangements needed? No  If their condition worsens, is the pt aware to call PCP or go to the Emergency Dept.? Yes Was the patient provided with contact information for the PCP's office or ED? Yes Was to pt encouraged to call back with questions or concerns? Yes

## 2021-04-22 ENCOUNTER — Telehealth: Payer: Self-pay | Admitting: *Deleted

## 2021-04-22 DIAGNOSIS — I5042 Chronic combined systolic (congestive) and diastolic (congestive) heart failure: Secondary | ICD-10-CM

## 2021-04-22 DIAGNOSIS — I472 Ventricular tachycardia: Secondary | ICD-10-CM

## 2021-04-22 DIAGNOSIS — I4819 Other persistent atrial fibrillation: Secondary | ICD-10-CM

## 2021-04-22 DIAGNOSIS — I4729 Other ventricular tachycardia: Secondary | ICD-10-CM

## 2021-04-22 DIAGNOSIS — I351 Nonrheumatic aortic (valve) insufficiency: Secondary | ICD-10-CM

## 2021-04-22 NOTE — Telephone Encounter (Signed)
Pt made aware of echo results and recommendations per Dr. Shari Prows. Pt states he is not super symptomatic, has no increased weight gain or lower extremity edema.  He states he does exercise daily, and at times does get sob with exertion. Pt states he is being very mindful of his diet and sodium intake.  He does take his diuretic as prescribed, and states that helps a lot.  He denies any chest pain, palpitations, pre-syncope or syncope. Pt states he would still like to proceed with getting the cardiac MRI done, to better assess his AR.  He states he would feel better having full clarity and answers regarding this issue, and being at times he does get sob when exerting.  Informed the pt that I will go ahead and place the order for him to get the Cardiac MRI in the sytem, and send our MRI scheduler a message to call him back once pre-certed, to arrange.  Advised him that we will see him as planned on next Monday 7/18 in clinic.  Pt verbalized understanding and agrees with this plan. Will send this message back to Dr. Shari Prows as an Lorain Childes, to make her aware that pt would like to proceed with getting the Cardiac MRI done.

## 2021-04-22 NOTE — Telephone Encounter (Signed)
-----   Message from Meriam Sprague, MD sent at 04/21/2021  8:47 PM EDT ----- His echo shows that his EF is improved to 45-50%. 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 How is he feeling? If he is having any HF symptoms, we may need to consider MRI to better quantify the AR and consider work-up for AVR. IF he is doing overall well, we can repeat TTE in 45months

## 2021-04-23 NOTE — Progress Notes (Signed)
Cardiology Office Note:    Date:  04/28/2021   ID:  Joe Morrison, DOB [birth year], MRN [id number]  PCP:  Agapito Games, MD   [facility] Providers Cardiologist:  Meriam Sprague, MD Electrophysiologist:  Lewayne Bunting, MD  {    Referring MD: Agapito Games, *    History of Present Illness:    Joe Morrison is a 55 y.o. male with a hx of newly diagnosed atrial fibrillation with failed DCCV now s/p tikosyn load, newly diagnosed combined systolic and diastolic heart failure, hypertension, severe aortic regurgitation, and morbid obesity who presents to clinic for follow-up.  Patient was admitted from 01/27/21-02/01/21 where he presented with worsening shortness of breath and volume overload found to have new onest atrial fibrillation with RVR as well was new acute systolic and diastolic heart failure with LVEF 35-40%. BNP on admission 475. He was diuresed with improvement and underwent DCCV, however, failed to return to NSR. EP was consulted and he subsequently underwent tikosyn load with successful return to NSR. He now presents to clinic for follow-up.   Last seen in clinic on 02/25/21 where he was clinically improving. No HF or anginal symptoms. TTE 04/21/21 showed LVEF 45-50%, moderately dilated, mild LVH, severe LAE, at least moderate AR, ascending aorta 42mm. Recommended for CMR which is now pending.  Today, the patient states that he overall feels well. Has had episodes of anxiety where he feels like he is struggling to breathe and is afraid he is going into Afib. Went to ER once due to panic attack but overall is improving. Otherwise, has been doing well when working out with his trainor. No chest pain, SOB, dyspnea on exertion or palpitations. Blood pressures well controlled at home 120/80s. Only taking torsemide 1-2x/week. Very excited to be going on vacation soon.   Past Medical History:  Diagnosis Date   Alcoholic (HCC)    recovering since 10/1999    Hypertension     Past Surgical History:  Procedure Laterality Date   CARDIOVERSION N/A 01/29/2021   Procedure: CARDIOVERSION;  Surgeon: Wendall Stade, MD;  Location: [facility] ENDOSCOPY;  Service: Cardiovascular;  Laterality: N/A;   MOUTH SURGERY     Dental work   TEE WITHOUT CARDIOVERSION N/A 01/29/2021   Procedure: TRANSESOPHAGEAL ECHOCARDIOGRAM (TEE);  Surgeon: Wendall Stade, MD;  Location: [facility] ENDOSCOPY;  Service: Cardiovascular;  Laterality: N/A;   TONSILLECTOMY  1972    Current Medications: Current Meds  Medication Sig   dapagliflozin propanediol (FARXIGA) 10 MG TABS tablet Take 1 tablet (10 mg total) by mouth daily before breakfast.   dofetilide (TIKOSYN) 500 MCG capsule TAKE 1 CAPSULE(500 MCG) BY MOUTH TWICE DAILY   Multiple Vitamin (MULTIVITAMIN WITH MINERALS) TABS tablet Take 1 tablet by mouth daily.   Multiple Vitamins-Minerals (AIRBORNE PO) Take 1 packet by mouth daily.   sacubitril-valsartan (ENTRESTO) 97-103 MG Take 1 tablet by mouth 2 (two) times daily.   torsemide (DEMADEX) 20 MG tablet TAKE 1 TO 2 TABLETS(20 TO 40 MG) BY MOUTH DAILY AS NEEDED   [DISCONTINUED] apixaban (ELIQUIS) 5 MG TABS tablet TAKE 1 TABLET(5 MG) BY MOUTH TWICE DAILY   [DISCONTINUED] metoprolol succinate (TOPROL-XL) 100 MG 24 hr tablet Take 1 tablet (100 mg total) by mouth daily. Take with or immediately following a meal.   [DISCONTINUED] sacubitril-valsartan (ENTRESTO) 49-51 MG Take 1 tablet by mouth 2 (two) times daily.   [DISCONTINUED] spironolactone (ALDACTONE) 25 MG tablet Take 1/2 tablets (12.5 mg total) by mouth daily.  Allergies:   Patient has no known allergies.   Social History   Socioeconomic History   Marital status: Single    Spouse name: Not on file   Number of children: 0   Years of education: Not on file   Highest education level: Some college, no degree  Occupational History   Occupation: Engineer, drillingChief operating officer  Tobacco Use   Smoking status: Former    Packs/day: 2.00     Years: 16.00    Pack years: 32.00    Types: Cigarettes    Quit date: 04/27/1999    Years since quitting: 22.0   Smokeless tobacco: Never  Vaping Use   Vaping Use: Never used  Substance and Sexual Activity   Alcohol use: No    Comment: Former Advice workeralcoholic-Quit in 1/61091/2001. former heavy drinker 20beers/night.   Drug use: Not Currently    Types: Marijuana, MDMA (Ecstacy), Oxycodone    Comment: stopped in 2001   Sexual activity: Not Currently    Partners: Male    Birth control/protection: None  Other Topics Concern   Not on file  Social History Narrative   Working out daily with a Psychologist, educationaltrainer. No partner   Social Determinants of Corporate investment bankerHealth   Financial Resource Strain: Low Risk    Difficulty of Paying Living Expenses: Not hard at all  Food Insecurity: No Food Insecurity   Worried About Programme researcher, broadcasting/film/videounning Out of Food in the Last Year: Never true   Baristaan Out of Food in the Last Year: Never true  Transportation Needs: No Transportation Needs   Lack of Transportation (Medical): No   Lack of Transportation (Non-Medical): No  Physical Activity: Sufficiently Active   Days of Exercise per Week: 3 days   Minutes of Exercise per Session: 60 min  Stress: Stress Concern Present   Feeling of Stress : Rather much  Social Connections: Not on file     Family History: The patient's family history includes Alcohol abuse in an other family member; Diabetes in his mother; Heart attack (age of onset: 6153) in his father; Hypertension in his mother.  ROS:   Please see the history of present illness.    Review of Systems  Constitutional:  Negative for diaphoresis and malaise/fatigue.  HENT:  Negative for hearing loss, nosebleeds and sore throat.   Eyes:  Negative for blurred vision and double vision.  Respiratory:  Negative for sputum production, shortness of breath and wheezing.   Cardiovascular:  Negative for chest pain, palpitations, orthopnea, claudication, leg swelling and PND.  Gastrointestinal:  Negative for  heartburn, nausea and vomiting.  Genitourinary:  Negative for dysuria and hematuria.  Musculoskeletal:  Negative for falls and joint pain.  Neurological:  Negative for dizziness, seizures, loss of consciousness and headaches.  Endo/Heme/Allergies:  Does not bruise/bleed easily.  Psychiatric/Behavioral:  Negative for depression and memory loss. The patient is nervous/anxious.     EKGs/Labs/Other Studies Reviewed:    The following studies were reviewed today: TTE 04/21/21: IMPRESSIONS   1. Patient in NSR compared to recent TEE done 01/29/21.   2. Apical hypokinesis . Left ventricular ejection fraction, by  estimation, is 45 to 50%. The left ventricle has mildly decreased  function. The left ventricle demonstrates regional wall motion  abnormalities (see scoring diagram/findings for description).   The left ventricular internal cavity size was moderately dilated. There  is mild left ventricular hypertrophy. Left ventricular diastolic  parameters were normal.   3. Right ventricular systolic function is normal. The right ventricular  size is normal.  4. Left atrial size was severely dilated.   5. The mitral valve is normal in structure. No evidence of mitral valve  regurgitation. No evidence of mitral stenosis.   6. Degree of AR poorly characterized. Appears to be holosystolic flow  reversal in supra sternal images but color flow does not show vena  contracta and incompolete AR doppler signals Can consider another f/u TEE  or other modality like MRI to follow . The   aortic valve was not well visualized. Aortic valve regurgitation is  moderate. No aortic stenosis is present.   7. Aortic dilatation noted. There is moderate dilatation of the ascending  aorta, measuring 42 mm.   8. The inferior vena cava is normal in size with greater than 50%  respiratory variability, suggesting right atrial pressure of 3 mmHg.  TEE 01/29/2021: 1. DCC attempted x 4 with 200J biphasic and AP manual  compression Patient  failed to convert to sinus.   2. Left ventricular ejection fraction, by estimation, is 30 to 35%. The  left ventricle has moderately decreased function. The left ventricle  demonstrates global hypokinesis. The left ventricular internal cavity size  was severely dilated.   3. Right ventricular systolic function is normal. The right ventricular  size is normal.   4. Extensive 3D imaging of LAA performed. Left atrial size was severely  dilated. No left atrial/left atrial appendage thrombus was detected.   5. Right atrial size was mildly dilated.   6. 3 D imaging MV performed . The mitral valve is normal in structure.  Mild mitral valve regurgitation.   7. Tricuspid valve regurgitation is moderate.   8. The aortic valve is tricuspid. Aortic valve regurgitation is severe.   Echo: 01/28/21 IMPRESSIONS   1. Left ventricular ejection fraction, by estimation, is 35-40%. The left  ventricle has moderately decreased function. Left ventricular endocardial  border not optimally defined to evaluate regional wall motion, even with  Definity contrast. There is mild   concentric left ventricular hypertrophy. Left ventricular diastolic  function could not be evaluated.   2. Right ventricular systolic function is normal. The right ventricular  size is normal. There is mildly elevated pulmonary artery systolic  pressure.   3. Left atrial size was severely dilated.   4. Right atrial size was moderately dilated.   5. The mitral valve is normal in structure. No evidence of mitral valve  regurgitation. No evidence of mitral stenosis.   6. The aortic valve is normal in structure. Aortic valve regurgitation is  not visualized. No aortic stenosis is present.   7. There is mild dilatation of the aortic root, measuring 46 mm. There is  mild dilatation of the ascending aorta, measuring 41 mm.   8. The inferior vena cava is dilated in size with <50% respiratory  variability, suggesting right  atrial pressure of 15 mmHg.   EKG:   No new tracing  Recent Labs: 01/27/2021: ALT 34; B Natriuretic Peptide 475.5 01/28/2021: TSH 2.382 01/30/2021: Hemoglobin 13.7; Platelets 185 03/17/2021: Magnesium 2.3 04/21/2021: BUN 19; Creatinine, Ser 1.02; Potassium 4.6; Sodium 140  Recent Lipid Panel    Component Value Date/Time   CHOL 153 01/28/2021 0039   TRIG 165 (H) 01/28/2021 0039   HDL 28 (L) 01/28/2021 0039   CHOLHDL 5.5 01/28/2021 0039   VLDL 33 01/28/2021 0039   LDLCALC 92 01/28/2021 0039   LDLCALC 127 (H) 12/19/2018 0743     Risk Assessment/Calculations:    CHA2DS2-VASc Score = 2  This indicates a 2.2%  annual risk of stroke. The patient's score is based upon: CHF History: Yes HTN History: Yes Diabetes History: No Stroke History: No Vascular Disease History: No Age Score: 0 Gender Score: 0    Physical Exam:    VS:  BP 138/82   Pulse 64   Ht 5\' 9"  (1.753 m)   Wt (!) 307 lb 3.2 oz (139.3 kg)   SpO2 96%   BMI 45.37 kg/m     Wt Readings from Last 3 Encounters:  04/28/21 (!) 307 lb 3.2 oz (139.3 kg)  03/17/21 (!) 311 lb (141.1 kg)  03/05/21 (!) 319 lb (144.7 kg)     GEN: Well nourished, well developed in no acute distress HEENT: Normal NECK: No JVD; No carotid bruits CARDIAC: RRR, no murmurs, rubs, gallops RESPIRATORY:  Clear to auscultation without rales, wheezing or rhonchi  ABDOMEN: Morbidly obese, soft, non-tender, non-distended MUSCULOSKELETAL:  No edema; No deformity  SKIN: Warm and dry NEUROLOGIC:  Alert and oriented x 3 PSYCHIATRIC:  Normal affect   ASSESSMENT:    1. Chronic combined systolic and diastolic CHF (congestive heart failure) (HCC)   2. Persistent atrial fibrillation (HCC)   3. HYPERTENSION, BENIGN SYSTEMIC   4. Medication management   5. Morbid obesity (HCC)   6. Severe obesity (BMI >= 40) (HCC)   7. Nonrheumatic aortic valve insufficiency   8. Monomorphic ventricular tachycardia (HCC)    PLAN:    In order of problems listed  above:  #Paroxysmal A fib with RVR:  CHADs-vasc 2. Patient recently presented to [facility] for progressive SOB x2 weeks, intermittent orthopnea, lower extremity edema, and fatigue found to have new onset atrial fibrillation with RVR with HR 170s.  TTE with LVEF 35-40%, unable assess RWMA and diastolic function, RV normal, LA severely dilated,  RA moderately dilated,  mild dilatation of the aortic root, measuring 46 mm, mild dilatation of the ascending aorta, measuring 41 mm. S/p failed coversion from TEE with DCCV x4 on 01/29/21. EP consulted and patient underwent Tikosyn 01/31/21 BID load with successful conversion to NSR. Repeat TTE 04/21/21 showed improved LVEF 45-50% with apical hypokinesis, severe LAE. --Continue apixaban 5mg  BID for AC --Continue dofetilide 06/22/21 BID --Follow-up with EP as scheduled   #Newly diagnosed combined systolic and diastolic heart failure TTE 01/27/21 with  LVEF 35-40%, unable assess RWMA and diastolic function, RV normal, LA severely dilated,  RA moderately dilated, mild dilatation of the aortic root, measuring 46 mm, mild dilatation of the ascending aorta, measuring 41 mm. TEE with EF 30%, LV global hypokinesis,  mild MR, severe AR, no LAA thrombus, moderate TR. HIV negative; TSH WNL; A1C 4.7%; LDL 92 from this admission. 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 Repeat TTE 04/21/21, LVEF 45-50% with apical hypokinesis. --TTE 01/2021  EF 35-40%, Severe LAE, Mod RAE, normal RV. This was followed by a TEE  with EF 30-35%, Severe LAE, mild MR, Mod TR, Severe AI. --TTE 04/2021 with improved LVEF 45-50% --Check CMR for AI, EF and apical hypokinesis --Increase Entresto to 97/103 mg BID --Continue spironolactone 12.5mg  daily  --Continue metoprolol 100mg  XL daily --Continue torsemide 20mg  as needed; has been euvolemic (currently taking 2 times per week) --Continue Farxiga 10mg  daily --Low Na diet --Monitor daily weights --Check BMET    #Monomorphic VT --Continue metop succinate 100mg  XL daily  --Follow-up with EP   #Severe Aortic regurgitation Noted on TEE with mild dilatation of the aortic root, measuring 46 mm, mild dilatation of the ascending aorta, measuring 41  mm. Now planned for CMR. --Check CMR  #HTN: Controlled at home 120s/80s. --Increase Entresto to 97/103 mg BID --Continue metop 100mg  XL daily   #Morbid obesity  --Continue weight loss efforts --Consider ozempic at next visit --Continue to work with trainer; goal per week   #Snoring  --Sleep study per PCP    Follow-up in 3 months.  Medication Adjustments/Labs and Tests Ordered: Current medicines are reviewed at length with the patient today.  Concerns regarding medicines are outlined above.  Orders Placed This Encounter  Procedures   Basic metabolic panel   Meds ordered this encounter  Medications   apixaban (ELIQUIS) 5 MG TABS tablet    Sig: TAKE 1 TABLET(5 MG) BY MOUTH TWICE DAILY    Dispense:  60 tablet    Refill:  5   metoprolol succinate (TOPROL-XL) 100 MG 24 hr tablet    Sig: Take 1 tablet (100 mg total) by mouth daily. Take with or immediately following a meal.    Dispense:  90 tablet    Refill:  3    Please cancel all previous orders for current medication. Change in dosage or pill size.   spironolactone (ALDACTONE) 25 MG tablet    Sig: Take 1/2 tablets (12.5 mg total) by mouth daily.    Dispense:  45 tablet    Refill:  3   sacubitril-valsartan (ENTRESTO) 97-103 MG    Sig: Take 1 tablet by mouth 2 (two) times daily.    Dispense:  60 tablet    Refill:  6    Patient Instructions  Medication Instructions:  Your physician has recommended you make the following change in your medication:   INCREASE the Entresto to 97/103 taking 1 tablet twice a day     *If you need a refill on your cardiac medications before your next appointment, please call your pharmacy*   Lab Work: 1 WEEK:  BMET  If you have labs (blood work)  drawn today and your tests are completely normal, you will receive your results only by: MyChart Message (if you have MyChart) OR A paper copy in the mail If you have any lab test that is abnormal or we need to change your treatment, we will call you to review the results.   Testing/Procedures: None ordered   Follow-Up: At [facility], you and your health needs are our priority.  As part of our continuing mission to provide you with exceptional heart care, we have created designated Provider Care Teams.  These Care Teams include your primary Cardiologist (physician) and Advanced Practice Providers (APPs -  Physician Assistants and Nurse Practitioners) who all work together to provide you with the care you need, when you need it.  We recommend signing up for the patient portal called "MyChart".  Sign up information is provided on this After Visit Summary.  MyChart is used to connect with patients for Virtual Visits (Telemedicine).  Patients are able to view lab/test results, encounter notes, upcoming appointments, etc.  Non-urgent messages can be sent to your provider as well.   To learn more about what you can do with MyChart, go to [facility].    Your next appointment:   After Cardiac MRI with one of Dr. [email or web address] Assitants  The format for your next appointment:   In Person  Provider:   You may see Devin Going, MD or one of the following Advanced Practice Providers on your designated Care Team:   Meriam Sprague, PA-C Tereso Newcomer, Chelsea Aus   Other Instructions  Signed, Meriam Sprague, MD  04/28/2021 1:33 PM    [facility]

## 2021-04-28 ENCOUNTER — Ambulatory Visit (INDEPENDENT_AMBULATORY_CARE_PROVIDER_SITE_OTHER): Payer: Commercial Managed Care - PPO | Admitting: Cardiology

## 2021-04-28 ENCOUNTER — Other Ambulatory Visit: Payer: Self-pay

## 2021-04-28 ENCOUNTER — Encounter: Payer: Self-pay | Admitting: Cardiology

## 2021-04-28 VITALS — BP 138/82 | HR 64 | Ht 69.0 in | Wt 307.2 lb

## 2021-04-28 DIAGNOSIS — I4729 Other ventricular tachycardia: Secondary | ICD-10-CM

## 2021-04-28 DIAGNOSIS — Z79899 Other long term (current) drug therapy: Secondary | ICD-10-CM | POA: Diagnosis not present

## 2021-04-28 DIAGNOSIS — I4819 Other persistent atrial fibrillation: Secondary | ICD-10-CM

## 2021-04-28 DIAGNOSIS — I5042 Chronic combined systolic (congestive) and diastolic (congestive) heart failure: Secondary | ICD-10-CM

## 2021-04-28 DIAGNOSIS — I10 Essential (primary) hypertension: Secondary | ICD-10-CM

## 2021-04-28 DIAGNOSIS — I351 Nonrheumatic aortic (valve) insufficiency: Secondary | ICD-10-CM

## 2021-04-28 DIAGNOSIS — I472 Ventricular tachycardia: Secondary | ICD-10-CM

## 2021-04-28 MED ORDER — APIXABAN 5 MG PO TABS: ORAL_TABLET | ORAL | 5 refills | Status: DC

## 2021-04-28 MED ORDER — METOPROLOL SUCCINATE ER 100 MG PO TB24: 100.0000 mg | ORAL_TABLET | Freq: Every day | ORAL | 3 refills | Status: DC

## 2021-04-28 MED ORDER — ENTRESTO 97-103 MG PO TABS: 1.0000 | ORAL_TABLET | Freq: Two times a day (BID) | ORAL | 6 refills | Status: DC

## 2021-04-28 MED ORDER — SPIRONOLACTONE 25 MG PO TABS
12.5000 mg | ORAL_TABLET | Freq: Every day | ORAL | 3 refills | Status: DC
Start: 2021-04-28 — End: 2022-02-20

## 2021-04-28 NOTE — Patient Instructions (Addendum)
Medication Instructions:  Your physician has recommended you make the following change in your medication:   INCREASE the Entresto to 97/103 taking 1 tablet twice a day     *If you need a refill on your cardiac medications before your next appointment, please call your pharmacy*   Lab Work: 1 WEEK:  BMET  If you have labs (blood work) drawn today and your tests are completely normal, you will receive your results only by: MyChart Message (if you have MyChart) OR A paper copy in the mail If you have any lab test that is abnormal or we need to change your treatment, we will call you to review the results.   Testing/Procedures: None ordered   Follow-Up: At [facility], you and your health needs are our priority.  As part of our continuing mission to provide you with exceptional heart care, we have created designated Provider Care Teams.  These Care Teams include your primary Cardiologist (physician) and Advanced Practice Providers (APPs -  Physician Assistants and Nurse Practitioners) who all work together to provide you with the care you need, when you need it.  We recommend signing up for the patient portal called "MyChart".  Sign up information is provided on this After Visit Summary.  MyChart is used to connect with patients for Virtual Visits (Telemedicine).  Patients are able to view lab/test results, encounter notes, upcoming appointments, etc.  Non-urgent messages can be sent to your provider as well.   To learn more about what you can do with MyChart, go to [email or web address].    Your next appointment:   After Cardiac MRI with one of Dr. Devin Going Assitants  The format for your next appointment:   In Person  Provider:   You may see Meriam Sprague, MD or one of the following Advanced Practice Providers on your designated Care Team:   Tereso Newcomer, PA-C Chelsea Aus, [place]   Other Instructions

## 2021-05-01 ENCOUNTER — Telehealth: Payer: Self-pay | Admitting: *Deleted

## 2021-05-01 NOTE — Telephone Encounter (Signed)
-----   Message from Francine Graven sent at 05/01/2021  7:46 AM EDT ----- Regarding: RE: cardiac MRI per Dedra Skeens morning,   An approved auth was documented 7/19. ----- Message ----- From: Loa Socks, LPN Sent: 5/42/7062   4:24 PM EDT To: Belva Bertin, # Subject: FW: cardiac MRI per Shari Prows                  Any word on his Cardiac MRI?  Shari Prows was asking me and I told her I would touch base.  Thank you all for everything you do.  Thanks, Lajoyce Corners  ----- Message ----- From: Loa Socks, LPN Sent: 3/76/2831   2:28 PM EDT To: Belva Bertin Subject: FW: cardiac MRI per Shari Prows                  Thank you love   ----- Message ----- From: Belva Bertin Sent: 04/22/2021   2:24 PM EDT To: Loa Socks, LPN Subject: RE: cardiac MRI per Shari Prows                  I sure can, I will get it sent to pre cert now.  ----- Message ----- From: Loa Socks, LPN Sent: 02/26/6159   2:08 PM EDT To: Belva Bertin Subject: cardiac MRI per Shari Prows                      Dr. Shari Prows would like for this pt to have a Cardiac MRI done for noted moderate aortic regurgitation on recent echo and pt has symptoms.  Order is in and the pt is aware that you will call to schedule. Can you pretty please schedule and shoot me the date when done?  Thanks for all you do, Lajoyce Corners

## 2021-05-07 ENCOUNTER — Encounter: Payer: Self-pay | Admitting: *Deleted

## 2021-05-09 ENCOUNTER — Other Ambulatory Visit: Payer: Commercial Managed Care - PPO

## 2021-05-11 ENCOUNTER — Ambulatory Visit (HOSPITAL_BASED_OUTPATIENT_CLINIC_OR_DEPARTMENT_OTHER): Payer: Commercial Managed Care - PPO | Attending: Family Medicine | Admitting: Internal Medicine

## 2021-05-11 ENCOUNTER — Other Ambulatory Visit: Payer: Self-pay

## 2021-05-11 VITALS — Ht 69.0 in | Wt 300.0 lb

## 2021-05-11 DIAGNOSIS — G4733 Obstructive sleep apnea (adult) (pediatric): Secondary | ICD-10-CM | POA: Diagnosis not present

## 2021-05-11 DIAGNOSIS — R0683 Snoring: Secondary | ICD-10-CM

## 2021-05-15 ENCOUNTER — Other Ambulatory Visit: Payer: Self-pay

## 2021-05-15 ENCOUNTER — Other Ambulatory Visit: Payer: Commercial Managed Care - PPO

## 2021-05-15 DIAGNOSIS — Z79899 Other long term (current) drug therapy: Secondary | ICD-10-CM

## 2021-05-15 DIAGNOSIS — I10 Essential (primary) hypertension: Secondary | ICD-10-CM

## 2021-05-15 DIAGNOSIS — I4819 Other persistent atrial fibrillation: Secondary | ICD-10-CM

## 2021-05-15 DIAGNOSIS — I5042 Chronic combined systolic (congestive) and diastolic (congestive) heart failure: Secondary | ICD-10-CM

## 2021-05-15 LAB — BASIC METABOLIC PANEL
BUN/Creatinine Ratio: 23 — ABNORMAL HIGH (ref 9–20)
BUN: 24 mg/dL (ref 6–24)
CO2: 18 mmol/L — ABNORMAL LOW (ref 20–29)
Calcium: 9.3 mg/dL (ref 8.7–10.2)
Chloride: 102 mmol/L (ref 96–106)
Creatinine, Ser: 1.05 mg/dL (ref 0.76–1.27)
Glucose: 81 mg/dL (ref 65–99)
Potassium: 4.6 mmol/L (ref 3.5–5.2)
Sodium: 138 mmol/L (ref 134–144)
eGFR: 83 mL/min/{1.73_m2} (ref >59)

## 2021-05-24 DIAGNOSIS — R0683 Snoring: Secondary | ICD-10-CM | POA: Diagnosis not present

## 2021-05-24 NOTE — Procedures (Signed)
   Patient Name: Joe Morrison, Joe Morrison Date: 05/11/2021 Gender: Male D.O.B: 01/02/1966 Age (years): 54 Referring Provider: Nani Gasser Height (inches): 69 Interpreting Physician: Jetty Duhamel MD, ABSM Weight (lbs): 300 RPSGT: Rolene Arbour BMI: 44 MRN: [id number] Neck Size: 19.00  CLINICAL INFORMATION Sleep Study Type: NPSG Indication for sleep study: Excessive Daytime Sleepiness, Snoring Epworth Sleepiness Score: 10  SLEEP STUDY TECHNIQUE As per the AASM Manual for the Scoring of Sleep and Associated Events v2.3 (April 2016) with a hypopnea requiring 4% desaturations.  The channels recorded and monitored were frontal, central and occipital EEG, electrooculogram (EOG), submentalis EMG (chin), nasal and oral airflow, thoracic and abdominal wall motion, anterior tibialis EMG, snore microphone, electrocardiogram, and pulse oximetry.  MEDICATIONS Medications self-administered by patient taken the night of the study : MELATONIN  SLEEP ARCHITECTURE The study was initiated at 10:28:31 PM and ended at 4:31:40 AM.  Sleep onset time was 23.9 minutes and the sleep efficiency was 35.4%%. The total sleep time was 128.5 minutes.  Stage REM latency was 208.5 minutes.  The patient spent 8.2%% of the night in stage N1 sleep, 78.2%% in stage N2 sleep, 0.0%% in stage N3 and 13.6% in REM.  Alpha intrusion was absent.  Supine sleep was 56.42%.  RESPIRATORY PARAMETERS The overall apnea/hypopnea index (AHI) was 34.1 per hour. There were 34 total apneas, including 30 obstructive, 4 central and 0 mixed apneas. There were 39 hypopneas and 32 RERAs.  The AHI during Stage REM sleep was 48.0 per hour.  AHI while supine was 38.1 per hour.  The mean oxygen saturation was 95.1%. The minimum SpO2 during sleep was 90.0%.  moderate snoring was noted during this study.  CARDIAC DATA The 2 lead EKG demonstrated sinus rhythm. The mean heart rate was 52.0 beats per minute. Other EKG findings  include: None.  LEG MOVEMENT DATA The total PLMS were 0 with a resulting PLMS index of 0.0. Associated arousal with leg movement index was 3.3 .  IMPRESSIONS - Severe obstructive sleep apnea occurred during this study (AHI = 34.1/h). - No significant central sleep apnea occurred during this study (CAI = 1.9/h). - The patient had minimal or no oxygen desaturation during the study (Min O2 = 90.0%) - The patient snored with moderate snoring volume. - No cardiac abnormalities were noted during this study. - Clinically significant periodic limb movements did not occur during sleep. No significant associated arousals.  DIAGNOSIS          Obstructive sleep apnea  RECOMMENDATIONS - Suggest CPAP titration sleep study or autopap.Other options would be based on clinical judgment,. - Be careful with alcohol, sedatives and other CNS depressants that may worsen sleep apnea and disrupt normal sleep architecture. - Sleep hygiene should be reviewed to assess factors that may improve sleep quality. - Weight management and regular exercise should be initiated or continued if appropriate.  [Electronically signed] 05/24/2021 10:20 AM  Jetty Duhamel MD, ABSM Diplomate, American Board of Sleep Medicine   NPI: [id number]                          Jetty Duhamel Diplomate, American Board of Sleep Medicine  ELECTRONICALLY SIGNED ON:  05/24/2021, 10:17 AM [facility] PH: [phone]   FX: [phone] ACCREDITED BY THE AMERICAN ACADEMY OF SLEEP MEDICINE

## 2021-05-28 ENCOUNTER — Encounter: Payer: Self-pay | Admitting: Family Medicine

## 2021-05-28 ENCOUNTER — Telehealth: Payer: Self-pay | Admitting: Family Medicine

## 2021-05-28 NOTE — Telephone Encounter (Signed)
Call patient and see if we can get him on the schedule for a virtual visit to go over his sleep study results I have reviewed them.  See if we can do it around 1130 on Friday or over lunch.

## 2021-05-29 ENCOUNTER — Encounter: Payer: Self-pay | Admitting: Family Medicine

## 2021-05-30 ENCOUNTER — Encounter: Payer: Self-pay | Admitting: Family Medicine

## 2021-05-30 ENCOUNTER — Telehealth: Payer: Self-pay

## 2021-05-30 ENCOUNTER — Telehealth (INDEPENDENT_AMBULATORY_CARE_PROVIDER_SITE_OTHER): Payer: Commercial Managed Care - PPO | Admitting: Family Medicine

## 2021-05-30 DIAGNOSIS — G4733 Obstructive sleep apnea (adult) (pediatric): Secondary | ICD-10-CM

## 2021-05-30 MED ORDER — AMBULATORY NON FORMULARY MEDICATION: 0 refills | Status: DC

## 2021-05-30 MED ORDER — NIRMATRELVIR/RITONAVIR (PAXLOVID)TABLET: 3.0000 | ORAL_TABLET | Freq: Two times a day (BID) | ORAL | 0 refills | Status: DC

## 2021-05-30 NOTE — Progress Notes (Signed)
Virtual Visit via Video Note  I connected with Joe Morrison on 05/30/21 at 11:30 AM EDT by a video enabled telemedicine application and verified that I am speaking with the correct person using two identifiers.   I discussed the limitations of evaluation and management by telemedicine and the availability of in person appointments. The patient expressed understanding and agreed to proceed.  Patient location: at home Provider location: in office  Subjective:    CC: Go over sleep study results.   HPI: First time tested for sleep apnea.  He has had symptoms for years.  He did see the report online.  He also is going to [place] for 2 weeks and is leaving early next week he wants to know if he could have a prescription for Paxil bid to take while traveling if needed if he test positive for COVID or picks It up he is gone try to be preventative by wearing his mask at the airport etc.  He is technically moderate risk for complications from COVID.  Normal GFR.   Past medical history, Surgical history, Family history not pertinant except as noted below, Social history, Allergies, and medications have been entered into the medical record, reviewed, and corrections made.    Objective:    General: Speaking clearly in complete sentences without any shortness of breath.  Alert and oriented x3.  Normal judgment. No apparent acute distress.    Impression and Recommendations:    OSA (obstructive sleep apnea) Discussed results. Severe OSA. AHI 34.  Discussed treatment options including weight loss, regular exercise, sleep hygiene, CPAP.  He would like to move forward with CPAP we will order from one of the DME suppliers we should contact him did let him know that there are some issues with CPAP being backordered so hopefully there will be too long of a wait for him to get treatment we also discussed the benefits of treatment for sleep apnea and the potential negative consequences of untreated sleep  apnea.  Plan to follow-up in 4 weeks after he gets his CPAP machine we will start with AutoPap and then set to adequate pressure based on downloaded results.  Also with recent travel did send over prescription for Paxlovid to use in case he gets to test positive for COVID.  I am not sure that he would have access to the drug while traveling.  No orders of the defined types were placed in this encounter.   Meds ordered this encounter  Medications   nirmatrelvir/ritonavir EUA (PAXLOVID) 20 x 150 MG & 10 x 100MG  TABS    Sig: Take 3 tablets by mouth 2 (two) times daily for 5 days. (Take nirmatrelvir 150 mg two tablets twice daily for 5 days and ritonavir 100 mg one tablet twice daily for 5 days) Patient GFR is 83    Dispense:  30 tablet    Refill:  0   AMBULATORY NON FORMULARY MEDICATION    Sig: Medication Name: CPAP with tubing and mask and humidifier set to AutoPap between 4 and 20 cm of water pressure.  Please fax download after 10 days, to [phone]    Dispense:  1 Units    Refill:  0     I discussed the assessment and treatment plan with the patient. The patient was provided an opportunity to ask questions and all were answered. The patient agreed with the plan and demonstrated an understanding of the instructions.   The patient was advised to call back or seek an  in-person evaluation if the symptoms worsen or if the condition fails to improve as anticipated.   Nani Gasser, MD

## 2021-05-30 NOTE — Assessment & Plan Note (Addendum)
Discussed results. Severe OSA. AHI 34.  Discussed treatment options including weight loss, regular exercise, sleep hygiene, CPAP.  He would like to move forward with CPAP we will order from one of the DME suppliers we should contact him did let him know that there are some issues with CPAP being backordered so hopefully there will be too long of a wait for him to get treatment we also discussed the benefits of treatment for sleep apnea and the potential negative consequences of untreated sleep apnea.  Plan to follow-up in 4 weeks after he gets his CPAP machine we will start with AutoPap and then set to adequate pressure based on downloaded results.

## 2021-05-30 NOTE — Telephone Encounter (Signed)
Received VM from [facility].   Paxlovid contraindicated with Eliquis.  Requesting to switch medication via callback to [phone]

## 2021-06-02 MED ORDER — MOLNUPIRAVIR EUA 200MG CAPSULE: 4.0000 | ORAL_CAPSULE | Freq: Two times a day (BID) | ORAL | 0 refills | Status: AC

## 2021-06-02 NOTE — Telephone Encounter (Signed)
Switched to mulnopirvarl.

## 2021-06-02 NOTE — Addendum Note (Signed)
Addended by: Nani Gasser D on: 06/02/2021 11:12 AM   Modules accepted: Orders

## 2021-06-17 ENCOUNTER — Other Ambulatory Visit: Payer: Self-pay | Admitting: Cardiology

## 2021-06-17 ENCOUNTER — Other Ambulatory Visit (HOSPITAL_COMMUNITY): Payer: Self-pay | Admitting: Cardiology

## 2021-06-17 DIAGNOSIS — I7121 Aneurysm of the ascending aorta, without rupture: Secondary | ICD-10-CM

## 2021-06-17 DIAGNOSIS — I712 Thoracic aortic aneurysm, without rupture: Secondary | ICD-10-CM

## 2021-06-18 ENCOUNTER — Telehealth (HOSPITAL_COMMUNITY): Payer: Self-pay | Admitting: Emergency Medicine

## 2021-06-18 ENCOUNTER — Encounter: Payer: Self-pay | Admitting: Family Medicine

## 2021-06-18 ENCOUNTER — Other Ambulatory Visit: Payer: Self-pay | Admitting: *Deleted

## 2021-06-18 DIAGNOSIS — G4733 Obstructive sleep apnea (adult) (pediatric): Secondary | ICD-10-CM

## 2021-06-18 MED ORDER — AMBULATORY NON FORMULARY MEDICATION
0 refills | Status: DC
Start: 2021-06-18 — End: 2021-07-15

## 2021-06-18 NOTE — Telephone Encounter (Signed)
Unable to leave message

## 2021-06-18 NOTE — Telephone Encounter (Signed)
Joe Morrison can you ckeck on this.  Order was printed on 8/19

## 2021-06-20 ENCOUNTER — Ambulatory Visit (HOSPITAL_COMMUNITY)
Admission: RE | Admit: 2021-06-20 | Discharge: 2021-06-20 | Disposition: A | Discharge status: Home / Self Care | Payer: Commercial Managed Care - PPO | Source: Ambulatory Visit | Attending: Cardiology | Admitting: Cardiology

## 2021-06-20 ENCOUNTER — Other Ambulatory Visit: Payer: Self-pay

## 2021-06-20 DIAGNOSIS — I4819 Other persistent atrial fibrillation: Secondary | ICD-10-CM | POA: Diagnosis present

## 2021-06-20 DIAGNOSIS — I472 Ventricular tachycardia: Secondary | ICD-10-CM | POA: Diagnosis present

## 2021-06-20 DIAGNOSIS — I5042 Chronic combined systolic (congestive) and diastolic (congestive) heart failure: Secondary | ICD-10-CM | POA: Diagnosis present

## 2021-06-20 DIAGNOSIS — I4729 Other ventricular tachycardia: Secondary | ICD-10-CM

## 2021-06-20 DIAGNOSIS — I712 Thoracic aortic aneurysm, without rupture: Secondary | ICD-10-CM | POA: Diagnosis present

## 2021-06-20 DIAGNOSIS — I351 Nonrheumatic aortic (valve) insufficiency: Secondary | ICD-10-CM

## 2021-06-20 DIAGNOSIS — I7121 Aneurysm of the ascending aorta, without rupture: Secondary | ICD-10-CM

## 2021-06-20 MED ORDER — GADOBUTROL 1 MMOL/ML IV SOLN
10.0000 mL | Freq: Once | INTRAVENOUS | Status: AC | PRN
  Administered 2021-06-20: 10 mL via INTRAVENOUS

## 2021-06-20 NOTE — Telephone Encounter (Signed)
Yes, 6 month point would be perfect

## 2021-07-01 ENCOUNTER — Encounter: Payer: Self-pay | Admitting: Family Medicine

## 2021-07-14 ENCOUNTER — Telehealth: Payer: Self-pay | Admitting: *Deleted

## 2021-07-14 NOTE — Telephone Encounter (Signed)
pt is wanting to know about Covid boosters as well as cpap info... please advise

## 2021-07-14 NOTE — Telephone Encounter (Signed)
Pt was calling to get more information on covid boosters, when to get them, and where to get them. Information provided to the pt about this.   Pt also asked if he had his sleep study and equipment ordered by his PCP, and there is a delay getting his CPAP supplies into him, would he need to continue inquiring the delay with supplies via his PCP who ordered this, or do we advise on this.  Informed the pt that being his PCP ordered his sleep study and supplies, he should continue following up with them about getting his CPAP supplies. Pt verbalized understanding and agrees with this plan.

## 2021-07-15 ENCOUNTER — Other Ambulatory Visit: Payer: Self-pay | Admitting: *Deleted

## 2021-07-15 ENCOUNTER — Other Ambulatory Visit: Payer: Self-pay

## 2021-07-15 DIAGNOSIS — G4733 Obstructive sleep apnea (adult) (pediatric): Secondary | ICD-10-CM

## 2021-07-15 MED ORDER — AMBULATORY NON FORMULARY MEDICATION
0 refills | Status: DC
Start: 2021-07-15 — End: 2021-09-09

## 2021-07-15 MED ORDER — AMBULATORY NON FORMULARY MEDICATION
0 refills | Status: DC
Start: 2021-07-15 — End: 2021-07-15

## 2021-07-15 MED ORDER — AMBULATORY NON FORMULARY MEDICATION: 0 refills | Status: DC

## 2021-07-15 NOTE — Telephone Encounter (Signed)
Called [facility], spoke with Judeth Cornfield and Camelia Eng, and they said that they did not have any information in their system for this pt. I called around to other companies and faxed his information (Rx order, demographics, insurance card, initial visit, test results, and follow up visit) to [facility]. William with [employer] said that once they receive the information they will reach out and work on getting him started. Pt aware of this info via MyChart message above

## 2021-07-17 ENCOUNTER — Telehealth (INDEPENDENT_AMBULATORY_CARE_PROVIDER_SITE_OTHER): Payer: Commercial Managed Care - PPO | Admitting: Family Medicine

## 2021-07-17 ENCOUNTER — Encounter: Payer: Self-pay | Admitting: Family Medicine

## 2021-07-17 VITALS — Temp 97.7°F

## 2021-07-17 DIAGNOSIS — J069 Acute upper respiratory infection, unspecified: Secondary | ICD-10-CM

## 2021-07-17 NOTE — Progress Notes (Signed)
Virtual Visit via Video Note  I connected with Joe Morrison on 07/17/21 at 11:30 AM EDT by a video enabled telemedicine application and verified that I am speaking with the correct person using two identifiers.   I discussed the limitations of evaluation and management by telemedicine and the availability of in person appointments. The patient expressed understanding and agreed to proceed.  Patient location: at home Provider location: in office  Subjective:    CC: Mild ST  HPI:  2 nights ago started with a mild scratchy throat. Sore throat x 1 day. He reports that his sxs started yesterday. He states that that morning his throat felt scratchy and by that evening his throat felt sore.He felt like he had a fever however, he did not take his temperature. He took a covid test last night and it was negative.   Coughed up some light green mucus, no problems with swallowing,nasal or sinus issues no recent sick contacts. Using cough  drops.      Past medical history, Surgical history, Family history not pertinant except as noted below, Social history, Allergies, and medications have been entered into the medical record, reviewed, and corrections made.    Objective:    General: Speaking clearly in complete sentences without any shortness of breath.  Alert and oriented x3.  Normal judgment. No apparent acute distress.    Impression and Recommendations:    No problem-specific Assessment & Plan notes found for this encounter.  URI - likely viral.  Negative for COVID thus far but I did encourage him to check again today and may [place] test 1 more time tomorrow if he still negative.  We have had quite a few people who are testing negative in the first few days of illness and then suddenly become positive.  If he is gone to be out about recommend just wearing a mask and taking safety precautions.  He is cannot stay out of work today.  Okay to use over-the-counter symptomatic treatment.  Call  if any concerns or if not improving over the next week or if develops sudden worsening of symptoms.  He actually still has a prescription for more new pair of year that he never took when he was getting ready to travel recently we did discuss that it is okay to start that if he does test positive for COVID.  No orders of the defined types were placed in this encounter.   No orders of the defined types were placed in this encounter.    I discussed the assessment and treatment plan with the patient. The patient was provided an opportunity to ask questions and all were answered. The patient agreed with the plan and demonstrated an understanding of the instructions.   The patient was advised to call back or seek an in-person evaluation if the symptoms worsen or if the condition fails to improve as anticipated.   Nani Gasser, MD

## 2021-07-17 NOTE — Progress Notes (Signed)
Sore throat x 1 day. He reports that his sxs started yesterday. He states that that morning his throat felt scratchy and by that evening his throat felt sore.  He felt like he had a fever however, he did not take his temperature. He took a covid test last night and it was negative.  Coughed up some light green mucus, no problems with swallowing,nasal or sinus issues no recent sick contacts.

## 2021-07-30 ENCOUNTER — Ambulatory Visit: Payer: Commercial Managed Care - PPO | Admitting: Physician Assistant

## 2021-08-11 ENCOUNTER — Other Ambulatory Visit (HOSPITAL_COMMUNITY): Payer: Self-pay

## 2021-08-11 DIAGNOSIS — I5042 Chronic combined systolic (congestive) and diastolic (congestive) heart failure: Secondary | ICD-10-CM

## 2021-08-11 MED ORDER — TORSEMIDE 20 MG PO TABS: ORAL_TABLET | ORAL | 0 refills | Status: DC

## 2021-08-23 ENCOUNTER — Other Ambulatory Visit (HOSPITAL_COMMUNITY): Payer: Self-pay | Admitting: Cardiology

## 2021-08-23 DIAGNOSIS — I5042 Chronic combined systolic (congestive) and diastolic (congestive) heart failure: Secondary | ICD-10-CM

## 2021-08-28 ENCOUNTER — Ambulatory Visit (HOSPITAL_BASED_OUTPATIENT_CLINIC_OR_DEPARTMENT_OTHER): Payer: Commercial Managed Care - PPO | Admitting: Family

## 2021-09-01 ENCOUNTER — Other Ambulatory Visit: Payer: Self-pay

## 2021-09-01 DIAGNOSIS — Z79899 Other long term (current) drug therapy: Secondary | ICD-10-CM

## 2021-09-01 DIAGNOSIS — I10 Essential (primary) hypertension: Secondary | ICD-10-CM

## 2021-09-01 DIAGNOSIS — I4819 Other persistent atrial fibrillation: Secondary | ICD-10-CM

## 2021-09-01 DIAGNOSIS — I5042 Chronic combined systolic (congestive) and diastolic (congestive) heart failure: Secondary | ICD-10-CM

## 2021-09-01 MED ORDER — DAPAGLIFLOZIN PROPANEDIOL 10 MG PO TABS: 10.0000 mg | ORAL_TABLET | Freq: Every day | ORAL | 8 refills | Status: DC

## 2021-09-09 ENCOUNTER — Encounter: Payer: Self-pay | Admitting: Family Medicine

## 2021-09-09 DIAGNOSIS — G4733 Obstructive sleep apnea (adult) (pediatric): Secondary | ICD-10-CM

## 2021-09-09 MED ORDER — AMBULATORY NON FORMULARY MEDICATION: 0 refills | Status: AC

## 2021-09-09 NOTE — Telephone Encounter (Signed)
New prescription.  Please fax so that they can adjust his pressure range for his AutoPap.  Please have them do a download after 10 days

## 2021-09-22 ENCOUNTER — Ambulatory Visit (HOSPITAL_BASED_OUTPATIENT_CLINIC_OR_DEPARTMENT_OTHER): Payer: Commercial Managed Care - PPO | Admitting: Family

## 2021-09-22 NOTE — Progress Notes (Deleted)
Office Visit    Patient Name: Joe Morrison Date of Encounter: 09/22/2021  PCP:  Hali Marry, MD   [place]  Cardiologist:  Freada Bergeron, MD  Advanced Practice Provider:  No care team member to display Electrophysiologist:  Cristopher Peru, MD      Chief Complaint    Joe Morrison is a 55 y.o. 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 male with a hx of atrial fibrillation on Tikosyn, combined systolic and diastolic heart failure, hypertension, severe aortic regurgitation, morbid obesity, OSA last seen 04/28/2021 by Dr. Johney Frame.  He was admitted April 2022 after presenting with worsening shortness of breath volume overload.  Found of new onset atrial fibrillation with RVR as well as acute systolic and diastolic heart failure LVEF 35 to 40%.  He was diuresed with improvement underwent DCCV however failed to return to normal sinus rhythm.  EP was consulted and he was subsequently was started on Tikosyn with successful return to NSR.  TTE 04/21/2021 LVEF 45 to 50%, moderately dilated LV, mild LVH,  severe LAE, moderate AI, ascending aorta 42 mm.  He was recommended for cardiac MRI.  He was seen 04/28/2021 by Dr. Johney Frame feeling overall well.  Did note episodes of anxiety where he felt like he was struggling to breathe with fear of going into atrial fibrillation.  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  Blood pressure well controlled.  Requiring torsemide only 1-2 times per week.  He had cardiac MRI 06/21/2021 showing LVEF had returned to normal with LVEF 54%, severe AI, dilation of aortic root 46 mm, dilation of ascending aorta 41 mm, moderate TR, mild MR.  He presents today for follow-up.***  EKGs/Labs/Other Studies Reviewed:   The following studies were reviewed today: Cardiac MRI 06/20/21 IMPRESSION: 1.  Severe aortic valve regurgitation (regurgitant fraction 51%)   2. Dilatation of aortic root measuring 71mm. Dilatation of ascending aorta measuring 82mm   3. Mild LV dilatation, mild hypertrophy, and normal systolic function (EF 123XX123)   4.  Normal RV size and systolic function (EF 99991111)   5.  No late gadolinium enhancement to suggest myocardial scar   6.  Moderate tricuspid regurgitation (regurgitant fraction 22%)   7.  Mild mitral regurgitation (regurgitant fraction 17%)  TTE 04/21/21: IMPRESSIONS   1. Patient in NSR compared to recent TEE done 01/29/21.   2. Apical hypokinesis . Left ventricular ejection fraction, by  estimation, is 45 to 50%. The left ventricle has mildly decreased  function. The left ventricle demonstrates regional wall motion  abnormalities (see scoring diagram/findings for description).  The left ventricular internal cavity size was moderately dilated. There  is mild left ventricular hypertrophy. Left ventricular diastolic  parameters were normal.   3. Right ventricular systolic function is normal. The right ventricular  size is normal.   4. Left atrial size was severely dilated.   5. The mitral valve is normal in structure. No evidence of mitral  valve  regurgitation. No evidence of mitral stenosis.   6. Degree of AR poorly characterized. Appears to be holosystolic flow  reversal in supra sternal images but color flow does not show vena  contracta and incompolete AR doppler signals Can consider another f/u TEE  or other modality like MRI to follow . The   aortic valve was not well visualized. Aortic valve regurgitation is  moderate. No aortic stenosis is present.   7. Aortic dilatation noted. There is moderate dilatation of the ascending  aorta, measuring 42 mm.   8. The inferior vena cava is normal in size with greater than 50%  respiratory variability, suggesting right atrial pressure of 3 mmHg.   TEE 01/29/2021: 1. DCC attempted x 4 with 200J biphasic and AP manual compression Patient  failed to convert to sinus.   2. Left ventricular ejection fraction, by estimation, is 30 to 35%. The  left ventricle has moderately decreased function. The left ventricle  demonstrates global hypokinesis. The left ventricular internal cavity size  was severely dilated.   3. Right ventricular systolic function is normal. The right ventricular  size is normal.   4. Extensive 3D imaging of LAA performed. Left atrial size was severely  dilated. No left atrial/left atrial appendage thrombus was detected.   5. Right atrial size was mildly dilated.   6. 3 D imaging MV performed . The mitral valve is normal in structure.  Mild mitral valve regurgitation.   7. Tricuspid valve regurgitation is moderate.   8. The aortic valve is tricuspid. Aortic valve regurgitation is severe.    Echo: 01/28/21 IMPRESSIONS   1. Left ventricular ejection fraction, by estimation, is 35-40%. The left  ventricle has moderately decreased function. Left ventricular endocardial  border not optimally defined to evaluate regional wall motion, even with  Definity contrast. There is mild   concentric left ventricular hypertrophy. Left ventricular diastolic  function could not  be evaluated.   2. Right ventricular systolic function is normal. The right ventricular  size is normal. There is mildly elevated pulmonary artery systolic  pressure.   3. Left atrial size was severely dilated.   4. Right atrial size was moderately dilated.   5. The mitral valve is normal in structure. No evidence of mitral valve  regurgitation. No evidence of mitral stenosis.   6. The aortic valve is normal in structure. Aortic valve regurgitation is  not visualized. No aortic stenosis is present.   7. There is mild dilatation of the aortic root, measuring 46 mm. There is  mild dilatation of the ascending aorta, measuring 41 mm.   8. The inferior vena cava is dilated in size with <50% respiratory  variability, suggesting right atrial pressure of 15 mmHg.     EKG:  EKG is  ordered today.  The ekg ordered today demonstrates ***  Recent Labs: 01/27/2021: ALT 34; B Natriuretic Peptide 475.5 01/28/2021: TSH 2.382 01/30/2021: Hemoglobin 13.7; Platelets 185 03/17/2021: Magnesium 2.3 05/15/2021: BUN 24; Creatinine, Ser 1.05; Potassium 4.6; Sodium 138  Recent Lipid Panel    Component Value Date/Time   CHOL 153 01/28/2021 0039   TRIG 165 (  H) 01/28/2021 0039   HDL 28 (L) 01/28/2021 0039   CHOLHDL 5.5 01/28/2021 0039   VLDL 33 01/28/2021 0039   LDLCALC 92 01/28/2021 0039   LDLCALC 127 (H) 12/19/2018 0743    Risk Assessment/Calculations:   CHA2DS2-VASc Score = 2   This indicates a 2.2% annual risk of stroke. The patient's score is based upon: CHF History: 1 HTN History: 1 Diabetes History: 0 Stroke History: 0 Vascular Disease History: 0 Age Score: 0 Gender Score: 0  Home Medications   No outpatient medications have been marked as taking for the 09/22/21 encounter (Appointment) with Loel Dubonnet, NP.     Review of Systems      All other systems reviewed and are otherwise negative except as noted above.  Physical Exam    VS:  There were no vitals taken for this visit. ,  BMI There is no height or weight on file to calculate BMI.  Wt Readings from Last 3 Encounters:  05/11/21 300 lb (136.1 kg)  04/28/21 (!) 307 lb 3.2 oz (139.3 kg)  03/17/21 (!) 311 lb (141.1 kg)     GEN: Well nourished, well developed, in no acute distress. HEENT: normal. Neck: Supple, no JVD, carotid bruits, or masses. Cardiac: ***RRR, no murmurs, rubs, or gallops. No clubbing, cyanosis, edema.  ***Radials/PT 2+ and equal bilaterally.  Respiratory:  ***Respirations regular and unlabored, clear to auscultation bilaterally. GI: Soft, nontender, nondistended. MS: No deformity or atrophy. Skin: Warm and dry, no rash. Neuro:  Strength and sensation are intact. Psych: Normal affect.  Assessment & Plan    PAF/on Tikosyn/chronic anticoagulation -   Combined systolic and diastolic heart failure.  Echo 01/27/2021 LVEF 35-40% in setting of atrial fibrillation.  Echo 04/2021 LVEF 45 to 50%. ***  Monomorphic VT-  Aortic regurgitation -   Hypertension -   Morbid obesity -   Snoring -   Disposition: Follow up {follow up:15908} with Freada Bergeron, MD or APP.  Signed, Loel Dubonnet, NP 09/22/2021, 7:37 AM [place]

## 2021-09-23 ENCOUNTER — Encounter (HOSPITAL_BASED_OUTPATIENT_CLINIC_OR_DEPARTMENT_OTHER): Payer: Self-pay | Admitting: General Practice

## 2021-09-23 NOTE — Progress Notes (Deleted)
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 Echo 4/22 LVEF 35-40%, unable assess RWMA and diastolic function, RV normal, LA severely dilated,  mod RAE, mild dilatation of the aortic root, measuring 46 mm, mild dilatation of the ascending aorta, 41 mm; b. Echo 7/22 improved EF of 45 to 50% up from 35 to 40%, apical hypokinesis, severe LAE, moderate RAE, moderate AV regurgitation, mod dilatation of the ascendig aorta, 42 mm.   Former tobacco use    Hypertension    Morbid obesity (HCC)    OSA (obstructive sleep apnea)    Persistent atrial fibrillation ([place])    S/p failed DCCV 4/22, on Tikosyn c/ restoration of NSR, Eliquis.   Severe aortic regurgitation    a. Echo 7/22 c/ mod AR; b. cMRI 9/22 c/ severe AR.   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   Past Surgical History:  Procedure Laterality Date   CARDIOVERSION N/A 01/29/2021   Procedure: CARDIOVERSION;  Surgeon: Josue Hector, MD;  Location: [facility] ENDOSCOPY;  Service: Cardiovascular;  Laterality: N/A;   MOUTH SURGERY     Dental work   TEE WITHOUT CARDIOVERSION N/A 01/29/2021   Procedure: TRANSESOPHAGEAL ECHOCARDIOGRAM (TEE);  Surgeon: Josue Hector, MD;  Location: [facility] ENDOSCOPY;  Service: Cardiovascular;  Laterality: N/A;   TONSILLECTOMY  1972     Allergies  No Known Allergies  History of Present Illness    55 year old male with the above past medical history including persistent atrial fibrillation s/p DCCV, on Tikosyn and Eliquis, 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  S/p failed DCCV on 01/2021. EP was consulted and he underwent Tikosyn load with successful conversion to normal sinus rhythm. Repeat echocardiogram on 04/21/2021 showed improved EF of 45 to 50% up from 35 to 40%, apical hypokinesis, severe LAE, moderate RAE.  Cardiac MRI  9/22 showed severe AR (regurgitant fraction 51%), dilatation of aortic root measuring 72mm, dilatation of ascending aorta measuring 92mm, mild LV dilatation, mild hypertrophy, and normal systolic function (EF 123XX123), normal RV size and systolic function, mild MR, and moderate TR. He was asymptomatic at the time.    He presents today for follow-up post cMRI.  Since his last visit  1. Severe aortic AR:  cMRI  9/22 showed severe AR (regurgitant fraction 51%), dilatation of aortic root measuring 76mm, dilatation of ascending aorta measuring 4mm, mild LV dilatation, mild hypertrophy, and normal systolic function (EF 123XX123), normal RV size and systolic function, mild MR, and moderate TR. He remains asymptomatic.  Repeat echo in 3 months. He should become symptomatic, will need to consider CT surgery referral for AVR.  2. Atrial fibrillation: S/p failed DCCV on 01/2021.  Now on Tikosyn with restoration of normal sinus  rhythm, Eliquis.  Continue Eliquis, Toprol-XL, and Tikosyn.   3. HFimpEF: Improved EF on cMRI 9/22 (54% up from 35-40%). Euvolemic and well compensated on exam.  Continue Farxiga 10 mg daily, torsemide 20 mg daily, Toprol XL 100 mg daily, spironolactone 25 mg daily, and Entresto 97-103 mg daily.  4. Hypertension: BP well controlled. Continue current  antihypertensive regimen as above.   5. OSA/morbid obesity: on CPAP.  Needs to work weight loss/lifestyle modifications with diet and exercise.  6. Disposition:    Home Medications    Current Outpatient Medications  Medication Sig Dispense Refill   AMBULATORY NON FORMULARY MEDICATION Medication Name: CPAP with tubing and mask and humidifier set to AutoPap between 4 and 16 cm of water pressure.  Please fax download after 10 days, to [phone] 1 Units 0   apixaban (ELIQUIS) 5 MG TABS tablet TAKE 1 TABLET(5 MG) BY MOUTH TWICE DAILY 60 tablet 5   dapagliflozin propanediol (FARXIGA) 10 MG TABS tablet Take 1 tablet (10 mg total) by mouth daily before breakfast. 30 tablet 8   dofetilide (TIKOSYN) 500 MCG capsule TAKE 1 CAPSULE(500 MCG) BY MOUTH TWICE DAILY 60 capsule 11   metoprolol succinate (TOPROL-XL) 100 MG 24 hr tablet Take 1 tablet (100 mg total) by mouth daily. Take with or immediately following a meal. 90 tablet 3   Multiple Vitamin (MULTIVITAMIN WITH MINERALS) TABS tablet Take 1 tablet by mouth daily.     Multiple Vitamins-Minerals (AIRBORNE PO) Take 1 packet by mouth daily.     sacubitril-valsartan (ENTRESTO) 97-103 MG Take 1 tablet by mouth 2 (two) times daily. 60 tablet 6   spironolactone (ALDACTONE) 25 MG tablet Take 1/2 tablets (12.5 mg total) by mouth daily. 45 tablet 3   torsemide (DEMADEX) 20 MG tablet TAKE 1 TO 2 TABLETS(20 TO 40 MG) BY MOUTH DAILY AS NEEDED 30 tablet 0   No current facility-administered medications for this visit.     Review of Systems    ***.  All other systems reviewed and are otherwise negative except as noted above.    Physical Exam    VS:  There were no vitals taken for this visit. , BMI There is no height or weight on file to calculate BMI.     GEN: Well nourished, well developed, in no acute distress. HEENT: normal. Neck: Supple, no JVD, carotid bruits, or masses. Cardiac: RRR, no murmurs, rubs, or gallops. No clubbing, cyanosis, edema.   Radials/DP/PT 2+ and equal bilaterally.  Respiratory:  Respirations regular and unlabored, clear to auscultation bilaterally. GI: Soft, nontender, nondistended, BS + x 4. MS: no deformity or atrophy. Skin: warm and dry, no rash. Neuro:  Strength and sensation are intact. Psych: Normal affect.  Accessory Clinical Findings    ECG personally reviewed by me today - *** - no acute changes.  Lab Results  Component Value Date   WBC 7.4 01/30/2021   HGB 13.7 01/30/2021   HCT 42.7 01/30/2021   MCV 94.1 01/30/2021   PLT 185 01/30/2021   Lab Results  Component Value Date   CREATININE 1.05 05/15/2021   BUN 24 05/15/2021   NA 138 05/15/2021   K 4.6 05/15/2021   CL 102 05/15/2021   CO2 18 (L) 05/15/2021   Lab Results  Component Value Date   ALT 34 01/27/2021   AST 25 01/27/2021   ALKPHOS 48 01/27/2021   BILITOT 0.5 01/27/2021   Lab Results  Component Value Date   CHOL 153 01/28/2021   HDL 28 (L)  01/28/2021   LDLCALC 92 01/28/2021   TRIG 165 (H) 01/28/2021   CHOLHDL 5.5 01/28/2021    Lab Results  Component Value Date   HGBA1C 4.7 (L) 01/28/2021    Assessment & Plan    1.  ***   Lenna Sciara, NP 09/23/2021, 7:22 AM

## 2021-09-24 ENCOUNTER — Ambulatory Visit (HOSPITAL_BASED_OUTPATIENT_CLINIC_OR_DEPARTMENT_OTHER): Payer: Commercial Managed Care - PPO | Admitting: General Practice

## 2021-10-02 ENCOUNTER — Encounter: Payer: Self-pay | Admitting: Family Medicine

## 2021-10-02 MED ORDER — NIRMATRELVIR/RITONAVIR (PAXLOVID)TABLET: 3.0000 | ORAL_TABLET | Freq: Two times a day (BID) | ORAL | 0 refills | Status: AC

## 2021-10-23 NOTE — Progress Notes (Addendum)
Office Visit    Patient Name: Joe Morrison Date of Encounter: 10/24/2021  PCP:  Hali Marry, MD   [place]  Cardiologist:  Freada Bergeron, MD  Advanced Practice Provider:  No care team member to display Electrophysiologist:  Cristopher Peru, MD     Chief Complaint    Joe Morrison is a 56 y.o. male with a hx of atrial fibrillation on Tikosyn, combined systolic and diastolic heart failure, hypertension, severe aortic regurgitation, morbid obesity, OSA  presents today for follow up after cardiac MRI.    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 Echo 4/22 LVEF 35-40%, unable assess RWMA and diastolic function, RV normal, LA severely dilated,  mod RAE, mild dilatation of the aortic root, measuring 46 mm, mild dilatation of the ascending aorta, 41 mm; b. Echo 7/22 improved EF of 45 to 50% up from 35 to 40%, apical hypokinesis, severe LAE, moderate RAE, moderate AV regurgitation, mod dilatation of the ascendig aorta, 42 mm.   Former tobacco use    Hypertension    Morbid obesity (HCC)    OSA (obstructive sleep apnea)    Persistent atrial fibrillation ([place])    S/p failed DCCV 4/22, on Tikosyn c/ restoration of NSR, Eliquis.   Severe aortic regurgitation    a. Echo 7/22 c/ mod AR; b. cMRI 9/22 c/ severe AR.   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   Past Surgical History:  Procedure Laterality Date   CARDIOVERSION N/A 01/29/2021   Procedure: CARDIOVERSION;  Surgeon: Josue Hector, MD;  Location: [facility] ENDOSCOPY;  Service: Cardiovascular;  Laterality: N/A;   MOUTH SURGERY     Dental work   TEE WITHOUT CARDIOVERSION N/A 01/29/2021   Procedure: TRANSESOPHAGEAL ECHOCARDIOGRAM (TEE);  Surgeon: Josue Hector, MD;  Location: [facility] ENDOSCOPY;  Service: Cardiovascular;  Laterality: N/A;    TONSILLECTOMY  1972    Allergies  No Known Allergies  History of Present Illness   Joe Morrison is a 56 y.o. male with a hx of atrial fibrillation on Tikosyn, combined systolic and diastolic heart failure, hypertension, severe aortic regurgitation, morbid obesity, OSA last seen 04/28/2021 by Dr. Johney Frame.   He was admitted April 2022 after presenting with worsening shortness of breath volume overload.  Found of new onset atrial fibrillation with RVR as well as acute systolic and diastolic heart failure LVEF 35 to 40%.  He was diuresed with improvement underwent DCCV however failed to return to normal sinus rhythm.  EP was consulted and he was subsequently was started on Tikosyn with successful return to NSR.  TTE 04/21/2021 LVEF 45 to 50%, moderately dilated LV, mild LVH, severe LAE, moderate AI, ascending aorta 42 mm.  He was recommended for cardiac MRI.   He was seen 04/28/2021 by Dr. Johney Frame feeling overall well.  Did note episodes of anxiety where he felt like he was struggling to breathe with fear of going into atrial fibrillation.  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  Blood pressure well controlled.  Requiring torsemide only 1-2 times per week.   He had cardiac MRI 06/21/2021 showing LVEF had returned to normal with LVEF 54%, severe AI, dilation of aortic root 46 mm, dilation of ascending aorta 41 mm, moderate TR, mild MR.   He presents  today for follow-up. Since last seen he has gotten to do some travel [place] and [place]. Tells me since adjustments to his medications over the last year he feels remarkably better. He got a CPAP one month ago Reports no shortness of breath at rest nor dyspnea on exertion. He is taking his Torsemide PRN as directed. Notes a difference when he chnages his diet. For example, over the holidays took it 3 times per week. Most often 1-2 times per week. He works out regularly with a Physiological scientist. He and his primary care provider have discussed possibly  utilizing Ozempic. He prefers to trial continuing working out first. BP at home routinely 130/80. Notes he gets anxious at the doctor's office and BP often reads high. Notes no lightheadedness, dizziness, near syncope, syncope. Denies palpitations.   EKGs/Labs/Other Studies Reviewed:   The following studies were reviewed today: Cardiac MRI 06/20/21 IMPRESSION: 1.  Severe aortic valve regurgitation (regurgitant fraction 51%)   2. Dilatation of aortic root measuring 80mm. Dilatation of ascending aorta measuring 57mm   3. Mild LV dilatation, mild hypertrophy, and normal systolic function (EF 123XX123)   4.  Normal RV size and systolic function (EF 99991111)   5.  No late gadolinium enhancement to suggest myocardial scar   6.  Moderate tricuspid regurgitation (regurgitant fraction 22%)   7.  Mild mitral regurgitation (regurgitant fraction 17%)   TTE 04/21/21: IMPRESSIONS   1. Patient in NSR compared to recent TEE done 01/29/21.   2. Apical hypokinesis . Left ventricular ejection fraction, by  estimation, is 45 to 50%. The left ventricle has mildly decreased  function. The left ventricle demonstrates regional wall motion  abnormalities (see scoring diagram/findings for description).   The left ventricular internal cavity size was moderately dilated. There  is mild left ventricular hypertrophy. Left ventricular diastolic  parameters were normal.   3. Right ventricular systolic function is normal. The right ventricular  size is normal.   4. Left atrial size was severely dilated.   5. The mitral valve is normal in structure. No evidence of mitral valve  regurgitation. No evidence of mitral stenosis.   6. Degree of AR poorly characterized. Appears to be holosystolic flow  reversal in supra sternal images but color flow does not show vena  contracta and incompolete AR doppler signals Can consider another f/u TEE  or other modality like MRI to follow . The   aortic valve was not well visualized.  Aortic valve regurgitation is  moderate. No aortic stenosis is present.   7. Aortic dilatation noted. There is moderate dilatation of the ascending  aorta, measuring 42 mm.   8. The inferior vena cava is normal in size with greater than 50%  respiratory variability, suggesting right atrial pressure of 3 mmHg.   TEE 01/29/2021: 1. [place] attempted x 4 with 200J biphasic and AP manual compression Patient  failed to convert to sinus.   2. Left ventricular ejection fraction, by estimation, is 30 to 35%. The  left ventricle has moderately decreased function. The left ventricle  demonstrates global hypokinesis. The left ventricular internal cavity size  was severely dilated.   3. Right ventricular systolic function is normal. The right ventricular  size is normal.   4. Extensive 3D imaging of LAA performed. Left atrial size was severely  dilated. No left atrial/left atrial appendage thrombus was detected.   5. Right atrial size was mildly dilated.   6. 3 D imaging MV performed . The mitral valve is normal  in structure.  Mild mitral valve regurgitation.   7. Tricuspid valve regurgitation is moderate.   8. The aortic valve is tricuspid. Aortic valve regurgitation is severe.    Echo: 01/28/21 IMPRESSIONS   1. Left ventricular ejection fraction, by estimation, is 35-40%. The left  ventricle has moderately decreased function. Left ventricular endocardial  border not optimally defined to evaluate regional wall motion, even with  Definity contrast. There is mild   concentric left ventricular hypertrophy. Left ventricular diastolic  function could not be evaluated.   2. Right ventricular systolic function is normal. The right ventricular  size is normal. There is mildly elevated pulmonary artery systolic  pressure.   3. Left atrial size was severely dilated.   4. Right atrial size was moderately dilated.   5. The mitral valve is normal in structure. No evidence of mitral valve  regurgitation. No  evidence of mitral stenosis.   6. The aortic valve is normal in structure. Aortic valve regurgitation is  not visualized. No aortic stenosis is present.   7. There is mild dilatation of the aortic root, measuring 46 mm. There is  mild dilatation of the ascending aorta, measuring 41 mm.   8. The inferior vena cava is dilated in size with <50% respiratory  variability, suggesting right atrial pressure of 15 mmHg.     EKG:  EKG is  ordered today.  The ekg ordered today demonstrates NSR 92 bpm IVCD with QTc 467. Stable compared to previous. No acute ST?T wave changes.   Recent Labs: 01/27/2021: ALT 34; B Natriuretic Peptide 475.5 01/28/2021: TSH 2.382 01/30/2021: Hemoglobin 13.7; Platelets 185 03/17/2021: Magnesium 2.3 05/15/2021: BUN 24; Creatinine, Ser 1.05; Potassium 4.6; Sodium 138  Recent Lipid Panel    Component Value Date/Time   CHOL 153 01/28/2021 0039   TRIG 165 (H) 01/28/2021 0039   HDL 28 (L) 01/28/2021 0039   CHOLHDL 5.5 01/28/2021 0039   VLDL 33 01/28/2021 0039   LDLCALC 92 01/28/2021 0039   LDLCALC 127 (H) 12/19/2018 0743    Risk Assessment/Calculations:   CHA2DS2-VASc Score = 2   This indicates a 2.2% annual risk of stroke. The patient's score is based upon: CHF History: 1 HTN History: 1 Diabetes History: 0 Stroke History: 0 Vascular Disease History: 0 Age Score: 0 Gender Score: 0   Home Medications   Current Meds  Medication Sig   AMBULATORY NON FORMULARY MEDICATION Medication Name: CPAP with tubing and mask and humidifier set to AutoPap between 4 and 16 cm of water pressure.  Please fax download after 10 days, to [phone]   apixaban (ELIQUIS) 5 MG TABS tablet TAKE 1 TABLET(5 MG) BY MOUTH TWICE DAILY   dapagliflozin propanediol (FARXIGA) 10 MG TABS tablet Take 1 tablet (10 mg total) by mouth daily before breakfast.   dofetilide (TIKOSYN) 500 MCG capsule TAKE 1 CAPSULE(500 MCG) BY MOUTH TWICE DAILY   metoprolol succinate (TOPROL-XL) 100 MG 24 hr tablet Take 1  tablet (100 mg total) by mouth daily. Take with or immediately following a meal.   Multiple Vitamin (MULTIVITAMIN WITH MINERALS) TABS tablet Take 1 tablet by mouth daily.   Multiple Vitamins-Minerals (AIRBORNE PO) Take 1 packet by mouth daily.   sacubitril-valsartan (ENTRESTO) 97-103 MG Take 1 tablet by mouth 2 (two) times daily.   spironolactone (ALDACTONE) 25 MG tablet Take 1/2 tablets (12.5 mg total) by mouth daily.   torsemide (DEMADEX) 20 MG tablet TAKE 1 TO 2 TABLETS(20 TO 40 MG) BY MOUTH DAILY AS NEEDED  Review of Systems      All other systems reviewed and are otherwise negative except as noted above.  Physical Exam    VS:  BP (!) 142/78    Pulse 98    Ht 5\' 9"  (1.753 m)    Wt (!) 301 lb 12.8 oz (136.9 kg)    BMI 44.57 kg/m  , BMI Body mass index is 44.57 kg/m.  Wt Readings from Last 3 Encounters:  10/24/21 (!) 301 lb 12.8 oz (136.9 kg)  05/11/21 300 lb (136.1 kg)  04/28/21 (!) 307 lb 3.2 oz (139.3 kg)    GEN: Well nourished, well developed, in no acute distress. HEENT: normal. Neck: Supple, no JVD, carotid bruits, or masses. Cardiac: RRR, no murmurs, rubs, or gallops. No clubbing, cyanosis, edema.  Radials/PT 2+ and equal bilaterally.  Respiratory:  Respirations regular and unlabored, clear to auscultation bilaterally. GI: Soft, nontender, nondistended. MS: No deformity or atrophy. Skin: Warm and dry, no rash. Neuro:  Strength and sensation are intact. Psych: Normal affect.  Assessment & Plan      PAF/on Tikosyn/chronic anticoagulation - Maintaining NSR by EKG. Denies palpitations. Continue Toprol at present dose. CHA2DS2-VASc Score = 2 [CHF History: 1, HTN History: 1, Diabetes History: 0, Stroke History: 0, Vascular Disease History: 0, Age Score: 0, Gender Score: 0].  Therefore, the patient's annual risk of stroke is 2.2 %.  Denies bleeding complications.    Combined systolic and diastolic heart failure.  Echo 01/27/2021 LVEF 35-40% in setting of atrial  fibrillation.  Echo 04/2021 LVEF 45 to 50%. MRI 06/2021 LVEF 54%. Euvolemic and well compensated on exam. GDMT includes Farxiga, Entresto, Spironolactone, Torsemide, Metoprolol. Low salt diet, fluid restriction <2 L encouraged. Continue to work with Physiological scientist.    Monomorphic VT- Continue Toprol 100mg  QD.    Aortic regurgitation - Severe by MRI 06/2021. He is asymptomatic with no dyspnea, lightheadedness, volume overload. As he is asymptomatic, no indication for surgical referral.Will repeat echo prior to follow up as will be 6 mos from last imaging.   Hypertension - Well controlled at home. Continue present antihypertensive regimen.    Morbid obesity - Weight loss via diet and exercise encouraged. Discussed the impact being overweight would have on cardiovascular risk. Initiated discussion regarding Ozempic, he prefers to continue working out for a few months and track his progress.   Snoring / OSA - Has had CPA for 1 month and is adjusting. CPAP compliance encouraged.   Disposition: Follow up in 4 month(s) with Freada Bergeron, MD or APP.  Signed, Loel Dubonnet, NP 10/24/2021, 8:10 AM [place]

## 2021-10-24 ENCOUNTER — Other Ambulatory Visit: Payer: Self-pay

## 2021-10-24 ENCOUNTER — Encounter (HOSPITAL_BASED_OUTPATIENT_CLINIC_OR_DEPARTMENT_OTHER): Payer: Self-pay

## 2021-10-24 ENCOUNTER — Ambulatory Visit (INDEPENDENT_AMBULATORY_CARE_PROVIDER_SITE_OTHER): Payer: Commercial Managed Care - PPO | Admitting: Family

## 2021-10-24 ENCOUNTER — Encounter (HOSPITAL_BASED_OUTPATIENT_CLINIC_OR_DEPARTMENT_OTHER): Payer: Self-pay | Admitting: Family

## 2021-10-24 VITALS — BP 142/78 | HR 98 | Ht 69.0 in | Wt 301.8 lb

## 2021-10-24 DIAGNOSIS — I4819 Other persistent atrial fibrillation: Secondary | ICD-10-CM | POA: Diagnosis not present

## 2021-10-24 DIAGNOSIS — Z79899 Other long term (current) drug therapy: Secondary | ICD-10-CM | POA: Diagnosis not present

## 2021-10-24 DIAGNOSIS — I5042 Chronic combined systolic (congestive) and diastolic (congestive) heart failure: Secondary | ICD-10-CM

## 2021-10-24 DIAGNOSIS — I10 Essential (primary) hypertension: Secondary | ICD-10-CM

## 2021-10-24 MED ORDER — APIXABAN 5 MG PO TABS: 5.0000 mg | ORAL_TABLET | Freq: Two times a day (BID) | ORAL | 3 refills | Status: DC

## 2021-10-24 MED ORDER — ENTRESTO 97-103 MG PO TABS: 1.0000 | ORAL_TABLET | Freq: Two times a day (BID) | ORAL | 1 refills | Status: DC

## 2021-10-24 MED ORDER — DAPAGLIFLOZIN PROPANEDIOL 10 MG PO TABS: 10.0000 mg | ORAL_TABLET | Freq: Every day | ORAL | 3 refills | Status: DC

## 2021-10-24 NOTE — Addendum Note (Signed)
Addended by: Loel Dubonnet on: 10/24/2021 02:20 PM   Modules accepted: Orders

## 2021-10-24 NOTE — Patient Instructions (Signed)
Medication Instructions:  Continue your current medications.   *If you need a refill on your cardiac medications before your next appointment, please call your pharmacy*   Lab Work: None ordered today.    Testing/Procedures: Your EKG today shows normal sinus rhythm.    Follow-Up: At [facility], you and your health needs are our priority.  As part of our continuing mission to provide you with exceptional heart care, we have created designated Provider Care Teams.  These Care Teams include your primary Cardiologist (physician) and Advanced Practice Providers (APPs -  Physician Assistants and Nurse Practitioners) who all work together to provide you with the care you need, when you need it.  We recommend signing up for the patient portal called "MyChart".  Sign up information is provided on this After Visit Summary.  MyChart is used to connect with patients for Virtual Visits (Telemedicine).  Patients are able to view lab/test results, encounter notes, upcoming appointments, etc.  Non-urgent messages can be sent to your provider as well.   To learn more about what you can do with MyChart, go to [email or web address].    Your next appointment:   May 12th at 8 AM with Dr. Johney Frame at [facility]   Other Instructions  Heart Healthy Diet Recommendations: A low-salt diet is recommended. Meats should be grilled, baked, or boiled. Avoid fried foods. Focus on lean protein sources like fish or chicken with vegetables and fruits. The American Heart Association is a Microbiologist!  American Heart Association Diet and Lifeystyle Recommendations    Exercise recommendations: The American Heart Association recommends 150 minutes of moderate intensity exercise weekly. Try 30 minutes of moderate intensity exercise 4-5 times per week. This could include walking, jogging, or swimming.

## 2021-10-27 ENCOUNTER — Telehealth (HOSPITAL_BASED_OUTPATIENT_CLINIC_OR_DEPARTMENT_OTHER): Payer: Self-pay | Admitting: Cardiology

## 2021-10-27 ENCOUNTER — Other Ambulatory Visit: Payer: Self-pay

## 2021-10-27 DIAGNOSIS — I4819 Other persistent atrial fibrillation: Secondary | ICD-10-CM

## 2021-10-27 MED ORDER — APIXABAN 5 MG PO TABS: 5.0000 mg | ORAL_TABLET | Freq: Two times a day (BID) | ORAL | 1 refills | Status: DC

## 2021-10-27 NOTE — Telephone Encounter (Signed)
Left message for patient to call and discuss scheduling the Echocardiogram ordered for May 2023

## 2021-10-27 NOTE — Telephone Encounter (Signed)
Eliquis 5 mg refill request received. Patient is 56 years old, weight- 136.9 kg, Crea- 1.05 on 05/15/21, Diagnosis- afib, and last seen by Laurann Montana, PA on 10/24/21. Dose is appropriate based on dosing criteria. Will send in refill to requested pharmacy.

## 2021-10-27 NOTE — Addendum Note (Signed)
Addended by: Marlene Lard on: 10/27/2021 09:03 AM   Modules accepted: Orders

## 2021-11-07 NOTE — Progress Notes (Signed)
PCP:  Agapito Games, MD Primary Cardiologist: Meriam Sprague, MD Electrophysiologist: Lewayne Bunting, MD   Joe Morrison is a 56 y.o. male seen today for Lewayne Bunting, MD for routine electrophysiology followup.  Since last being seen in our clinic the patient reports doing very well. Exercising with a trainer and dieting. Finally got his CPAP after + sleep study in July.  he denies chest pain, palpitations, dyspnea, PND, orthopnea, nausea, vomiting, dizziness, syncope, edema, weight gain, or early satiety.  Past Medical History:  Diagnosis Date   Alcoholic (HCC)    recovering since 10/1999   Combined systolic and diastolic congestive heart failure (HCC)    a. Echo 4/22 LVEF 35-40%, unable assess RWMA and diastolic function, RV normal, LA severely dilated,  mod RAE, mild dilatation of the aortic root, measuring 46 mm, mild dilatation of the ascending aorta, 41 mm; b. Echo 7/22 improved EF of 45 to 50% up from 35 to 40%, apical hypokinesis, severe LAE, moderate RAE, moderate AV regurgitation, mod dilatation of the ascendig aorta, 42 mm.   Former tobacco use    Hypertension    Morbid obesity (HCC)    OSA (obstructive sleep apnea)    Persistent atrial fibrillation (HCC)    S/p failed DCCV 4/22, on Tikosyn c/ restoration of NSR, Eliquis.   Severe aortic regurgitation    a. Echo 7/22 c/ mod AR; b. cMRI 9/22 c/ severe AR.   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   Past Surgical History:  Procedure Laterality Date   CARDIOVERSION N/A 01/29/2021   Procedure: CARDIOVERSION;  Surgeon: Wendall Stade, MD;  Location: [facility] ENDOSCOPY;  Service: Cardiovascular;  Laterality: N/A;   MOUTH SURGERY     Dental work   TEE WITHOUT CARDIOVERSION N/A 01/29/2021   Procedure: TRANSESOPHAGEAL ECHOCARDIOGRAM (TEE);  Surgeon: Wendall Stade, MD;  Location: [facility] ENDOSCOPY;  Service: Cardiovascular;  Laterality: N/A;   TONSILLECTOMY  1972    Current Outpatient Medications   Medication Sig Dispense Refill   AMBULATORY NON FORMULARY MEDICATION Medication Name: CPAP with tubing and mask and humidifier set to AutoPap between 4 and 16 cm of water pressure.  Please fax download after 10 days, to [phone] 1 Units 0   apixaban (ELIQUIS) 5 MG TABS tablet Take 1 tablet (5 mg total) by mouth 2 (two) times daily. TAKE 1 TABLET(5 MG) BY MOUTH TWICE DAILY 180 tablet 1   dapagliflozin propanediol (FARXIGA) 10 MG TABS tablet Take 1 tablet (10 mg total) by mouth daily before breakfast. 90 tablet 3   dofetilide (TIKOSYN) 500 MCG capsule TAKE 1 CAPSULE(500 MCG) BY MOUTH TWICE DAILY 60 capsule 11   metoprolol succinate (TOPROL-XL) 100 MG 24 hr tablet Take 1 tablet (100 mg total) by mouth daily. Take with or immediately following a meal. 90 tablet 3   Multiple Vitamin (MULTIVITAMIN WITH MINERALS) TABS tablet Take 1 tablet by mouth daily.     Multiple Vitamins-Minerals (AIRBORNE PO) Take 1 packet by mouth daily.     sacubitril-valsartan (ENTRESTO) 97-103 MG Take 1 tablet by mouth 2 (two) times daily. 180 tablet 1   spironolactone (ALDACTONE) 25 MG tablet Take 1/2 tablets (12.5 mg total) by mouth daily. 45 tablet 3   torsemide (DEMADEX) 20 MG tablet TAKE 1 TO 2 TABLETS(20 TO 40 MG) BY MOUTH DAILY AS NEEDED 30 tablet 0   No current facility-administered medications for this visit.    No Known Allergies  Social History   Socioeconomic History  Marital status: Single    Spouse name: Not on file   Number of children: 0   Years of education: Not on file   Highest education level: Some college, no degree  Occupational History   Occupation: Engineer, drilling  Tobacco Use   Smoking status: Former    Packs/day: 2.00    Years: 16.00    Pack years: 32.00    Types: Cigarettes    Quit date: 04/27/1999    Years since quitting: 22.5   Smokeless tobacco: Never  Vaping Use   Vaping Use: Never used  Substance and Sexual Activity   Alcohol use: No    Comment: Former  Advice worker in 05/1274. former heavy drinker 20beers/night.   Drug use: Not Currently    Types: Marijuana, MDMA (Ecstacy), Oxycodone    Comment: stopped in 2001   Sexual activity: Not Currently    Partners: Male    Birth control/protection: None  Other Topics Concern   Not on file  Social History Narrative   Working out daily with a Psychologist, educational. No partner   Social Determinants of Corporate investment banker Strain: Low Risk    Difficulty of Paying Living Expenses: Not hard at all  Food Insecurity: No Food Insecurity   Worried About Programme researcher, broadcasting/film/video in the Last Year: Never true   Barista in the Last Year: Never true  Transportation Needs: No Transportation Needs   Lack of Transportation (Medical): No   Lack of Transportation (Non-Medical): No  Physical Activity: Sufficiently Active   Days of Exercise per Week: 3 days   Minutes of Exercise per Session: 60 min  Stress: Stress Concern Present   Feeling of Stress : Rather much  Social Connections: Not on file  Intimate Partner Violence: Not on file     Review of Systems: All other systems reviewed and are otherwise negative except as noted above.  Physical Exam: Vitals:   11/10/21 0757  BP: (!) 142/62  Pulse: 86  SpO2: 98%  Weight: (!) 308 lb 12.8 oz (140.1 kg)  Height: 5\' 9"  (1.753 m)    GEN- The patient is well appearing, alert and oriented x 3 today.   HEENT: normocephalic, atraumatic; sclera clear, conjunctiva pink; hearing intact; oropharynx clear; neck supple, no JVP Lymph- no cervical lymphadenopathy Lungs- Clear to ausculation bilaterally, normal work of breathing.  No wheezes, rales, rhonchi Heart- Regular rate and rhythm, no murmurs, rubs or gallops, PMI not laterally displaced GI- soft, non-tender, non-distended, bowel sounds present, no hepatosplenomegaly Extremities- no clubbing, cyanosis, or edema; DP/PT/radial pulses 2+ bilaterally MS- no significant deformity or atrophy Skin- warm and dry, no  rash or lesion Psych- euthymic mood, full affect Neuro- strength and sensation are intact  EKG is NOT ordered. Personal review of EKG from 10/24/2021 shows NSR with stable QTc  Additional studies reviewed include: Previous EP office notes.   Assessment and Plan:  1. Persistent atrial fibrillation EKG 10/24/21 shows NSR with stable QTc Continue tikosyn 500 mcg BID Continue eliquis. CBC today BMET and Mg today   2. Monomorphic VT Most likely related to CHF. Keep K > 4.0 and Mg > 2.0  EF has improved as below.   3. Chronic systolic CHF Dr. 10/26/21 following. Echo 04/2021 LVEF 45-50% cMRI 06/2021 LVEF 54% Continue GDMT.  Follow up with Dr. 07/2021 in 6 months   Ladona Ridgel, PA-C  11/10/21 8:10 AM

## 2021-11-10 ENCOUNTER — Encounter: Payer: Self-pay | Admitting: Student

## 2021-11-10 ENCOUNTER — Ambulatory Visit (INDEPENDENT_AMBULATORY_CARE_PROVIDER_SITE_OTHER): Payer: Commercial Managed Care - PPO | Admitting: Student

## 2021-11-10 ENCOUNTER — Other Ambulatory Visit: Payer: Self-pay

## 2021-11-10 VITALS — BP 142/62 | HR 86 | Ht 69.0 in | Wt 308.8 lb

## 2021-11-10 DIAGNOSIS — I5042 Chronic combined systolic (congestive) and diastolic (congestive) heart failure: Secondary | ICD-10-CM | POA: Diagnosis not present

## 2021-11-10 DIAGNOSIS — I4819 Other persistent atrial fibrillation: Secondary | ICD-10-CM

## 2021-11-10 DIAGNOSIS — I4729 Other ventricular tachycardia: Secondary | ICD-10-CM

## 2021-11-10 LAB — BASIC METABOLIC PANEL
BUN/Creatinine Ratio: 23 — ABNORMAL HIGH (ref 9–20)
BUN: 36 mg/dL — ABNORMAL HIGH (ref 6–24)
CO2: 21 mmol/L (ref 20–29)
Calcium: 9.6 mg/dL (ref 8.7–10.2)
Chloride: 103 mmol/L (ref 96–106)
Creatinine, Ser: 1.6 mg/dL — ABNORMAL HIGH (ref 0.76–1.27)
Glucose: 79 mg/dL (ref 70–99)
Potassium: 4.2 mmol/L (ref 3.5–5.2)
Sodium: 139 mmol/L (ref 134–144)
eGFR: 50 mL/min/{1.73_m2} — ABNORMAL LOW (ref >59)

## 2021-11-10 LAB — CBC
Hematocrit: 46.8 % (ref 37.5–51.0)
Hemoglobin: 16.3 g/dL (ref 13.0–17.7)
MCH: 32.3 pg (ref 26.6–33.0)
MCHC: 34.8 g/dL (ref 31.5–35.7)
MCV: 93 fL (ref 79–97)
Platelets: 220 10*3/uL (ref 150–450)
RBC: 5.05 x10E6/uL (ref 4.14–5.80)
RDW: 12.8 % (ref 11.6–15.4)
WBC: 6.4 10*3/uL (ref 3.4–10.8)

## 2021-11-10 LAB — MAGNESIUM: Magnesium: 2 mg/dL (ref 1.6–2.3)

## 2021-11-10 NOTE — Patient Instructions (Signed)
Medication Instructions:  Your physician recommends that you continue on your current medications as directed. Please refer to the Current Medication list given to you today.  *If you need a refill on your cardiac medications before your next appointment, please call your pharmacy*   Lab Work: TODAY: BMET, CBC, Mag  If you have labs (blood work) drawn today and your tests are completely normal, you will receive your results only by: MyChart Message (if you have MyChart) OR A paper copy in the mail If you have any lab test that is abnormal or we need to change your treatment, we will call you to review the results.   Follow-Up: At [facility], you and your health needs are our priority.  As part of our continuing mission to provide you with exceptional heart care, we have created designated Provider Care Teams.  These Care Teams include your primary Cardiologist (physician) and Advanced Practice Providers (APPs -  Physician Assistants and Nurse Practitioners) who all work together to provide you with the care you need, when you need it.   Your next appointment:   6 month(s)  The format for your next appointment:   In Person  Provider:   You may see Lewayne Bunting, MD or one of the following Advanced Practice Providers on your designated Care Team:   Francis Dowse, [place] [facility] [place], [place]

## 2021-11-12 ENCOUNTER — Other Ambulatory Visit: Payer: Self-pay

## 2021-11-12 DIAGNOSIS — I5042 Chronic combined systolic (congestive) and diastolic (congestive) heart failure: Secondary | ICD-10-CM

## 2022-02-17 NOTE — Progress Notes (Deleted)
?Cardiology Office Note:   ? ?Date:  02/17/2022  ? ?ID:  Joe Morrison, DOB February 04, 1966, MRN [id number] ? ?PCP:  Agapito Games, MD ?  ?[facility] Providers ?Cardiologist:  Meriam Sprague, MD ?Electrophysiologist:  Lewayne Bunting, MD  ?{ ?   ?Referring MD: Agapito Games, *  ? ? ?History of Present Illness:   ? ?Joe Morrison is a 56 y.o. male with a hx atrial fibrillation with failed DCCV now on tikosyn and  combined systolic and diastolic heart failure, hypertension, severe aortic regurgitation, and morbid obesity who presents to clinic for follow-up. ? ?Patient was admitted from 01/27/21-02/01/21 where he presented with worsening shortness of breath and volume overload found to have new onest atrial fibrillation with RVR as well was new acute systolic and diastolic heart failure with LVEF 35-40%. BNP on admission 475. He was diuresed with improvement and underwent DCCV, however, failed to return to NSR. EP was consulted and he subsequently underwent tikosyn load with successful return to NSR.   ? ?Seen in clinic on 02/25/21 where he was clinically improving. No HF or anginal symptoms. TTE 04/21/21 showed LVEF 45-50%, moderately dilated, mild LVH, severe LAE, at least moderate AR, ascending aorta 72mm. Recommended for CMR which is now pending. ? ?He had cardiac MRI 06/21/2021 showing LVEF had returned to normal with LVEF 54%, severe AI, dilation of aortic root 46 mm, dilation of ascending aorta 41 mm, moderate TR, mild MR. ? ?Was last seen in clinic by Otilio Saber on 10/2021 where he was doing very well. Was exercising and working with a Psychologist, educational. Was started on CPAP for + sleep study.  ? ?Today, *** ? ?Past Medical History:  ?Diagnosis Date  ? Alcoholic (HCC)   ? recovering since 10/1999  ? Combined systolic and diastolic congestive heart failure (HCC)   ? a. Echo 4/22 LVEF 35-40%, unable assess RWMA and diastolic function, RV normal, LA severely dilated,  mod RAE, mild dilatation of the aortic  root, measuring 46 mm, mild dilatation of the ascending aorta, 41 mm; b. Echo 7/22 improved EF of 45 to 50% up from 35 to 40%, apical hypokinesis, severe LAE, moderate RAE, moderate AV regurgitation, mod dilatation of the ascendig aorta, 42 mm.  ? Former tobacco use   ? Hypertension   ? Morbid obesity (HCC)   ? OSA (obstructive sleep apnea)   ? Persistent atrial fibrillation (HCC)   ? S/p failed DCCV 4/22, on Tikosyn c/ restoration of NSR, Eliquis.  ? Severe aortic regurgitation   ? a. Echo 7/22 c/ mod AR; b. cMRI 9/22 c/ severe AR.  ? VT (ventricular tachycardia)   ? monomorphic, followed by EP  ? ? ?Past Surgical History:  ?Procedure Laterality Date  ? CARDIOVERSION N/A 01/29/2021  ? Procedure: CARDIOVERSION;  Surgeon: Wendall Stade, MD;  Location: [facility] ENDOSCOPY;  Service: Cardiovascular;  Laterality: N/A;  ? MOUTH SURGERY    ? Dental work  ? TEE WITHOUT CARDIOVERSION N/A 01/29/2021  ? Procedure: TRANSESOPHAGEAL ECHOCARDIOGRAM (TEE);  Surgeon: Wendall Stade, MD;  Location: [facility] ENDOSCOPY;  Service: Cardiovascular;  Laterality: N/A;  ? TONSILLECTOMY  1972  ? ? ?Current Medications: ?No outpatient medications have been marked as taking for the 02/20/22 encounter (Appointment) with Meriam Sprague, MD.  ?  ? ?Allergies:   Patient has no known allergies.  ? ?Social History  ? ?Socioeconomic History  ? Marital status: Single  ?  Spouse name: Not on file  ? Number of children:  0  ? Years of education: Not on file  ? Highest education level: Some college, no degree  ?Occupational History  ? Occupation: Engineer, drilling  ?Tobacco Use  ? Smoking status: Former  ?  Packs/day: 2.00  ?  Years: 16.00  ?  Pack years: 32.00  ?  Types: Cigarettes  ?  Quit date: 04/27/1999  ?  Years since quitting: 22.8  ? Smokeless tobacco: Never  ?Vaping Use  ? Vaping Use: Never used  ?Substance and Sexual Activity  ? Alcohol use: No  ?  Comment: Former Advice worker in 03/2702. former heavy drinker 20beers/night.  ? Drug use: Not  Currently  ?  Types: Marijuana, MDMA (Ecstacy), Oxycodone  ?  Comment: stopped in 2001  ? Sexual activity: Not Currently  ?  Partners: Male  ?  Birth control/protection: None  ?Other Topics Concern  ? Not on file  ?Social History Narrative  ? Working out daily with a Psychologist, educational. No partner  ? ?Social Determinants of Health  ? ?Financial Resource Strain: Not on file  ?Food Insecurity: Not on file  ?Transportation Needs: Not on file  ?Physical Activity: Not on file  ?Stress: Not on file  ?Social Connections: Not on file  ?  ? ?Family History: ?The patient's family history includes Alcohol abuse in an other family member; Diabetes in his mother; Heart attack (age of onset: 38) in his father; Hypertension in his mother. ? ?ROS:   ?Please see the history of present illness.    ?Review of Systems  ?Constitutional:  Negative for diaphoresis and malaise/fatigue.  ?HENT:  Negative for hearing loss, nosebleeds and sore throat.   ?Eyes:  Negative for blurred vision and double vision.  ?Respiratory:  Negative for sputum production, shortness of breath and wheezing.   ?Cardiovascular:  Negative for chest pain, palpitations, orthopnea, claudication, leg swelling and PND.  ?Gastrointestinal:  Negative for heartburn, nausea and vomiting.  ?Genitourinary:  Negative for dysuria and hematuria.  ?Musculoskeletal:  Negative for falls and joint pain.  ?Neurological:  Negative for dizziness, seizures, loss of consciousness and headaches.  ?Endo/Heme/Allergies:  Does not bruise/bleed easily.  ?Psychiatric/Behavioral:  Negative for depression and memory loss. The patient is nervous/anxious.   ? ? ?EKGs/Labs/Other Studies Reviewed:   ? ?The following studies were reviewed today: ?CMR 06/20/22: ?  ?FINDINGS: ?Left ventricle: ?  ?-Mild hypertrophy ?  ?-Mild dilatation ?  ?-Normal systolic function ?  ?-Normal ECV (28%) ?  ?-No LGE ?  ?LV EF: 54% (Normal 56-78%) ?  ?Absolute volumes: ?  ?LV EDV: (Normal 77-195 mL) ?  ?LV ESV: (Normal  19-72 mL) ?  ?LV SV: (Normal 51-133 mL) ?  ?CO: 9.1L/min (Normal 2.8-8.8 L/min) ?  ?Indexed volumes: ?  ?LV EDV: 118mL/sq-m (Normal 47-92 mL/sq-m) ?  ?LV ESV: 68mL/sq-m (Normal 13-30 mL/sq-m) ?  ?LV SV: 75mL/sq-m (Normal 32-62 mL/sq-m) ?  ?CI: 3.6L/min/sq-m (Normal 1.7-4.2 L/min/sq-m) ?  ?Right ventricle: Normal size and systolic function ?  ?RV EF:  58% (Normal 47-74%) ?  ?Absolute volumes: ?  ?RV EDV: (Normal 88-227 mL) ?  ?RV ESV: 49mL (Normal 23-103 mL) ?  ?RV SV: 55mL (Normal 52-138 mL) ?  ?CO: 5.3L/min (Normal 2.8-8.8 L/min) ?  ?Indexed volumes: ?  ?RV EDV: 48mL/sq-m (Normal 55-105 mL/sq-m) ?  ?RV ESV: 36mL/sq-m (Normal 15-43 mL/sq-m) ?  ?RV SV: 88mL/sq-m (Normal 32-64 mL/sq-m) ?  ?CI: 2.1L/min/sq-m (Normal 1.7-4.2 L/min/sq-m) ?  ?Left atrium: Moderate enlargement ?  ?Right atrium: Normal size ?  ?  Mitral valve: Mild regurgitation (regurgitant fraction 17%) ?  ?Aortic valve: Severe regurgitation (regurgitant fraction 51%) ?  ?Tricuspid valve: Moderate regurgitation (regurgitant fraction 22%) ?  ?Pulmonic valve: No regurgitation ?  ?Aorta: Aortic root measures 46mm, ascending aorta measures 41mm, ?aortic arch measures 35mm, descending aorta measures 26mm ?  ?Pericardium: Normal ?  ?IMPRESSION: ?1.  Severe aortic valve regurgitation (regurgitant fraction 51%) ?  ?2. 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 ?aorta measuring 41mm ?  ?3. Mild LV dilatation, mild hypertrophy, and normal systolic ?function (EF 54%) ?  ?4.  Normal RV size and systolic function (EF 58%) ?  ?5.  No late gadolinium enhancement to suggest myocardial scar ?  ?6.  Moderate tricuspid regurgitation (regurgitant fraction 22%) ?  ?7.  Mild mitral regurgitation (regurgitant fraction 17%) ?TTE 04/21/21: ?IMPRESSIONS  ? 1. Patient in NSR compared to recent TEE done 01/29/21.  ? 2. Apical hypokinesis . Left ventricular ejection fraction, by  ?estimation, is 45 to 50%. The left ventricle has mildly decreased   ?function. The left ventricle demonstrates regional wall motion  ?abnormalities (see scoring diagram/findings for description).  ? The left ventricular internal cavity size was moderately dilated. There  ?is mild

## 2022-02-18 ENCOUNTER — Ambulatory Visit (INDEPENDENT_AMBULATORY_CARE_PROVIDER_SITE_OTHER): Payer: Commercial Managed Care - PPO

## 2022-02-18 DIAGNOSIS — I5042 Chronic combined systolic (congestive) and diastolic (congestive) heart failure: Secondary | ICD-10-CM | POA: Diagnosis not present

## 2022-02-18 LAB — ECHOCARDIOGRAM COMPLETE
AR max vel: 4.3 cm2
AV Area VTI: 4.68 cm2
AV Area mean vel: 4.51 cm2
AV Mean grad: 4 mmHg
AV Peak grad: 10.6 mmHg
AV Vena cont: 0.32 cm
Ao pk vel: 1.63 m/s
Area-P 1/2: 3.53 cm2
Calc EF: 50.7 %
P 1/2 time: 342 msec
S' Lateral: 3.02 cm
Single Plane A2C EF: 54.5 %
Single Plane A4C EF: 51 %

## 2022-02-18 MED ORDER — PERFLUTREN LIPID MICROSPHERE
1.0000 mL | INTRAVENOUS | Status: AC | PRN
  Administered 2022-02-18: 2 mL via INTRAVENOUS

## 2022-02-19 ENCOUNTER — Telehealth (HOSPITAL_BASED_OUTPATIENT_CLINIC_OR_DEPARTMENT_OTHER): Payer: Self-pay

## 2022-02-19 DIAGNOSIS — I77810 Thoracic aortic ectasia: Secondary | ICD-10-CM

## 2022-02-19 NOTE — Telephone Encounter (Signed)
-----   Message from Ronney Asters, NP sent at 02/19/2022  8:02 AM EDT ----- ?Please contact Joe Morrison and let him know that his echocardiogram has been reviewed.  It showed improved pumping function.  Grade 1 diastolic dysfunction was noted.  He was noted to have aortic dilation with moderate dilation of his aortic root measuring 47 mm.  His aortic valve regurgitation was noted to be severe.  As long as he continues to be symptom-free we will plan for repeat echocardiogram in 6 months.  Please advised him that if he starts to develop shortness of breath, fatigue, activity intolerance to contact cardiology office.  Thank you. ?

## 2022-02-20 ENCOUNTER — Ambulatory Visit (INDEPENDENT_AMBULATORY_CARE_PROVIDER_SITE_OTHER): Payer: Commercial Managed Care - PPO | Admitting: Cardiology

## 2022-02-20 ENCOUNTER — Telehealth: Payer: Self-pay | Admitting: *Deleted

## 2022-02-20 ENCOUNTER — Encounter: Payer: Self-pay | Admitting: Cardiology

## 2022-02-20 VITALS — BP 148/74 | HR 82 | Ht 69.0 in | Wt 299.2 lb

## 2022-02-20 DIAGNOSIS — I4819 Other persistent atrial fibrillation: Secondary | ICD-10-CM

## 2022-02-20 DIAGNOSIS — I10 Essential (primary) hypertension: Secondary | ICD-10-CM

## 2022-02-20 DIAGNOSIS — I77810 Thoracic aortic ectasia: Secondary | ICD-10-CM

## 2022-02-20 DIAGNOSIS — I712 Thoracic aortic aneurysm, without rupture, unspecified: Secondary | ICD-10-CM

## 2022-02-20 DIAGNOSIS — I351 Nonrheumatic aortic (valve) insufficiency: Secondary | ICD-10-CM | POA: Diagnosis not present

## 2022-02-20 DIAGNOSIS — Z79899 Other long term (current) drug therapy: Secondary | ICD-10-CM

## 2022-02-20 DIAGNOSIS — I5042 Chronic combined systolic (congestive) and diastolic (congestive) heart failure: Secondary | ICD-10-CM

## 2022-02-20 DIAGNOSIS — I4729 Other ventricular tachycardia: Secondary | ICD-10-CM

## 2022-02-20 MED ORDER — SPIRONOLACTONE 25 MG PO TABS: 25.0000 mg | ORAL_TABLET | Freq: Every day | ORAL | 2 refills | Status: DC

## 2022-02-20 NOTE — Telephone Encounter (Signed)
RE: needs CT ANGIO CHEST AORTA GATED DONE VERY SOON FOR SURGICAL WORK UP ?Received: Today ?Lennie Odor, RN   [facility] [address] [facility]; [place], [place], [place] ?Forwarding to [place] to schedule.  ?[place]  ?

## 2022-02-20 NOTE — Patient Instructions (Signed)
Medication Instructions:  ? ?INCREASE YOUR SPIRONOLACTONE TO 25 MG BY MOUTH DAILY ? ?*If you need a refill on your cardiac medications before your next appointment, please call your pharmacy* ? ? ?You have been referred to SEE DR. BARTLE AT [facility])  FOR SEVERE AORTIC REGURGITATION  ? ? ?Lab Work: ? ?IN ONE WEEK HERE IN THE OFFICE--CHECK BMET AND LIPIDS ? ?If you have labs (blood work) drawn today and your tests are completely normal, you will receive your results only by: ?MyChart Message (if you have MyChart) OR ?A paper copy in the mail ?If you have any lab test that is abnormal or we need to change your treatment, we will call you to review the results. ? ? ? ?Follow-Up: ? ?3-4 MONTHS IN THE OFFICE WITH DR. Johney Frame ? ? ?Important Information About Sugar ? ? ? ? ? ? ?

## 2022-02-20 NOTE — Progress Notes (Signed)
?Cardiology Office Note:   ? ?Date:  02/20/2022  ? ?ID:  Joe Morrison, DOB 18-Jan-1966, MRN [id number] ? ?PCP:  Hali Marry, MD ?  ?[facility] Providers ?Cardiologist:  Freada Bergeron, MD ?Electrophysiologist:  Cristopher Peru, MD  ?{ ?   ?Referring MD: Hali Marry, *  ? ? ?History of Present Illness:   ? ?Joe Morrison is a 56 y.o. male with a hx atrial fibrillation with failed DCCV now on tikosyn and  combined systolic and diastolic heart failure, hypertension, severe aortic regurgitation, and morbid obesity who presents to clinic for follow-up. ? ?Patient was admitted from 01/27/21-02/01/21 where he presented with worsening shortness of breath and volume overload found to have new onest atrial fibrillation with RVR as well was new acute systolic and diastolic heart failure with LVEF 35-40%. BNP on admission 475. He was diuresed with improvement and underwent DCCV, however, failed to return to NSR. EP was consulted and he subsequently underwent tikosyn load with successful return to NSR.   ? ?Seen in clinic on 02/25/21 where he was clinically improving. No HF or anginal symptoms. TTE 04/21/21 showed LVEF 45-50%, moderately dilated, mild LVH, severe LAE, at least moderate AR, ascending aorta 66mm. Recommended for CMR which is now pending. ? ?He had cardiac MRI 06/21/2021 showing LVEF had returned to normal with LVEF 54%, severe AI, dilation of aortic root 46 mm, dilation of ascending aorta 41 mm, moderate TR, mild MR. ? ?Was last seen in clinic by Oda Kilts on 10/2021 where he was doing very well. Was exercising and working with a Clinical research associate. Was started on CPAP for + sleep study.  ? ?Today, the patient states that he is doing well. He has remained active and exercises regularly with a Physiological scientist. He has slowly lost weight. He states he feels good with exertion, but can start to feel like he really needs to rest to catch his breath once he hits a certain threshold. At home his blood  pressure is typically in the mid 130s/70s. In clinic his BP is 148/74 which he attributes to white coat syndrome. ? ?Every 3-4 months he has a dull ache surrounding his heart that he would not describe as a pain. This occurs randomly and is sporadic. No episodes with exertion. ? ?Has rare LE edema. Takes torsemide 1-2x/week. No orthopnea, PND.  ? ?He denies any palpitations, or shortness of breath. No lightheadedness, headaches, syncope, orthopnea, or PND. ? ?Past Medical History:  ?Diagnosis Date  ? 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 Combined systolic and diastolic congestive heart failure ([place])   ? a. Echo 4/22 LVEF 35-40%, unable assess RWMA and diastolic function, RV normal, LA severely dilated,  mod RAE, mild dilatation of the aortic root, measuring 46 mm, mild dilatation of the ascending aorta, 41 mm; b. Echo 7/22 improved EF of 45 to 50% up from 35 to 40%, apical hypokinesis, severe LAE, moderate RAE, moderate AV regurgitation, mod dilatation of the ascendig aorta, 42 mm.  ? Former tobacco use   ? Hypertension   ? Morbid obesity ([place])   ? OSA (obstructive sleep apnea)   ? Persistent atrial fibrillation ([place])   ? S/p failed DCCV 4/22, on Tikosyn c/ restoration of NSR, Eliquis.  ? Severe aortic regurgitation   ? a. Echo 7/22 c/ mod AR; b. cMRI 9/22 c/ severe AR.  ? VT (ventricular tachycardia) ([place])   ? monomorphic, followed by EP  ? ? ?Past Surgical History:  ?  Procedure Laterality Date  ? CARDIOVERSION N/A 01/29/2021  ? Procedure: CARDIOVERSION;  Surgeon: Josue Hector, MD;  Location: [facility] ENDOSCOPY;  Service: Cardiovascular;  Laterality: N/A;  ? MOUTH SURGERY    ? Dental work  ? TEE WITHOUT CARDIOVERSION N/A 01/29/2021  ? Procedure: TRANSESOPHAGEAL ECHOCARDIOGRAM (TEE);  Surgeon: Josue Hector, MD;  Location: [facility] ENDOSCOPY;  Service: Cardiovascular;  Laterality: N/A;  ? [place]  ? ? ?Current Medications: ?Current Meds  ?Medication Sig  ? AMBULATORY NON FORMULARY MEDICATION Medication  Name: CPAP with tubing and mask and humidifier set to AutoPap between 4 and 16 cm of water pressure.  Please fax download after 10 days, to [phone]  ? apixaban (ELIQUIS) 5 MG TABS tablet Take 1 tablet (5 mg total) by mouth 2 (two) times daily. TAKE 1 TABLET(5 MG) BY MOUTH TWICE DAILY  ? dapagliflozin propanediol (FARXIGA) 10 MG TABS tablet Take 1 tablet (10 mg total) by mouth daily before breakfast.  ? dofetilide (TIKOSYN) 500 MCG capsule TAKE 1 CAPSULE(500 MCG) BY MOUTH TWICE DAILY  ? metoprolol succinate (TOPROL-XL) 100 MG 24 hr tablet Take 1 tablet (100 mg total) by mouth daily. Take with or immediately following a meal.  ? Multiple Vitamin (MULTIVITAMIN WITH MINERALS) TABS tablet Take 1 tablet by mouth daily.  ? Multiple Vitamins-Minerals (AIRBORNE PO) Take 1 packet by mouth daily.  ? sacubitril-valsartan (ENTRESTO) 97-103 MG Take 1 tablet by mouth 2 (two) times daily.  ? spironolactone (ALDACTONE) 25 MG tablet Take 1 tablet (25 mg total) by mouth daily.  ? torsemide (DEMADEX) 20 MG tablet TAKE 1 TO 2 TABLETS(20 TO 40 MG) BY MOUTH DAILY AS NEEDED  ? [DISCONTINUED] spironolactone (ALDACTONE) 25 MG tablet Take 1/2 tablets (12.5 mg total) by mouth daily.  ?  ? ?Allergies:   Patient has no known allergies.  ? ?Social History  ? ?Socioeconomic History  ? Marital status: Single  ?  Spouse name: Not on file  ? Number of children: 0  ? Years of education: Not on file  ? Highest education level: Some college, no degree  ?Occupational History  ? Occupation: Office manager  ?Tobacco Use  ? Smoking status: Former  ?  Packs/day: 2.00  ?  Years: 16.00  ?  Pack years: 32.00  ?  Types: Cigarettes  ?  Quit date: 04/27/1999  ?  Years since quitting: 22.8  ? Smokeless tobacco: Never  ?Vaping Use  ? Vaping Use: Never used  ?Substance and Sexual Activity  ? Alcohol use: No  ?  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 former heavy drinker 20beers/night.  ? Drug use: Not Currently  ?  Types: Marijuana, MDMA (Ecstacy),  Oxycodone  ?  Comment: stopped in 2001  ? Sexual activity: Not Currently  ?  Partners: Male  ?  Birth control/protection: None  ?Other Topics Concern  ? Not on file  ?Social History Narrative  ? Working out daily with a Clinical research associate. No partner  ? ?Social Determinants of Health  ? ?Financial Resource Strain: Not on file  ?Food Insecurity: Not on file  ?Transportation Needs: Not on file  ?Physical Activity: Not on file  ?Stress: Not on file  ?Social Connections: Not on file  ?  ? ?Family History: ?The patient's family history includes Alcohol abuse in an other family member; Diabetes in his mother; Heart attack (age of onset: 14) in his father; Hypertension in his mother. ? ?ROS:   ?Please see the history of present illness.    ?Review  of Systems  ?Constitutional:  Negative for diaphoresis and malaise/fatigue.  ?HENT:  Negative for hearing loss, nosebleeds and sore throat.   ?Eyes:  Negative for blurred vision and double vision.  ?Respiratory:  Negative for sputum production, shortness of breath and wheezing.   ?Cardiovascular:  Positive for chest pain (Dull ache) and leg swelling (occasional). Negative for palpitations, orthopnea, claudication and PND.  ?Gastrointestinal:  Negative for heartburn, nausea and vomiting.  ?Genitourinary:  Negative for dysuria and hematuria.  ?Musculoskeletal:  Negative for falls and joint pain.  ?Neurological:  Negative for dizziness, seizures, loss of consciousness and headaches.  ?Endo/Heme/Allergies:  Does not bruise/bleed easily.  ?Psychiatric/Behavioral:  Negative for depression and memory loss. The patient is nervous/anxious.   ? ? ?EKGs/Labs/Other Studies Reviewed:   ? ?The following studies were reviewed today: ? ?Echo 02/18/2022: ?Sonographer Comments: Patient is morbidly obese, suboptimal parasternal  ?window and suboptimal apical window. Image acquisition challenging due to  ?patient body habitus.  ?IMPRESSIONS  ? ? 1. Left ventricular ejection fraction, by estimation, is 50 to  55%. The  ?left ventricle has low normal function. The left ventricle has no regional  ?wall motion abnormalities. There is severe left ventricular hypertrophy.  ?Left ventricular diastolic  ?parameters are

## 2022-02-20 NOTE — Telephone Encounter (Signed)
CT ANGIO CHEST AORTA W/WO CONTRAST GATED order was placed.  We obtained a bmet on the pt here in clinic today. ? ?Pt aware of test needed and aware that he needs to get an appt to see his Dentist for a check-up and work-up for surgical consult.  ?Pt states he will call his dentist today or early next week to get this appt.  ? ?Pt aware I will send a message to our [facility] Schedulers to call him back and arrange this CT Angio.   ?Pt verbalized understanding and agrees with this plan. ?

## 2022-02-20 NOTE — Telephone Encounter (Signed)
-----   Message from Freada Bergeron, MD sent at 02/20/2022 11:23 AM EDT ----- ?Regarding: FW: referral ? ?----- Message ----- ?From: Marylen Ponto, LPN ?Sent: 02/20/2022  10:26 AM EDT ?To: Laury Deep, RN, Freada Bergeron, MD ?Subject: referral                                      ? ?Hi Dr Johney Frame, ?Dr Cyndia Bent reviewed the referral on Mr Gerbino, and he is needing a CTA Chest with gating to eval the TAA. Prior to seeing him for surgical consult,  ?And also a dental consult would be helpful. Can your office set this test up for patient. Thanks for your help. ? ? ?

## 2022-02-21 ENCOUNTER — Other Ambulatory Visit: Payer: Self-pay

## 2022-02-21 ENCOUNTER — Emergency Department (HOSPITAL_BASED_OUTPATIENT_CLINIC_OR_DEPARTMENT_OTHER): Payer: Commercial Managed Care - PPO

## 2022-02-21 ENCOUNTER — Emergency Department (HOSPITAL_BASED_OUTPATIENT_CLINIC_OR_DEPARTMENT_OTHER)
Admission: EM | Admit: 2022-02-21 | Discharge: 2022-02-21 | Disposition: A | Discharge status: Home / Self Care | Payer: Commercial Managed Care - PPO | Attending: Emergency Medicine | Admitting: Emergency Medicine

## 2022-02-21 ENCOUNTER — Emergency Department
Admission: EM | Admit: 2022-02-21 | Discharge: 2022-02-21 | Discharge status: Absent without leave | Payer: Commercial Managed Care - PPO | Source: Home / Self Care

## 2022-02-21 ENCOUNTER — Encounter (HOSPITAL_BASED_OUTPATIENT_CLINIC_OR_DEPARTMENT_OTHER): Payer: Self-pay | Admitting: Emergency Medicine

## 2022-02-21 DIAGNOSIS — R002 Palpitations: Secondary | ICD-10-CM | POA: Diagnosis present

## 2022-02-21 DIAGNOSIS — I351 Nonrheumatic aortic (valve) insufficiency: Secondary | ICD-10-CM | POA: Insufficient documentation

## 2022-02-21 DIAGNOSIS — I509 Heart failure, unspecified: Secondary | ICD-10-CM | POA: Diagnosis not present

## 2022-02-21 DIAGNOSIS — Z7901 Long term (current) use of anticoagulants: Secondary | ICD-10-CM | POA: Diagnosis not present

## 2022-02-21 DIAGNOSIS — R55 Syncope and collapse: Secondary | ICD-10-CM | POA: Insufficient documentation

## 2022-02-21 LAB — CBC
HCT: 47.3 % (ref 39.0–52.0)
Hemoglobin: 16.7 g/dL (ref 13.0–17.0)
MCH: 32.8 pg (ref 26.0–34.0)
MCHC: 35.3 g/dL (ref 30.0–36.0)
MCV: 92.9 fL (ref 80.0–100.0)
Platelets: 188 10*3/uL (ref 150–400)
RBC: 5.09 MIL/uL (ref 4.22–5.81)
RDW: 12.6 % (ref 11.5–15.5)
WBC: 7.5 10*3/uL (ref 4.0–10.5)
nRBC: 0 % (ref 0.0–0.2)

## 2022-02-21 LAB — COMPREHENSIVE METABOLIC PANEL
ALT: 22 U/L (ref 0–44)
AST: 24 U/L (ref 15–41)
Albumin: 4.4 g/dL (ref 3.5–5.0)
Alkaline Phosphatase: 49 U/L (ref 38–126)
Anion gap: 10 (ref 5–15)
BUN: 23 mg/dL — ABNORMAL HIGH (ref 6–20)
CO2: 22 mmol/L (ref 22–32)
Calcium: 9.6 mg/dL (ref 8.9–10.3)
Chloride: 105 mmol/L (ref 98–111)
Creatinine, Ser: 1.26 mg/dL — ABNORMAL HIGH (ref 0.61–1.24)
GFR, Estimated: >60 mL/min (ref >60)
Glucose, Bld: 130 mg/dL — ABNORMAL HIGH (ref 70–99)
Potassium: 3.6 mmol/L (ref 3.5–5.1)
Sodium: 137 mmol/L (ref 135–145)
Total Bilirubin: 0.6 mg/dL (ref 0.3–1.2)
Total Protein: 8 g/dL (ref 6.5–8.1)

## 2022-02-21 LAB — D-DIMER, QUANTITATIVE: D-Dimer, Quant: <0.27 ug/mL-FEU (ref 0.00–0.50)

## 2022-02-21 LAB — TROPONIN I (HIGH SENSITIVITY)
Troponin I (High Sensitivity): 8 ng/L (ref <18)
Troponin I (High Sensitivity): 8 ng/L (ref <18)

## 2022-02-21 MED ORDER — IOHEXOL 350 MG/ML SOLN
100.0000 mL | Freq: Once | INTRAVENOUS | Status: AC | PRN
  Administered 2022-02-21: 100 mL via INTRAVENOUS

## 2022-02-21 NOTE — ED Triage Notes (Signed)
Felt syncopal and then felt fluttering in upper abd saw cards  dr yesterday , has hx of a fib and cardiac regurge ?

## 2022-02-21 NOTE — ED Notes (Signed)
Spoke with Carol in lab to add on d dimer 

## 2022-02-21 NOTE — ED Provider Notes (Signed)
?[place] EMERGENCY DEPARTMENT ?Provider Note ? ? ?CSN: [id number] ?Arrival date & time: 02/21/22  1250 ? ?  ? ?History ? ?Chief Complaint  ?Patient presents with  ? Chest Pain  ? ? ?Joe Morrison is a 56 y.o. male. ? ? ?Chest Pain ?Associated symptoms: palpitations   ? ?56 year old male with medical history significant for ascending thoracic aortic aneurysm, CHF, atrial fibrillation on Tikosyn and Eliquis, severe aortic regurgitation who presents to the emergency department with a near syncopal episode and palpitations.  The patient was last seen by his cardiologist yesterday in clinic.  He last had an echocardiogram on 02/18/2022 which revealed a low normal EF of 50 to 55%, severe aortic regurgitation present, aortic aneurysm of the ascending thoracic aorta measuring 4.7 cm.  A referral was placed for follow-up with CT surgery.  The patient states that he was able to schedule an appointment for follow-up in early June.  He states that he was driving this morning and developed an episode of palpitations.  He took his Eliquis and metoprolol earlier today.  There was some associated sensation of chest discomfort "feeling funny" which has subsequently resolved.  The patient felt lightheaded at the time and felt as if he was going to pass out.  He has had some symptoms associated with his aortic regurgitation and is wondering if this is similar. ? ?Home Medications ?Prior to Admission medications   ?Medication Sig Start Date End Date Taking? Authorizing Provider  ?AMBULATORY NON FORMULARY MEDICATION Medication Name: CPAP with tubing and mask and humidifier set to AutoPap between 4 and 16 cm of water pressure.  Please fax download after 10 days, to [phone] 09/09/21   Hali Marry, MD  ?apixaban (ELIQUIS) 5 MG TABS tablet Take 1 tablet (5 mg total) by mouth 2 (two) times daily. TAKE 1 TABLET(5 MG) BY MOUTH TWICE DAILY 10/27/21   Loel Dubonnet, NP  ?dapagliflozin propanediol (FARXIGA) 10 MG  TABS tablet Take 1 tablet (10 mg total) by mouth daily before breakfast. 10/24/21   Loel Dubonnet, NP  ?dofetilide (TIKOSYN) 500 MCG capsule TAKE 1 CAPSULE(500 MCG) BY MOUTH TWICE DAILY 04/02/21   Freada Bergeron, MD  ?metoprolol succinate (TOPROL-XL) 100 MG 24 hr tablet Take 1 tablet (100 mg total) by mouth daily. Take with or immediately following a meal. 04/28/21   Freada Bergeron, MD  ?Multiple Vitamin (MULTIVITAMIN WITH MINERALS) TABS tablet Take 1 tablet by mouth daily.    [provider]  ?Multiple Vitamins-Minerals (AIRBORNE PO) Take 1 packet by mouth daily.    [provider]  ?sacubitril-valsartan (ENTRESTO) 97-103 MG Take 1 tablet by mouth 2 (two) times daily. 10/24/21   Loel Dubonnet, NP  ?spironolactone (ALDACTONE) 25 MG tablet Take 1 tablet (25 mg total) by mouth daily. 02/20/22   Freada Bergeron, MD  ?torsemide (DEMADEX) 20 MG tablet TAKE 1 TO 2 TABLETS(20 TO 40 MG) BY MOUTH DAILY AS NEEDED 08/11/21   Larey Dresser, MD  ?   ? ?Allergies    ?Patient has no known allergies.   ? ?Review of Systems   ?Review of Systems  ?Cardiovascular:  Positive for chest pain and palpitations.  ?Neurological:  Positive for light-headedness.  ?All other systems reviewed and are negative. ? ?Physical Exam ?Updated Vital Signs ?BP 124/86   Pulse 80   Temp 98.1 ?F (36.7 ?C) (Oral)   Resp 20   Ht 5\' 9"  (1.753 m)   Wt 135.7 kg  SpO2 98%   BMI 44.20 kg/m?  ?Physical Exam ?Vitals and nursing note reviewed.  ?Constitutional:   ?   General: He is not in acute distress. ?   Appearance: He is well-developed. He is obese.  ?HENT:  ?   Head: Normocephalic and atraumatic.  ?Eyes:  ?   Conjunctiva/sclera: Conjunctivae normal.  ?Cardiovascular:  ?   Rate and Rhythm: Normal rate and regular rhythm.  ?   Pulses: Normal pulses.  ?   Heart sounds: Murmur heard.  ?Pulmonary:  ?   Effort: Pulmonary effort is normal. No respiratory distress.  ?   Breath sounds: Normal breath sounds.  ?Abdominal:   ?   Palpations: Abdomen is soft.  ?   Tenderness: There is no abdominal tenderness.  ?Musculoskeletal:     ?   General: No swelling.  ?   Cervical back: Neck supple.  ?   Right lower leg: No edema.  ?   Left lower leg: No edema.  ?Skin: ?   General: Skin is warm and dry.  ?   Capillary Refill: Capillary refill takes less than 2 seconds.  ?Neurological:  ?   Mental Status: He is alert.  ?Psychiatric:     ?   Mood and Affect: Mood normal.  ? ? ?ED Results / Procedures / Treatments   ?Labs ?(all labs ordered are listed, but only abnormal results are displayed) ?Labs Reviewed  ?COMPREHENSIVE METABOLIC PANEL - Abnormal; Notable for the following components:  ?    Result Value  ? Glucose, Bld 130 (*)   ? BUN 23 (*)   ? Creatinine, Ser 1.26 (*)   ? All other components within normal limits  ?CBC  ?D-DIMER, QUANTITATIVE  ?TROPONIN I (HIGH SENSITIVITY)  ?TROPONIN I (HIGH SENSITIVITY)  ? ? ?EKG ?EKG Interpretation ? ?Date/Time:  Saturday Feb 21 2022 12:58:29 EDT ?Ventricular Rate:  101 ?PR Interval:  202 ?QRS Duration: 90 ?QT Interval:  364 ?QTC Calculation: 471 ?R Axis:   18 ?Text Interpretation: Sinus tachycardia Low voltage QRS Cannot rule out Anterior infarct , age undetermined Abnormal ECG When compared with ECG of 11-Feb-2021 15:21, PREVIOUS ECG IS PRESENT Confirmed by Ernie Avena ([id number]) on 02/21/2022 1:00:11 PM ? ?Radiology ?DG Chest 2 View ? ?Result Date: 02/21/2022 ?CLINICAL DATA:  Syncopal episode. EXAM: CHEST - 2 VIEW COMPARISON:  01/27/2021. FINDINGS: Cardiac silhouette is normal in size and configuration. Normal mediastinal and hilar contours. Clear lungs.  No pleural effusion or pneumothorax. Mild wedge-shaped compression deformity of a midthoracic vertebra that appears chronic. No convincing acute skeletal abnormality. IMPRESSION: No active cardiopulmonary disease. Electronically Signed   By: Amie Portland M.D.   On: 02/21/2022 13:35  ? ?CT Angio Chest/Abd/Pel for Dissection W and/or Wo Contrast ? ?Result  Date: 02/21/2022 ?CLINICAL DATA:  Presyncopal episode, fluttering and upper abdomen, history of atrial fibrillation, acute aortic syndrome suspected EXAM: CT ANGIOGRAPHY CHEST, ABDOMEN AND PELVIS TECHNIQUE: Non-contrast CT of the chest was initially obtained. Multidetector CT imaging through the chest, abdomen and pelvis was performed using the standard protocol during bolus administration of intravenous contrast. Multiplanar reconstructed images and MIPs were obtained and reviewed to evaluate the vascular anatomy. RADIATION DOSE REDUCTION: This exam was performed according to the departmental dose-optimization program which includes automated exposure control, adjustment of the mA and/or kV according to patient size and/or use of iterative reconstruction technique. CONTRAST:  OMNIPAQUE IOHEXOL 350 MG/ML SOLN COMPARISON:  None Available. FINDINGS: CTA CHEST FINDINGS VASCULAR Aorta: Satisfactory opacification of the aorta.  Normal contour and caliber of the thoracic aorta. No evidence of aneurysm, dissection, or other acute aortic pathology. Scattered aortic atherosclerosis. Cardiovascular: No evidence of pulmonary embolism on limited non-tailored examination. Normal heart size. Three-vessel coronary artery calcifications. No pericardial effusion. Review of the MIP images confirms the above findings. NON VASCULAR Mediastinum/Nodes: No enlarged mediastinal, hilar, or axillary lymph nodes. Thyroid gland, trachea, and esophagus demonstrate no significant findings. Lungs/Pleura: Lungs are clear. No pleural effusion or pneumothorax. Musculoskeletal: No chest wall abnormality. No acute osseous findings. Review of the MIP images confirms the above findings. CTA ABDOMEN AND PELVIS FINDINGS VASCULAR Normal contour and caliber of the abdominal aorta. No evidence of aneurysm, dissection, or other acute aortic pathology. Standard branching pattern of the abdominal aorta with solitary bilateral renal arteries. Mild aortic  atherosclerosis. Review of the MIP images confirms the above findings. NON-VASCULAR Hepatobiliary: No solid liver abnormality is seen. No gallstones, gallbladder wall thickening, or biliary dilatation. Pancreas: Leane Para

## 2022-02-21 NOTE — ED Notes (Signed)
Patient ambulated to checkout window without assistance or difficulty. No acute distress noted upon this RN's departure of Patient.  ?

## 2022-02-21 NOTE — Discharge Instructions (Addendum)
You were evaluated in the Emergency Department and after careful evaluation, we did not find any emergent condition requiring admission or further testing in the hospital. ? ?Your exam/testing today was overall reassuring.  Your CT imaging did not reveal any evidence of aneurysm in your thoracic aorta.  You do have severe aortic regurgitation as of your echocardiogram 3 days ago.  Your symptoms improved without intervention.  Your vitals were stable.  Your laboratory work-up and EKG was reassuring.  I did discuss your case with on-call cardiology.  You are cleared for discharge at this time with plan for outpatient follow-up with CT surgery and your cardiologist. ? ?Please return to the Emergency Department if you experience any worsening of your condition.  Thank you for allowing [place] to be a part of your care. ? ?

## 2022-02-21 NOTE — ED Notes (Signed)
Patient transported to X-ray 

## 2022-02-23 ENCOUNTER — Telehealth: Payer: Self-pay | Admitting: General Practice

## 2022-02-23 NOTE — Telephone Encounter (Signed)
Transition Care Management Follow-up Telephone Call ?Date of discharge and from where: 02/21/22 from [facility] ?How have you been since you were released from the hospital? He was feeling like he was getting dizzy. But he doing better.  ?Any questions or concerns? No ? ?Items Reviewed: ?Did the pt receive and understand the discharge instructions provided? Yes  ?Medications obtained and verified? No  ?Other? No  ?Any new allergies since your discharge? No  ?Dietary orders reviewed? Yes ?Do you have support at home? Yes  ? ?Home Care and Equipment/Supplies: ?Were home health services ordered? no ? ?Functional Questionnaire: (I = Independent and D = Dependent) ?ADLs: I ? ?Bathing/Dressing- I ? ?Meal Prep- I ? ?Eating- I ? ?Maintaining continence- I ? ?Transferring/Ambulation- I ? ?Managing Meds- I ? ?Follow up appointments reviewed: ? ?PCP Hospital f/u appt confirmed? No   ?Specialist Hospital f/u appt confirmed? Yes  Scheduled to see cardiologist for labs on 02/27/22 and will follow up further from there. ?Are transportation arrangements needed? No  ?If their condition worsens, is the pt aware to call PCP or go to the Emergency Dept.? Yes ?Was the patient provided with contact information for the PCP's office or ED? Yes ?Was to pt encouraged to call back with questions or concerns? Yes  ?

## 2022-02-25 ENCOUNTER — Encounter (HOSPITAL_COMMUNITY): Payer: Self-pay

## 2022-02-25 NOTE — Telephone Encounter (Signed)
RE: Following up on CT ANGIO CHEST CT GATED ON THIS PT ?Received: Today ?Lorenza Evangelist, RN  Nuala Alpha, LPN; Craig Guess L ?Patient scheduled for GATED CTA CHEST AORTA on 03/12/22 at 9:00am  ?Clarise Cruz  ? ? ? ?Will route this message to Dr. Vivi Martens office, to make all parties involved aware that pts CT ANGIO OF CHEST GATED is scheduled for the pt to have done on 03/12/22. ? ?

## 2022-02-27 ENCOUNTER — Other Ambulatory Visit: Payer: Commercial Managed Care - PPO

## 2022-03-06 ENCOUNTER — Other Ambulatory Visit: Payer: Commercial Managed Care - PPO | Admitting: *Deleted

## 2022-03-06 DIAGNOSIS — I10 Essential (primary) hypertension: Secondary | ICD-10-CM

## 2022-03-06 DIAGNOSIS — I351 Nonrheumatic aortic (valve) insufficiency: Secondary | ICD-10-CM

## 2022-03-06 DIAGNOSIS — Z79899 Other long term (current) drug therapy: Secondary | ICD-10-CM

## 2022-03-06 LAB — BASIC METABOLIC PANEL
BUN/Creatinine Ratio: 20 (ref 9–20)
BUN: 27 mg/dL — ABNORMAL HIGH (ref 6–24)
CO2: 24 mmol/L (ref 20–29)
Calcium: 9.5 mg/dL (ref 8.7–10.2)
Chloride: 101 mmol/L (ref 96–106)
Creatinine, Ser: 1.34 mg/dL — ABNORMAL HIGH (ref 0.76–1.27)
Glucose: 96 mg/dL (ref 70–99)
Potassium: 4.4 mmol/L (ref 3.5–5.2)
Sodium: 139 mmol/L (ref 134–144)
eGFR: 62 mL/min/{1.73_m2} (ref >59)

## 2022-03-06 LAB — LIPID PANEL
Chol/HDL Ratio: 6.7 ratio — ABNORMAL HIGH (ref 0.0–5.0)
Cholesterol, Total: 209 mg/dL — ABNORMAL HIGH (ref 100–199)
HDL: 31 mg/dL — ABNORMAL LOW (ref >39)
LDL Chol Calc (NIH): 131 mg/dL — ABNORMAL HIGH (ref 0–99)
Triglycerides: 263 mg/dL — ABNORMAL HIGH (ref 0–149)
VLDL Cholesterol Cal: 47 mg/dL — ABNORMAL HIGH (ref 5–40)

## 2022-03-10 ENCOUNTER — Telehealth: Payer: Self-pay | Admitting: *Deleted

## 2022-03-10 DIAGNOSIS — I351 Nonrheumatic aortic (valve) insufficiency: Secondary | ICD-10-CM

## 2022-03-10 DIAGNOSIS — E785 Hyperlipidemia, unspecified: Secondary | ICD-10-CM

## 2022-03-10 DIAGNOSIS — Z79899 Other long term (current) drug therapy: Secondary | ICD-10-CM

## 2022-03-10 DIAGNOSIS — I712 Thoracic aortic aneurysm, without rupture, unspecified: Secondary | ICD-10-CM

## 2022-03-10 MED ORDER — ROSUVASTATIN CALCIUM 10 MG PO TABS: 10.0000 mg | ORAL_TABLET | Freq: Every day | ORAL | 3 refills | Status: DC

## 2022-03-10 NOTE — Telephone Encounter (Signed)
-----   Message from Meriam Sprague, MD sent at 03/08/2022  8:04 PM EDT ----- Cholesterol is elevated with LDL 131 and TG 263. Kidney function and elcetrolytes are stable. Given his dilated aorta, recommend starting crestor 10mg  daily if he is amenable. Repeat lipids in 6-8 weeks

## 2022-03-10 NOTE — Telephone Encounter (Signed)
The patient has been notified of the result and verbalized understanding.  All questions (if any) were answered.  Pt aware to start taking crestor 10 mg po daily and come in for repeat lipids in 6-8 weeks.  Confirmed the pharmacy of choice with the pt.  Pt will come in for repeat lipids in 6-8 weeks on 04/22/22.  He is aware to come fasting to this lab appt.  Pt verbalized understanding and agrees with this plan.

## 2022-03-12 ENCOUNTER — Ambulatory Visit (HOSPITAL_COMMUNITY)
Admission: RE | Admit: 2022-03-12 | Discharge: 2022-03-12 | Disposition: A | Discharge status: Home / Self Care | Payer: Commercial Managed Care - PPO | Source: Ambulatory Visit | Attending: Cardiology | Admitting: Cardiology

## 2022-03-12 ENCOUNTER — Encounter: Payer: Self-pay | Admitting: Cardiology

## 2022-03-12 DIAGNOSIS — I77810 Thoracic aortic ectasia: Secondary | ICD-10-CM | POA: Insufficient documentation

## 2022-03-12 DIAGNOSIS — I712 Thoracic aortic aneurysm, without rupture, unspecified: Secondary | ICD-10-CM | POA: Diagnosis present

## 2022-03-12 MED ORDER — IOHEXOL 350 MG/ML SOLN
100.0000 mL | Freq: Once | INTRAVENOUS | Status: AC | PRN
  Administered 2022-03-12: 100 mL via INTRAVENOUS

## 2022-03-13 ENCOUNTER — Telehealth: Payer: Self-pay | Admitting: *Deleted

## 2022-03-13 DIAGNOSIS — I712 Thoracic aortic aneurysm, without rupture, unspecified: Secondary | ICD-10-CM

## 2022-03-13 NOTE — Telephone Encounter (Signed)
-----   Message from Sueanne Margarita, MD sent at 03/12/2022  7:33 PM EDT ----- CHest CT showed stable aortic root and ascending thoracic aortic dilatation at 4.2cm and aortic atherosclerosis and mild coronary artery calcifications.  Repeat Chest CTA in 1 year

## 2022-03-13 NOTE — Telephone Encounter (Signed)
The patient has been notified of the result and verbalized understanding.  All questions (if any) were answered.  Pt aware that we will go ahead and order for him to get a repeat Chest CTA to be done in one year for surveillance.  Pt aware our [facility] scheduling team will call him back to get this appt arranged for that time.   Will also make Dr. Sharee Pimple office aware of completion of this, for they will be seeing the pt for consult on 7/12. Pt verbalized understanding and agrees with this plan.  CT Angio order placed and future BMET placed as well.

## 2022-03-17 ENCOUNTER — Encounter: Payer: Self-pay | Admitting: Cardiology

## 2022-03-26 ENCOUNTER — Other Ambulatory Visit: Payer: Self-pay

## 2022-03-26 MED ORDER — DOFETILIDE 500 MCG PO CAPS: ORAL_CAPSULE | ORAL | 3 refills | Status: DC

## 2022-03-31 ENCOUNTER — Other Ambulatory Visit: Payer: Self-pay

## 2022-03-31 DIAGNOSIS — I5042 Chronic combined systolic (congestive) and diastolic (congestive) heart failure: Secondary | ICD-10-CM

## 2022-03-31 MED ORDER — TORSEMIDE 20 MG PO TABS: ORAL_TABLET | ORAL | 11 refills | Status: DC

## 2022-04-17 ENCOUNTER — Encounter: Payer: Self-pay | Admitting: Cardiology

## 2022-04-21 ENCOUNTER — Other Ambulatory Visit: Payer: Self-pay | Admitting: *Deleted

## 2022-04-21 ENCOUNTER — Other Ambulatory Visit (HOSPITAL_BASED_OUTPATIENT_CLINIC_OR_DEPARTMENT_OTHER): Payer: Self-pay | Admitting: Family

## 2022-04-21 DIAGNOSIS — I5042 Chronic combined systolic (congestive) and diastolic (congestive) heart failure: Secondary | ICD-10-CM

## 2022-04-21 MED ORDER — ENTRESTO 97-103 MG PO TABS: 1.0000 | ORAL_TABLET | Freq: Two times a day (BID) | ORAL | 3 refills | Status: DC

## 2022-04-21 MED ORDER — METOPROLOL SUCCINATE ER 100 MG PO TB24: 100.0000 mg | ORAL_TABLET | Freq: Every day | ORAL | 3 refills | Status: DC

## 2022-04-22 ENCOUNTER — Other Ambulatory Visit: Payer: Commercial Managed Care - PPO

## 2022-04-22 ENCOUNTER — Institutional Professional Consult (permissible substitution) (INDEPENDENT_AMBULATORY_CARE_PROVIDER_SITE_OTHER): Payer: Commercial Managed Care - PPO | Admitting: Surgery

## 2022-04-22 VITALS — BP 140/78 | HR 83 | Resp 20 | Ht 69.0 in | Wt 295.0 lb

## 2022-04-22 DIAGNOSIS — E785 Hyperlipidemia, unspecified: Secondary | ICD-10-CM

## 2022-04-22 DIAGNOSIS — Z79899 Other long term (current) drug therapy: Secondary | ICD-10-CM

## 2022-04-22 DIAGNOSIS — I350 Nonrheumatic aortic (valve) stenosis: Secondary | ICD-10-CM

## 2022-04-22 DIAGNOSIS — I7121 Aneurysm of the ascending aorta, without rupture: Secondary | ICD-10-CM | POA: Diagnosis not present

## 2022-04-22 DIAGNOSIS — I712 Thoracic aortic aneurysm, without rupture, unspecified: Secondary | ICD-10-CM

## 2022-04-22 DIAGNOSIS — I351 Nonrheumatic aortic (valve) insufficiency: Secondary | ICD-10-CM

## 2022-04-22 LAB — LIPID PANEL
Chol/HDL Ratio: 4.8 ratio (ref 0.0–5.0)
Cholesterol, Total: 150 mg/dL (ref 100–199)
HDL: 31 mg/dL — ABNORMAL LOW (ref >39)
LDL Chol Calc (NIH): 92 mg/dL (ref 0–99)
Triglycerides: 155 mg/dL — ABNORMAL HIGH (ref 0–149)
VLDL Cholesterol Cal: 27 mg/dL (ref 5–40)

## 2022-04-22 NOTE — Progress Notes (Signed)
Cardiothoracic Surgery Consultation  PCP is Agapito Games, MD Referring Provider is Meriam Sprague, MD  Chief Complaint  Patient presents with   Aortic Stenosis   Thoracic Aortic Aneurysm    Surgical consult    HPI:  The patient is a 56 year old gentleman with a history of hypertension, morbid obesity, OSA,  atrial fibrillation status post failed DCCV in April 2022 converted on Tikosyn, combined systolic and diastolic congestive heart failure, and severe aortic insufficiency who has been followed by Dr. Shari Prows.  A 2D echocardiogram on 04/21/2021 showed a left ventricular ejection fraction of 45 to 50%.  There was felt to be at least moderate aortic insufficiency.  A cardiac MRI was done on 06/20/2021 and showed severe aortic insufficiency with a regurgitant fraction of 51%.  There is dilation of the aortic root to 4.6 cm with an ascending aortic diameter of 4.1 cm.  Left ventricular ejection fraction was measured at 54% with an RV EF of 58%.  There was mild mitral regurgitation and moderate tricuspid regurgitation.  He reports feeling well overall without any symptoms.  He has been working with a Psychologist, educational and exercising regularly for the past 6 months trying to lose some weight.  He denies any exertional fatigue or shortness of breath.  He occasionally has some lower extremity edema for which she takes torsemide as needed.  He was seen in the emergency room on 02/21/2022 after he developed some palpitations while driving and felt some lightheadedness.  He had a CTA of the chest, abdomen, and pelvis to rule out aortic dissection which was negative.   Past Medical History:  Diagnosis Date   Alcoholic (HCC)    recovering since 10/1999   Ascending aorta dilatation (HCC)    35mm in ascending aorta by CTA 03/2022   Combined systolic and diastolic congestive heart failure (HCC)    a. Echo 4/22 LVEF 35-40%, unable assess RWMA and diastolic function, RV normal, LA severely dilated,   mod RAE, mild dilatation of the aortic root, measuring 46 mm, mild dilatation of the ascending aorta, 41 mm; b. Echo 7/22 improved EF of 45 to 50% up from 35 to 40%, apical hypokinesis, severe LAE, moderate RAE, moderate AV regurgitation, mod dilatation of the ascendig aorta, 42 mm.   Former tobacco use    Hypertension    Morbid obesity (HCC)    OSA (obstructive sleep apnea)    Persistent atrial fibrillation (HCC)    S/p failed DCCV 4/22, on Tikosyn c/ restoration of NSR, Eliquis.   Severe aortic regurgitation    a. Echo 7/22 c/ mod AR; b. cMRI 9/22 c/ severe AR.   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    Past Surgical History:  Procedure Laterality Date   CARDIOVERSION N/A 01/29/2021   Procedure: CARDIOVERSION;  Surgeon: Wendall Stade, MD;  Location: [facility] ENDOSCOPY;  Service: Cardiovascular;  Laterality: N/A;   MOUTH SURGERY     Dental work   TEE WITHOUT CARDIOVERSION N/A 01/29/2021   Procedure: TRANSESOPHAGEAL ECHOCARDIOGRAM (TEE);  Surgeon: Wendall Stade, MD;  Location: [facility] ENDOSCOPY;  Service: Cardiovascular;  Laterality: N/A;   TONSILLECTOMY  1972    Family History  Problem Relation Age of Onset   Diabetes Mother    Hypertension Mother    Heart attack Father 38   Alcohol abuse Other     Social History Social History   Tobacco Use   Smoking status: Former    Packs/day: 2.00  Years: 16.00    Total pack years: 32.00    Types: Cigarettes    Quit date: 04/27/1999    Years since quitting: 23.0   Smokeless tobacco: Never  Vaping Use   Vaping Use: Never used  Substance Use Topics   Alcohol use: Not Currently    Comment: Former Advice worker in 01/3153. former heavy drinker 20beers/night.   Drug use: Not Currently    Types: Marijuana, MDMA (Ecstacy), Oxycodone    Comment: stopped in 2001    Current Outpatient Medications  Medication Sig Dispense Refill   AMBULATORY NON FORMULARY MEDICATION Medication Name: CPAP with tubing and mask and  humidifier set to AutoPap between 4 and 16 cm of water pressure.  Please fax download after 10 days, to [phone] 1 Units 0   apixaban (ELIQUIS) 5 MG TABS tablet Take 1 tablet (5 mg total) by mouth 2 (two) times daily. TAKE 1 TABLET(5 MG) BY MOUTH TWICE DAILY 180 tablet 1   dapagliflozin propanediol (FARXIGA) 10 MG TABS tablet Take 1 tablet (10 mg total) by mouth daily before breakfast. 90 tablet 3   dofetilide (TIKOSYN) 500 MCG capsule TAKE 1 CAPSULE(500 MCG) BY MOUTH TWICE DAILY 180 capsule 3   metoprolol succinate (TOPROL-XL) 100 MG 24 hr tablet Take 1 tablet (100 mg total) by mouth daily. Take with or immediately following a meal. 90 tablet 3   Multiple Vitamin (MULTIVITAMIN WITH MINERALS) TABS tablet Take 1 tablet by mouth daily.     Multiple Vitamins-Minerals (AIRBORNE PO) Take 1 packet by mouth daily.     rosuvastatin (CRESTOR) 10 MG tablet Take 1 tablet (10 mg total) by mouth daily. 90 tablet 3   sacubitril-valsartan (ENTRESTO) 97-103 MG Take 1 tablet by mouth 2 (two) times daily. 180 tablet 3   spironolactone (ALDACTONE) 25 MG tablet Take 1 tablet (25 mg total) by mouth daily. 90 tablet 2   torsemide (DEMADEX) 20 MG tablet TAKE 1 TO 2 TABLETS(20 TO 40 MG) BY MOUTH DAILY AS NEEDED 60 tablet 11   No current facility-administered medications for this visit.    No Known Allergies  Review of Systems  Constitutional:  Negative for activity change, fatigue and unexpected weight change.  HENT: Negative.         Dentures  Eyes: Negative.   Respiratory:  Negative for chest tightness and shortness of breath.   Cardiovascular:  Positive for palpitations and leg swelling.       Occasionally  Gastrointestinal: Negative.   Endocrine: Negative.   Genitourinary: Negative.   Musculoskeletal: Negative.   Skin: Negative.   Allergic/Immunologic: Negative.   Neurological:  Positive for dizziness. Negative for syncope.  Hematological: Negative.   Psychiatric/Behavioral:  The patient is  nervous/anxious.     BP 140/78   Pulse 83   Resp 20   Ht 5\' 9"  (1.753 m)   Wt 295 lb (133.8 kg)   SpO2 97% Comment: RA  BMI 43.56 kg/m  Physical Exam Constitutional:      Appearance: Normal appearance. He is obese.  HENT:     Head: Normocephalic and atraumatic.  Eyes:     Extraocular Movements: Extraocular movements intact.     Conjunctiva/sclera: Conjunctivae normal.     Pupils: Pupils are equal, round, and reactive to light.  Neck:     Vascular: No carotid bruit.  Cardiovascular:     Rate and Rhythm: Normal rate and regular rhythm.     Pulses: Normal pulses.     Heart sounds: Murmur heard.  Comments: 2/6 diastolic murmur RSB Pulmonary:     Effort: Pulmonary effort is normal.     Breath sounds: Normal breath sounds.  Abdominal:     General: There is no distension.     Tenderness: There is no abdominal tenderness.  Musculoskeletal:        General: No swelling. Normal range of motion.     Cervical back: Normal range of motion and neck supple.  Skin:    General: Skin is warm and dry.  Neurological:     General: No focal deficit present.     Mental Status: He is alert and oriented to person, place, and time.  Psychiatric:        Mood and Affect: Mood normal.        Behavior: Behavior normal.      Diagnostic Tests:  ECHOCARDIOGRAM REPORT         Patient Name:   JUN OSMENT Date of Exam: 02/18/2022  Medical Rec #:  [id number]        Height:       69.0 in  Accession #:    [id number]       Weight:       308.8 lb  Date of Birth:  1966/01/16        BSA:          2.485 m  Patient Age:    55 years         BP:           140/60 mmHg  Patient Gender: M                HR:           77 bpm.  Exam Location:  Outpatient   Procedure: 2D Echo, Cardiac Doppler, Color Doppler and Intracardiac             Opacification Agent   Indications:    I50.42 Chronic combined systolic (congestive) and  diastolic                  (congestive) heart failure; R06.9 DOE; R60.0  Lower  extremity                  edema     History:        Patient has prior history of Echocardiogram examinations,  most                  recent 04/21/2021. CHF, Arrythmias:Atrial Fibrillation,                  Signs/Symptoms:Edema and Dyspnea; Risk Factors:Former  Smoker,                  Hypertension and Sleep Apnea. Patient denies chest pain.  He does                  have DOE with bilateral leg edema.     Sonographer:    Carlos American RVT, RDCS (AE), RDMS  Referring Phys: [phone] [facility]      Sonographer Comments: Patient is morbidly obese, suboptimal parasternal  window and suboptimal apical window. Image acquisition challenging due to  patient body habitus.  IMPRESSIONS     1. Left ventricular ejection fraction, by estimation, is 50 to 55%. The  left ventricle has low normal function. The left ventricle has no regional  wall motion abnormalities. There is severe left ventricular hypertrophy.  Left ventricular diastolic  parameters are consistent  with Grade I diastolic dysfunction (impaired  relaxation).   2. Right ventricular systolic function is normal. The right ventricular  size is normal.   3. Left atrial size was severely dilated.   4. The mitral valve is normal in structure. No evidence of mitral valve  regurgitation. No evidence of mitral stenosis.   5. The aortic valve is normal in structure. Aortic valve regurgitation is  severe. No aortic stenosis is present.   6. Aortic dilatation noted. There is moderate dilatation of the aortic  root, measuring 47 mm. There is borderline dilatation of the ascending  aorta, measuring 38 mm.   7. The inferior vena cava is normal in size with greater than 50%  respiratory variability, suggesting right atrial pressure of 3 mmHg.   Comparison(s): EF 45%, apical hypokinesis, mild LVH, mod AI, AOR 16mm.   FINDINGS   Left Ventricle: Left ventricular ejection fraction, by estimation, is 50  to 55%. The left ventricle  has low normal function. The left ventricle has  no regional wall motion abnormalities. Definity contrast agent was given  IV to delineate the left  ventricular endocardial borders. The left ventricular internal cavity size  was normal in size. There is severe left ventricular hypertrophy. Left  ventricular diastolic parameters are consistent with Grade I diastolic  dysfunction (impaired relaxation).   Right Ventricle: The right ventricular size is normal. No increase in  right ventricular wall thickness. Right ventricular systolic function is  normal.   Left Atrium: Left atrial size was severely dilated.   Right Atrium: Right atrial size was normal in size.   Pericardium: There is no evidence of pericardial effusion.   Mitral Valve: The mitral valve is normal in structure. No evidence of  mitral valve regurgitation. No evidence of mitral valve stenosis.   Tricuspid Valve: The tricuspid valve is normal in structure. Tricuspid  valve regurgitation is not demonstrated. No evidence of tricuspid  stenosis.   Aortic Valve: The aortic valve is normal in structure. Aortic valve  regurgitation is severe. Aortic regurgitation PHT measures 342 msec. No  aortic stenosis is present. Aortic valve mean gradient measures 4.0 mmHg.  Aortic valve peak gradient measures 10.6   mmHg. Aortic valve area, by VTI measures 4.68 cm.   Pulmonic Valve: The pulmonic valve was normal in structure. Pulmonic valve  regurgitation is trivial. No evidence of pulmonic stenosis.   Aorta: Aortic dilatation noted. There is moderate dilatation of the aortic  root, measuring 47 mm. There is borderline dilatation of the ascending  aorta, measuring 38 mm.   Venous: The inferior vena cava is normal in size with greater than 50%  respiratory variability, suggesting right atrial pressure of 3 mmHg.   IAS/Shunts: No atrial level shunt detected by color flow Doppler.      LEFT VENTRICLE  PLAX 2D  LVIDd:         4.41  cm      Diastology  LVIDs:         3.02 cm      LV e' medial:    7.83 cm/s  LV PW:         1.71 cm      LV E/e' medial:  7.9  LV IVS:        1.76 cm      LV e' lateral:   8.59 cm/s  LVOT diam:     2.90 cm      LV E/e' lateral: 7.2  LV SV:  157  LV SV Index:   63  LVOT Area:     6.61 cm     LV Volumes (MOD)  LV vol d, MOD A2C: 152.0 ml  LV vol d, MOD A4C: 180.0 ml  LV vol s, MOD A2C: 69.1 ml  LV vol s, MOD A4C: 88.2 ml  LV SV MOD A2C:     82.9 ml  LV SV MOD A4C:     180.0 ml  LV SV MOD BP:      82.0 ml   RIGHT VENTRICLE  RV S prime:     16.30 cm/s  TAPSE (M-mode): 2.8 cm   LEFT ATRIUM              Index        RIGHT ATRIUM           Index  LA diam:        5.10 cm  2.05 cm/m   RA Area:     21.20 cm  LA Vol (A2C):   166.0 ml 66.80 ml/m  RA Volume:   63.00 ml  25.35 ml/m  LA Vol (A4C):   158.0 ml 63.58 ml/m  LA Biplane Vol: 168.0 ml 67.61 ml/m   AORTIC VALVE                    PULMONIC VALVE  AV Area (Vmax):    4.30 cm     PV Vmax:          1.04 m/s  AV Area (Vmean):   4.51 cm     PV Peak grad:     4.3 mmHg  AV Area (VTI):     4.68 cm     PR End Diast Vel: 7.84 msec  AV Vmax:           163.00 cm/s  AV Vmean:          96.600 cm/s  AV VTI:            0.336 m  AV Peak Grad:      10.6 mmHg  AV Mean Grad:      4.0 mmHg  LVOT Vmax:         106.00 cm/s  LVOT Vmean:        65.900 cm/s  LVOT VTI:          0.238 m  LVOT/AV VTI ratio: 0.71  AI PHT:            342 msec  AR Vena Contracta: 0.32 cm     AORTA  Ao Root diam: 4.70 cm  Ao Asc diam:  3.80 cm  Ao Arch diam: 3.6 cm   MITRAL VALVE  MV Area (PHT): 3.53 cm     SHUNTS  MV Decel Time: 215 msec     Systemic VTI:  0.24 m  MV E velocity: 61.80 cm/s   Systemic Diam: 2.90 cm  MV A velocity: 101.00 cm/s  MV E/A ratio:  0.61   Donato Schultz MD  Electronically signed by Donato Schultz MD  Signature Date/Time: 02/18/2022/12:20:17 PM         Final     Narrative & Impression  CLINICAL DATA:  Evaluate AI, aortic  dilatation   EXAM: CARDIAC MRI   TECHNIQUE: The patient was scanned on a 1.5 Tesla Siemens magnet. A dedicated cardiac coil was used. Functional imaging was done using Fiesta sequences. 2,3, and 4 chamber views were done to assess for RWMA's. Modified Simpson's rule  using a short axis stack was used to calculate an ejection fraction on a dedicated work Research officer, trade unionstation using Circle software. The patient received 10 cc of Gadavist. After 10 minutes inversion recovery sequences were used to assess for infiltration and scar tissue.   CONTRAST:  10 cc  of Gadavist   FINDINGS: Left ventricle:   -Mild hypertrophy   -Mild dilatation   -Normal systolic function   -Normal ECV (28%)   -No LGE   LV EF: 54% (Normal 56-78%)   Absolute volumes:   LV EDV: 272mL (Normal 77-195 mL)   LV ESV: 125mL (Normal 19-72 mL)   LV SV: 147mL (Normal 51-133 mL)   CO: 9.1L/min (Normal 2.8-8.8 L/min)   Indexed volumes:   LV EDV: 16505mL/sq-m (Normal 47-92 mL/sq-m)   LV ESV: 6348mL/sq-m (Normal 13-30 mL/sq-m)   LV SV: 6357mL/sq-m (Normal 32-62 mL/sq-m)   CI: 3.6L/min/sq-m (Normal 1.7-4.2 L/min/sq-m)   Right ventricle: Normal size and systolic function   RV EF:  58% (Normal 47-74%)   Absolute volumes:   RV EDV: 148mL (Normal 88-227 mL)   RV ESV: 62mL (Normal 23-103 mL)   RV SV: 86mL (Normal 52-138 mL)   CO: 5.3L/min (Normal 2.8-8.8 L/min)   Indexed volumes:   RV EDV: 2957mL/sq-m (Normal 55-105 mL/sq-m)   RV ESV: 7724mL/sq-m (Normal 15-43 mL/sq-m)   RV SV: 8133mL/sq-m (Normal 32-64 mL/sq-m)   CI: 2.1L/min/sq-m (Normal 1.7-4.2 L/min/sq-m)   Left atrium: Moderate enlargement   Right atrium: Normal size   Mitral valve: Mild regurgitation (regurgitant fraction 17%)   Aortic valve: Severe regurgitation (regurgitant fraction 51%)   Tricuspid valve: Moderate regurgitation (regurgitant fraction 22%)   Pulmonic valve: No regurgitation   Aorta: Aortic root measures 46mm, ascending aorta  measures 41mm, aortic arch measures 35mm, descending aorta measures 26mm   Pericardium: Normal   IMPRESSION: 1.  Severe aortic valve regurgitation (regurgitant fraction 51%)   2. 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 aorta measuring 41mm   3. Mild LV dilatation, mild hypertrophy, and normal systolic function (EF 54%)   4.  Normal RV size and systolic function (EF 58%)   5.  No late gadolinium enhancement to suggest myocardial scar   6.  Moderate tricuspid regurgitation (regurgitant fraction 22%)   7.  Mild mitral regurgitation (regurgitant fraction 17%)     Electronically Signed   By: Epifanio Lescheshristopher  Schumann M.D.   On: 06/22/2021 14:16     Narrative & Impression  CLINICAL DATA:  56 year old male, evaluate for thoracic aortic aneurysm.   EXAM: CT ANGIOGRAPHY CHEST WITH CONTRAST   TECHNIQUE: Multidetector CT imaging of the chest was performed using the standard protocol during bolus administration of intravenous contrast. Multiplanar CT image reconstructions and MIPs were obtained to evaluate the vascular anatomy.   RADIATION DOSE REDUCTION: This exam was performed according to the departmental dose-optimization program which includes automated exposure control, adjustment of the mA and/or kV according to patient size and/or use of iterative reconstruction technique.   CONTRAST:  100 mL Omnipaque 350, intravenous   COMPARISON:  02/21/2022, 06/20/2021   FINDINGS: Cardiovascular: Preferential opacification of the thoracic aorta. No evidence of thoracic aortic aneurysm or dissection. Minimal scattered atherosclerotic calcifications. Normal heart size. No pericardial effusion.   Sinues of Valsalva: 32 mm 35 x 35 mm ,unchanged   Sinotubular Junction: 42 mm ,unchanged   Ascending Aorta: 41 mm ,unchanged   Aortic Arch: 35 mm ,unchanged   Descending aorta: 28 mm at the level of the carina ,unchanged  Branch vessels: Conventional  branching pattern. No significant atherosclerotic changes.   Coronary arteries: Normal origins and courses. Mild atherosclerotic calcifications.   Main pulmonary artery: 27 mm ,unchanged. No evidence of central pulmonary embolism.   Pulmonary veins: No anomalous pulmonary venous return. No evidence of left atrial appendage thrombus.   Upper abdominal vasculature: Within normal limits.   Mediastinum/Nodes: No enlarged mediastinal, hilar, or axillary lymph nodes. Thyroid gland, trachea, and esophagus demonstrate no significant findings.   Lungs/Pleura: No focal consolidations. No suspicious pulmonary nodules. No pleural effusion or pneumothorax.   Upper Abdomen: Partially visualized right simple renal cyst. The remaining visualized upper abdomen is within normal limits.   Musculoskeletal: Multilevel degenerative changes of the visualized thoracic spine. No acute osseous abnormality.   Review of the MIP images confirms the above findings.   IMPRESSION: Vascular:   1. 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 Recommend annual imaging followup by CTA or MRA. This recommendation follows 2010 ACCF/AHA/AATS/ACR/ASA/SCA/SCAI/SIR/STS/SVM Guidelines for the Diagnosis and Management of Patients with Thoracic Aortic Disease. Circulation. 2010; 121: Y403-K742. Aortic aneurysm NOS (ICD10-I71.9) 2. Coronary aortic atherosclerosis (ICD10-I70.0).   Non-Vascular:   No acute or significant intrathoracic abnormality.   Marliss Coots, MD   [facility]   [facility]     Electronically Signed   By: Marliss Coots M.D.   On: 03/12/2022 16:05      Impression:  This 56 year old gentleman has stage C, asymptomatic, severe aortic insufficiency with a 4.6 cm aortic root on cardiac MRI with a 4.1 cm ascending aorta.  He has a normal left ventricular ejection  fraction and normal left ventricular internal dimensions.  He feels well and is continuing to exercise without restriction.  I do not think there is any absolute indication for surgical treatment of his aortic insufficiency or aortic aneurysm at this time but his left ventricular function and dimensions should be followed closely with echocardiograms every 6 months.  I think his aortic root dilation can also be followed with echocardiogram.  I reviewed the echo, CT, and cardiac MRI images with him and answered all of his questions.  I reviewed the symptoms of aortic insufficiency with him and told him that if he develops any of these he needs to let cardiology and myself know immediately so that we can reevaluate him since onset of symptoms would be another indication for surgical treatment.  We did discuss surgical treatment including aortic valve and ascending aortic replacement as well as valve sparing root replacement depending on the appearance of his valve.  Plan:  He will continue to follow-up with Dr. Shari Prows for echocardiogram every 6 months and will return to see me to discuss surgery further if there are any significant changes in his left ventricular systolic function, internal dimensions, or development of symptoms.  I spent 60 minutes performing this consultation and > 50% of this time was spent face to face counseling and coordinating the care of this patient's severe aortic insufficiency and aortic aneurysm.   Alleen Borne, MD [facility] [phone]

## 2022-04-23 ENCOUNTER — Telehealth: Payer: Self-pay | Admitting: *Deleted

## 2022-04-23 DIAGNOSIS — Z79899 Other long term (current) drug therapy: Secondary | ICD-10-CM

## 2022-04-23 DIAGNOSIS — E785 Hyperlipidemia, unspecified: Secondary | ICD-10-CM

## 2022-04-23 MED ORDER — ROSUVASTATIN CALCIUM 20 MG PO TABS: 20.0000 mg | ORAL_TABLET | Freq: Every day | ORAL | 2 refills | Status: DC

## 2022-04-23 NOTE — Telephone Encounter (Signed)
-----   Message from Meriam Sprague, MD sent at 04/22/2022  8:59 PM EDT ----- His LDL is above goal at 90. Can we increase crestor to 20mg  daily for a goal LDL<70 and repeat lipids in 6-8 weeks?

## 2022-04-23 NOTE — Telephone Encounter (Signed)
The patient has been notified of the result and verbalized understanding.  All questions (if any) were answered.  Pt aware to increase his crestor to 20 mg po daily and come in for repeat lipids in 6-8 weeks.  Confirmed the pharmacy of choice with the pt.  Pt will come in for repeat lipids in 6 weeks on 06/03/22.  He is aware to come fasting to this lab appt.  Pt verbalized understanding and agrees with this plan.

## 2022-05-19 NOTE — Progress Notes (Deleted)
Cardiology Office Note:    Date:  05/19/2022   ID:  Joe Morrison, DOB 1965/11/09, MRN [id number]  PCP:  Agapito Games, MD   [facility] Providers Cardiologist:  Meriam Sprague, MD Electrophysiologist:  Lewayne Bunting, MD  {    Referring MD: Agapito Games, *    History of Present Illness:    Joe Morrison is a 56 y.o. male with a hx atrial fibrillation with failed DCCV now on tikosyn and  combined systolic and diastolic heart failure, hypertension, severe aortic regurgitation, and morbid obesity who presents to clinic for follow-up.  Patient was admitted from 01/27/21-02/01/21 where he presented with worsening shortness of breath and volume overload found to have new onest atrial fibrillation with RVR as well was new acute systolic and diastolic heart failure with LVEF 35-40%. BNP on admission 475. He was diuresed with improvement and underwent DCCV, however, failed to return to NSR. EP was consulted and he subsequently underwent tikosyn load with successful return to NSR.    Seen in clinic on 02/25/21 where he was clinically improving. No HF or anginal symptoms. TTE 04/21/21 showed LVEF 45-50%, moderately dilated, mild LVH, severe LAE, at least moderate AR, ascending aorta 28mm. Recommended for CMR which is now pending.  He had cardiac MRI 06/21/2021 showing LVEF had returned to normal with LVEF 54%, severe AI, dilation of aortic root 46 mm, dilation of ascending aorta 41 mm, moderate TR, mild MR.  Was seen in clinic by Otilio Saber on 10/2021 where he was doing very well. Was exercising and working with a Psychologist, educational. Was started on CPAP for + sleep study.   Was last seen in clinic in 02/2022 where he was doing very well. Remained active and was working with a Psychologist, educational.  We referred him to CT surgery for severe AR with regurgitant fraction of 51%. Given that he was asymptomatic and active, he was recommended for continued surveillance with serial echoes in 6 months.    Today, ***  He denies any palpitations, or shortness of breath. No lightheadedness, headaches, syncope, orthopnea, or PND.  Past Medical History:  Diagnosis Date   Alcoholic (HCC)    recovering since 10/1999   Ascending aorta dilatation (HCC)    10mm in ascending aorta by CTA 03/2022   Combined systolic and diastolic congestive heart failure (HCC)    a. Echo 4/22 LVEF 35-40%, unable assess RWMA and diastolic function, RV normal, LA severely dilated,  mod RAE, mild dilatation of the aortic root, measuring 46 mm, mild dilatation of the ascending aorta, 41 mm; b. Echo 7/22 improved EF of 45 to 50% up from 35 to 40%, apical hypokinesis, severe LAE, moderate RAE, moderate AV regurgitation, mod dilatation of the ascendig aorta, 42 mm.   Former tobacco use    Hypertension    Morbid obesity (HCC)    OSA (obstructive sleep apnea)    Persistent atrial fibrillation (HCC)    S/p failed DCCV 4/22, on Tikosyn c/ restoration of NSR, Eliquis.   Severe aortic regurgitation    a. Echo 7/22 c/ mod AR; b. cMRI 9/22 c/ severe AR.   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    Past Surgical History:  Procedure Laterality Date   CARDIOVERSION N/A 01/29/2021   Procedure: CARDIOVERSION;  Surgeon: Wendall Stade, MD;  Location: [facility] ENDOSCOPY;  Service: Cardiovascular;  Laterality: N/A;   MOUTH SURGERY     Dental work   TEE WITHOUT CARDIOVERSION N/A  01/29/2021   Procedure: TRANSESOPHAGEAL ECHOCARDIOGRAM (TEE);  Surgeon: Wendall StadeNishan, Peter C, MD;  [facility];  Service: Cardiovascular;  Laterality: N/A;   TONSILLECTOMY  1972    Current Medications: No outpatient medications have been marked as taking for the 05/25/22 encounter (Appointment) with Meriam SpraguePemberton, Declan Adamson E, MD.     Allergies:   Patient has no known allergies.   Social History   Socioeconomic History   Marital status: Single    Spouse name: Not on file   Number of children: 0   Years of education: Not on file    Highest education level: Some college, no degree  Occupational History   Occupation: Engineer, drillingChief operating officer  Tobacco Use   Smoking status: Former    Packs/day: 2.00    Years: 16.00    Total pack years: 32.00    Types: Cigarettes    Quit date: 04/27/1999    Years since quitting: 23.0   Smokeless tobacco: Never  Vaping Use   Vaping Use: Never used  Substance and Sexual Activity   Alcohol use: Not Currently    Comment: Former Advice workeralcoholic-Quit in 4/54091/2001. former heavy drinker 20beers/night.   Drug use: Not Currently    Types: Marijuana, MDMA (Ecstacy), Oxycodone    Comment: stopped in 2001   Sexual activity: Not Currently    Partners: Male    Birth control/protection: None  Other Topics Concern   Not on file  Social History Narrative   Working out daily with a Psychologist, educationaltrainer. No partner   Social Determinants of Health   Financial Resource Strain: Low Risk  (01/29/2021)   Overall Financial Resource Strain (CARDIA)    Difficulty of Paying Living Expenses: Not hard at all  Food Insecurity: No Food Insecurity (01/29/2021)   Hunger Vital Sign    Worried About Running Out of Food in the Last Year: Never true    Ran Out of Food in the Last Year: Never true  Transportation Needs: No Transportation Needs (01/29/2021)   PRAPARE - Administrator, Civil ServiceTransportation    Lack of Transportation (Medical): No    Lack of Transportation (Non-Medical): No  Physical Activity: Sufficiently Active (01/29/2021)   Exercise Vital Sign    Days of Exercise per Week: 3 days    Minutes of Exercise per Session: 60 min  Stress: Stress Concern Present (01/29/2021)   [employer] - Occupational Stress Questionnaire    Feeling of Stress : Rather much  Social Connections: Not on file     Family History: The patient's family history includes Alcohol abuse in an other family member; Diabetes in his mother; Heart attack (age of onset: 4153) in his father; Hypertension in his mother.  ROS:   Please see the  history of present illness.    Review of Systems  Constitutional:  Negative for diaphoresis and malaise/fatigue.  HENT:  Negative for hearing loss, nosebleeds and sore throat.   Eyes:  Negative for blurred vision and double vision.  Respiratory:  Negative for sputum production, shortness of breath and wheezing.   Cardiovascular:  Positive for chest pain (Dull ache) and leg swelling (occasional). Negative for palpitations, orthopnea, claudication and PND.  Gastrointestinal:  Negative for heartburn, nausea and vomiting.  Genitourinary:  Negative for dysuria and hematuria.  Musculoskeletal:  Negative for falls and joint pain.  Neurological:  Negative for dizziness, seizures, loss of consciousness and headaches.  Endo/Heme/Allergies:  Does not bruise/bleed easily.  Psychiatric/Behavioral:  Negative for depression and memory loss. The patient is nervous/anxious.  EKGs/Labs/Other Studies Reviewed:    The following studies were reviewed today:  Echo 02/18/2022: Sonographer Comments: Patient is morbidly obese, suboptimal parasternal  window and suboptimal apical window. Image acquisition challenging due to  patient body habitus.  IMPRESSIONS    1. Left ventricular ejection fraction, by estimation, is 50 to 55%. The  left ventricle has low normal function. The left ventricle has no regional  wall motion abnormalities. There is severe left ventricular hypertrophy.  Left ventricular diastolic  parameters are consistent with Grade I diastolic dysfunction (impaired  relaxation).   2. Right ventricular systolic function is normal. The right ventricular  size is normal.   3. Left atrial size was severely dilated.   4. The mitral valve is normal in structure. No evidence of mitral valve  regurgitation. No evidence of mitral stenosis.   5. The aortic valve is normal in structure. Aortic valve regurgitation is  severe. No aortic stenosis is present.   6. Aortic dilatation noted. There is  moderate dilatation of the aortic  root, measuring 47 mm. There is borderline dilatation of the ascending  aorta, measuring 38 mm.   7. The inferior vena cava is normal in size with greater than 50%  respiratory variability, suggesting right atrial pressure of 3 mmHg.   Comparison(s): EF 45%, apical hypokinesis, mild LVH, mod AI, AOR 58mm.   CMR 06/20/21:   FINDINGS: Left ventricle:   -Mild hypertrophy   -Mild dilatation   -Normal systolic function   -Normal ECV (28%)   -No LGE   LV EF: 54% (Normal 56-78%)   Absolute volumes:   LV EDV: (Normal 77-195 mL)   LV ESV: (Normal 19-72 mL)   LV SV: (Normal 51-133 mL)   CO: 9.1L/min (Normal 2.8-8.8 L/min)   Indexed volumes:   LV EDV: 179mL/sq-m (Normal 47-92 mL/sq-m)   LV ESV: 50mL/sq-m (Normal 13-30 mL/sq-m)   LV SV: 25mL/sq-m (Normal 32-62 mL/sq-m)   CI: 3.6L/min/sq-m (Normal 1.7-4.2 L/min/sq-m)   Right ventricle: Normal size and systolic function   RV EF:  58% (Normal 47-74%)   Absolute volumes:   RV EDV: (Normal 88-227 mL)   RV ESV: 61mL (Normal 23-103 mL)   RV SV: 9mL (Normal 52-138 mL)   CO: 5.3L/min (Normal 2.8-8.8 L/min)   Indexed volumes:   RV EDV: 12mL/sq-m (Normal 55-105 mL/sq-m)   RV ESV: 16mL/sq-m (Normal 15-43 mL/sq-m)   RV SV: 51mL/sq-m (Normal 32-64 mL/sq-m)   CI: 2.1L/min/sq-m (Normal 1.7-4.2 L/min/sq-m)   Left atrium: Moderate enlargement   Right atrium: Normal size   Mitral valve: Mild regurgitation (regurgitant fraction 17%)   Aortic valve: Severe regurgitation (regurgitant fraction 51%)   Tricuspid valve: Moderate regurgitation (regurgitant fraction 22%)   Pulmonic valve: No regurgitation   Aorta: Aortic root measures 74mm, ascending aorta measures 34mm, aortic arch measures 75mm, descending aorta measures 9mm   Pericardium: Normal   IMPRESSION: 1.  Severe aortic valve regurgitation (regurgitant fraction 51%)   2. Dilatation of aortic root  measuring 32mm. Dilatation of ascending aorta measuring 48mm   3. Mild LV dilatation, mild hypertrophy, and normal systolic function (EF 54%)   4.  Normal RV size and systolic function (EF 58%)   5.  No late gadolinium enhancement to suggest myocardial scar   6.  Moderate tricuspid regurgitation (regurgitant fraction 22%)   7.  Mild mitral regurgitation (regurgitant fraction 17%)  TTE 04/21/21: IMPRESSIONS   1. Patient in NSR compared to recent TEE done 01/29/21.   2. Apical  hypokinesis . Left ventricular ejection fraction, by  estimation, is 45 to 50%. The left ventricle has mildly decreased  function. The left ventricle demonstrates regional wall motion  abnormalities (see scoring diagram/findings for description).   The left ventricular internal cavity size was moderately dilated. There  is mild left ventricular hypertrophy. Left ventricular diastolic  parameters were normal.   3. Right ventricular systolic function is normal. The right ventricular  size is normal.   4. Left atrial size was severely dilated.   5. The mitral valve is normal in structure. No evidence of mitral valve  regurgitation. No evidence of mitral stenosis.   6. Degree of AR poorly characterized. Appears to be holosystolic flow  reversal in supra sternal images but color flow does not show vena  contracta and incompolete AR doppler signals Can consider another f/u TEE  or other modality like MRI to follow . The   aortic valve was not well visualized. Aortic valve regurgitation is  moderate. No aortic stenosis is present.   7. Aortic dilatation noted. There is moderate dilatation of the ascending  aorta, measuring 42 mm.   8. The inferior vena cava is normal in size with greater than 50%  respiratory variability, suggesting right atrial pressure of 3 mmHg.  TEE 01/29/2021: 1. DCC attempted x 4 with 200J biphasic and AP manual compression Patient  failed to convert to sinus.   2. Left ventricular ejection  fraction, by estimation, is 30 to 35%. The  left ventricle has moderately decreased function. The left ventricle  demonstrates global hypokinesis. The left ventricular internal cavity size  was severely dilated.   3. Right ventricular systolic function is normal. The right ventricular  size is normal.   4. Extensive 3D imaging of LAA performed. Left atrial size was severely  dilated. No left atrial/left atrial appendage thrombus was detected.   5. Right atrial size was mildly dilated.   6. 3 D imaging MV performed . The mitral valve is normal in structure.  Mild mitral valve regurgitation.   7. Tricuspid valve regurgitation is moderate.   8. The aortic valve is tricuspid. Aortic valve regurgitation is severe.   Echo: 01/28/21 IMPRESSIONS   1. Left ventricular ejection fraction, by estimation, is 35-40%. The left  ventricle has moderately decreased function. Left ventricular endocardial  border not optimally defined to evaluate regional wall motion, even with  Definity contrast. There is mild   concentric left ventricular hypertrophy. Left ventricular diastolic  function could not be evaluated.   2. Right ventricular systolic function is normal. The right ventricular  size is normal. There is mildly elevated pulmonary artery systolic  pressure.   3. Left atrial size was severely dilated.   4. Right atrial size was moderately dilated.   5. The mitral valve is normal in structure. No evidence of mitral valve  regurgitation. No evidence of mitral stenosis.   6. The aortic valve is normal in structure. Aortic valve regurgitation is  not visualized. No aortic stenosis is present.   7. There is mild dilatation of the aortic root, measuring 46 mm. There is  mild dilatation of the ascending aorta, measuring 41 mm.   8. The inferior vena cava is dilated in size with <50% respiratory  variability, suggesting right atrial pressure of 15 mmHg.    EKG:  EKG is personally reviewed. 02/20/2022:  EKG was not ordered. 10/24/2021 Gillian Shields, NP): NSR 92 bpm IVCD with QTc 467. Stable compared to previous. No acute ST?T wave changes.  Recent Labs: 11/10/2021: Magnesium 2.0 02/21/2022: ALT 22; Hemoglobin 16.7; Platelets 188 03/06/2022: BUN 27; Creatinine, Ser 1.34; Potassium 4.4; Sodium 139   Recent Lipid Panel    Component Value Date/Time   CHOL 150 04/22/2022 0826   TRIG 155 (H) 04/22/2022 0826   HDL 31 (L) 04/22/2022 0826   CHOLHDL 4.8 04/22/2022 0826   CHOLHDL 5.5 01/28/2021 0039   VLDL 33 01/28/2021 0039   LDLCALC 92 04/22/2022 0826   LDLCALC 127 (H) 12/19/2018 0743     Risk Assessment/Calculations:    CHA2DS2-VASc Score =    This indicates a  % annual risk of stroke. The patient's score is based upon:      Physical Exam:    VS:  There were no vitals taken for this visit.    Wt Readings from Last 3 Encounters:  04/22/22 295 lb (133.8 kg)  02/21/22 299 lb 4.4 oz (135.7 kg)  02/20/22 299 lb 3.2 oz (135.7 kg)     GEN: Well nourished, well developed in no acute distress HEENT: Normal NECK: No JVD; No carotid bruits CARDIAC: RRR, 1/6 diastolic murmur, no rubs, no gallops RESPIRATORY:  Clear to auscultation without rales, wheezing or rhonchi  ABDOMEN:  Morbidly obese, soft, non-tender, non-distended MUSCULOSKELETAL:  No edema; No deformity  SKIN: Warm and dry NEUROLOGIC:  Alert and oriented x 3 PSYCHIATRIC:  Normal affect   ASSESSMENT:    No diagnosis found.   PLAN:    In order of problems listed above:  #Severe Aortic regurgitation Noted on TEE in 01/2021 with mild dilatation of the aortic root, measuring 46 mm, mild dilatation of the ascending aorta, measuring 41 mm. CMR with confirmed severe AR, mild LV dilation. Relatively asymptomatic, however, given mild LV dilation, EF 54%, and severe AR, was referred to CT surgery. Recommended for continued surveillance.  --Continue serial monitoring with TTEs every 16months --Followed by Dr. Laneta Simmers with CT  surgery  #Paroxysmal A fib with RVR:  CHADs-vasc 2. Patient recently presented to [facility] for progressive SOB x2 weeks, intermittent orthopnea, lower extremity edema, and fatigue found to have new onset atrial fibrillation with RVR with HR 170s.  TTE with LVEF 35-40%, unable assess RWMA and diastolic function, RV normal, LA severely dilated,  RA moderately dilated,  mild dilatation of the aortic root, measuring 46 mm, mild dilatation of the ascending aorta, measuring 41 mm. S/p failed coversion from TEE with DCCV x4 on 01/29/21. EP consulted and patient underwent Tikosyn BID load with successful conversion to NSR. Repeat TTE 04/21/21 showed improved LVEF 45-50% with apical hypokinesis, severe LAE. 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 Initial EF 35-40% that improved to 45-50% on TTE in 04/2021. CMR 06/2021 with LVEF 54%. Currently euvolemic and doing well.  --TTE 01/2021  EF 35-40%, Severe LAE, Mod RAE, normal RV. This was followed by a TEE  with EF 30-35%, Severe LAE, mild MR, Mod TR, severe AI. --TTE 04/2021 with improved LVEF 45-50% --CMR 06/2021 with LVEF 54% --Continue Entresto to 97/103 mg BID --Continue spironolactone 12.5mg  daily  --Continue metoprolol 100mg  XL daily --Continue torsemide 20mg  as needed; has been euvolemic (currently taking 2 times per week) --Continue Farxiga 10mg  daily --Low Na diet --Monitor daily weights   #Monomorphic VT --Continue metop succinate 100mg  XL daily  --Follow-up with EP   #Moderate dilation of the  aorta: Aortic root 46mm, ascending aorta 41mm in 06/2021. Repeat TTE 02/2022 showed root 47mm, ascending aorta 38mm. -Follows with Dr. Laneta Simmers -Follow with serial TTEs/CTs  #HTN: Mildly elevated today and mainly 130s at home.  --Continue Entresto to 97/103 mg  BID --Continue metop  XL daily --Continue spiro  daily   #Morbid obesity  --Continue weight loss efforts --Declined GLP-1 agonist --Continue to work with trainer; goal per week   #OSA: --Continue CPAP    Follow-up in 3-4 months.  Medication Adjustments/Labs and Tests Ordered: Current medicines are reviewed at length with the patient today.  Concerns regarding medicines are outlined above.   No orders of the defined types were placed in this encounter.  No orders of the defined types were placed in this encounter.  There are no Patient Instructions on file for this visit.   I,Mathew Stumpf,acting as a Neurosurgeon for Meriam Sprague, MD.,have documented all relevant documentation on the behalf of Meriam Sprague, MD,as directed by  Meriam Sprague, MD while in the presence of Meriam Sprague, MD.   I, Meriam Sprague, MD, have reviewed all documentation for this visit. The documentation on 05/19/22 for the exam, diagnosis, procedures, and orders are all accurate and complete.   Signed, Meriam Sprague, MD  05/19/2022 2:45 PM    [facility]

## 2022-05-25 ENCOUNTER — Ambulatory Visit: Payer: Commercial Managed Care - PPO | Admitting: Cardiology

## 2022-06-03 ENCOUNTER — Other Ambulatory Visit: Payer: Commercial Managed Care - PPO

## 2022-06-04 ENCOUNTER — Other Ambulatory Visit: Payer: Commercial Managed Care - PPO

## 2022-06-17 ENCOUNTER — Other Ambulatory Visit: Payer: Commercial Managed Care - PPO

## 2022-06-24 ENCOUNTER — Other Ambulatory Visit: Payer: Commercial Managed Care - PPO

## 2022-07-01 ENCOUNTER — Other Ambulatory Visit: Payer: Commercial Managed Care - PPO

## 2022-07-17 NOTE — Progress Notes (Unsigned)
Cardiology Office Note:    Date:  07/17/2022   ID:  [name], DOB 18-Jun-1966, MRN [id number]  PCP:  Agapito Games, MD   [facility] Providers Cardiologist:  Meriam Sprague, MD Electrophysiologist:  Lewayne Bunting, MD  {    Referring MD: Agapito Games, *    History of Present Illness:    Joe Morrison is a 56 y.o. male with a hx atrial fibrillation with failed DCCV now on tikosyn and  combined systolic and diastolic heart failure, hypertension, severe aortic regurgitation, and morbid obesity who presents to clinic for follow-up.  Patient was admitted from 01/27/21-02/01/21 where he presented with worsening shortness of breath and volume overload found to have new onest atrial fibrillation with RVR as well was new acute systolic and diastolic heart failure with LVEF 35-40%. BNP on admission 475. He was diuresed with improvement and underwent DCCV, however, failed to return to NSR. EP was consulted and he subsequently underwent tikosyn load with successful return to NSR.    Seen in clinic on 02/25/21 where he was clinically improving. No HF or anginal symptoms. TTE 04/21/21 showed LVEF 45-50%, moderately dilated, mild LVH, severe LAE, at least moderate AR, ascending aorta 37mm. Recommended for CMR which is now pending.  He had cardiac MRI 06/21/2021 showing LVEF had returned to normal with LVEF 54%, severe AI, dilation of aortic root 46 mm, dilation of ascending aorta 41 mm, moderate TR, mild MR.  Was seen in clinic on 02/20/22 where we recommended consultation with CV surgery due to severe AR with mild LV dilation and EF 54%.   Saw Dr. Laneta Simmers on 04/22/22 where he was recommended for continued surveillance.   Today, *** Past Medical History:  Diagnosis Date   Alcoholic (HCC)    recovering since 10/1999   Ascending aorta dilatation (HCC)    78mm in ascending aorta by CTA 03/2022   Combined systolic and diastolic congestive heart failure (HCC)    a. Echo  4/22 LVEF 35-40%, unable assess RWMA and diastolic function, RV normal, LA severely dilated,  mod RAE, mild dilatation of the aortic root, measuring 46 mm, mild dilatation of the ascending aorta, 41 mm; b. Echo 7/22 improved EF of 45 to 50% up from 35 to 40%, apical hypokinesis, severe LAE, moderate RAE, moderate AV regurgitation, mod dilatation of the ascendig aorta, 42 mm.   Former tobacco use    Hypertension    Morbid obesity (HCC)    OSA (obstructive sleep apnea)    Persistent atrial fibrillation (HCC)    S/p failed DCCV 4/22, on Tikosyn c/ restoration of NSR, Eliquis.   Severe aortic regurgitation    a. Echo 7/22 c/ mod AR; b. cMRI 9/22 c/ severe AR.   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    Past Surgical History:  Procedure Laterality Date   CARDIOVERSION N/A 01/29/2021   Procedure: CARDIOVERSION;  Surgeon: Wendall Stade, MD;  Location: [facility] ENDOSCOPY;  Service: Cardiovascular;  Laterality: N/A;   MOUTH SURGERY     Dental work   TEE WITHOUT CARDIOVERSION N/A 01/29/2021   Procedure: TRANSESOPHAGEAL ECHOCARDIOGRAM (TEE);  Surgeon: Wendall Stade, MD;  Location: [facility] ENDOSCOPY;  Service: Cardiovascular;  Laterality: N/A;   TONSILLECTOMY  1972    Current Medications: No outpatient medications have been marked as taking for the 07/22/22 encounter (Appointment) with Meriam Sprague, MD.     Allergies:   Patient has no known allergies.   Social  History   Socioeconomic History   Marital status: Single    Spouse name: Not on file   Number of children: 0   Years of education: Not on file   Highest education level: Some college, no degree  Occupational History   Occupation: Engineer, drilling  Tobacco Use   Smoking status: Former    Packs/day: 2.00    Years: 16.00    Total pack years: 32.00    Types: Cigarettes    Quit date: 04/27/1999    Years since quitting: 23.2   Smokeless tobacco: Never  Vaping Use   Vaping Use: Never used   Substance and Sexual Activity   Alcohol use: Not Currently    Comment: Former Advice worker in 06/3234. former heavy drinker 20beers/night.   Drug use: Not Currently    Types: Marijuana, MDMA (Ecstacy), Oxycodone    Comment: stopped in 2001   Sexual activity: Not Currently    Partners: Male    Birth control/protection: None  Other Topics Concern   Not on file  Social History Narrative   Working out daily with a Psychologist, educational. No partner   Social Determinants of Health   Financial Resource Strain: Low Risk  (01/29/2021)   Overall Financial Resource Strain (CARDIA)    Difficulty of Paying Living Expenses: Not hard at all  Food Insecurity: No Food Insecurity (01/29/2021)   Hunger Vital Sign    Worried About Running Out of Food in the Last Year: Never true    Ran Out of Food in the Last Year: Never true  Transportation Needs: No Transportation Needs (01/29/2021)   PRAPARE - Administrator, Civil Service (Medical): No    Lack of Transportation (Non-Medical): No  Physical Activity: Sufficiently Active (01/29/2021)   Exercise Vital Sign    Days of Exercise per Week: 3 days    Minutes of Exercise per Session: 60 min  Stress: Stress Concern Present (01/29/2021)   [employer] of Occupational Health - Occupational Stress Questionnaire    Feeling of Stress : Rather much  Social Connections: Not on file     Family History: The patient's family history includes Alcohol abuse in an other family member; Diabetes in his mother; Heart attack (age of onset: 56) in his father; Hypertension in his mother.  ROS:   Please see the history of present illness.    Review of Systems  Constitutional:  Negative for diaphoresis and malaise/fatigue.  HENT:  Negative for hearing loss, nosebleeds and sore throat.   Eyes:  Negative for blurred vision and double vision.  Respiratory:  Negative for sputum production, shortness of breath and wheezing.   Cardiovascular:  Positive for chest pain (Dull  ache) and leg swelling (occasional). Negative for palpitations, orthopnea, claudication and PND.  Gastrointestinal:  Negative for heartburn, nausea and vomiting.  Genitourinary:  Negative for dysuria and hematuria.  Musculoskeletal:  Negative for falls and joint pain.  Neurological:  Negative for dizziness, seizures, loss of consciousness and headaches.  Endo/Heme/Allergies:  Does not bruise/bleed easily.  Psychiatric/Behavioral:  Negative for depression and memory loss. The patient is nervous/anxious.      EKGs/Labs/Other Studies Reviewed:    The following studies were reviewed today:  Echo 02/18/2022: Sonographer Comments: Patient is morbidly obese, suboptimal parasternal  window and suboptimal apical window. Image acquisition challenging due to  patient body habitus.  IMPRESSIONS    1. Left ventricular ejection fraction, by estimation, is 50 to 55%. The  left ventricle has low normal function. The left  ventricle has no regional  wall motion abnormalities. There is severe left ventricular hypertrophy.  Left ventricular diastolic  parameters are consistent with Grade I diastolic dysfunction (impaired  relaxation).   2. Right ventricular systolic function is normal. The right ventricular  size is normal.   3. Left atrial size was severely dilated.   4. The mitral valve is normal in structure. No evidence of mitral valve  regurgitation. No evidence of mitral stenosis.   5. The aortic valve is normal in structure. Aortic valve regurgitation is  severe. No aortic stenosis is present.   6. Aortic dilatation noted. There is moderate dilatation of the aortic  root, measuring 47 mm. There is borderline dilatation of the ascending  aorta, measuring 38 mm.   7. The inferior vena cava is normal in size with greater than 50%  respiratory variability, suggesting right atrial pressure of 3 mmHg.   Comparison(s): EF 45%, apical hypokinesis, mild LVH, mod AI, AOR 42mm.   CMR 06/20/21:    FINDINGS: Left ventricle:   -Mild hypertrophy   -Mild dilatation   -Normal systolic function   -Normal ECV (28%)   -No LGE   LV EF: 54% (Normal 56-78%)   Absolute volumes:   LV EDV: 272mL (Normal 77-195 mL)   LV ESV: 125mL (Normal 19-72 mL)   LV SV: 147mL (Normal 51-133 mL)   CO: 9.1L/min (Normal 2.8-8.8 L/min)   Indexed volumes:   LV EDV: 17105mL/sq-m (Normal 47-92 mL/sq-m)   LV ESV: 10148mL/sq-m (Normal 13-30 mL/sq-m)   LV SV: 5057mL/sq-m (Normal 32-62 mL/sq-m)   CI: 3.6L/min/sq-m (Normal 1.7-4.2 L/min/sq-m)   Right ventricle: Normal size and systolic function   RV EF:  58% (Normal 47-74%)   Absolute volumes:   RV EDV: 148mL (Normal 88-227 mL)   RV ESV: 62mL (Normal 23-103 mL)   RV SV: 86mL (Normal 52-138 mL)   CO: 5.3L/min (Normal 2.8-8.8 L/min)   Indexed volumes:   RV EDV: 5357mL/sq-m (Normal 55-105 mL/sq-m)   RV ESV: 3024mL/sq-m (Normal 15-43 mL/sq-m)   RV SV: 3433mL/sq-m (Normal 32-64 mL/sq-m)   CI: 2.1L/min/sq-m (Normal 1.7-4.2 L/min/sq-m)   Left atrium: Moderate enlargement   Right atrium: Normal size   Mitral valve: Mild regurgitation (regurgitant fraction 17%)   Aortic valve: Severe regurgitation (regurgitant fraction 51%)   Tricuspid valve: Moderate regurgitation (regurgitant fraction 22%)   Pulmonic valve: No regurgitation   Aorta: Aortic root measures 46mm, ascending aorta measures 41mm, aortic arch measures 35mm, descending aorta measures 26mm   Pericardium: Normal   IMPRESSION: 1.  Severe aortic valve regurgitation (regurgitant fraction 51%)   2. 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 aorta measuring 41mm   3. Mild LV dilatation, mild hypertrophy, and normal systolic function (EF 54%)   4.  Normal RV size and systolic function (EF 58%)   5.  No late gadolinium enhancement to suggest myocardial scar   6.  Moderate tricuspid regurgitation (regurgitant fraction 22%)   7.  Mild mitral regurgitation  (regurgitant fraction 17%)  TTE 04/21/21: IMPRESSIONS   1. Patient in NSR compared to recent TEE done 01/29/21.   2. Apical hypokinesis . Left ventricular ejection fraction, by  estimation, is 45 to 50%. The left ventricle has mildly decreased  function. The left ventricle demonstrates regional wall motion  abnormalities (see scoring diagram/findings for description).   The left ventricular internal cavity size was moderately dilated. There  is mild left ventricular hypertrophy. Left ventricular diastolic  parameters were normal.   3. Right  ventricular systolic function is normal. The right ventricular  size is normal.   4. Left atrial size was severely dilated.   5. The mitral valve is normal in structure. No evidence of mitral valve  regurgitation. No evidence of mitral stenosis.   6. Degree of AR poorly characterized. Appears to be holosystolic flow  reversal in supra sternal images but color flow does not show vena  contracta and incompolete AR doppler signals Can consider another f/u TEE  or other modality like MRI to follow . The   aortic valve was not well visualized. Aortic valve regurgitation is  moderate. No aortic stenosis is present.   7. Aortic dilatation noted. There is moderate dilatation of the ascending  aorta, measuring 42 mm.   8. The inferior vena cava is normal in size with greater than 50%  respiratory variability, suggesting right atrial pressure of 3 mmHg.  TEE 01/29/2021: 1. [place] attempted x 4 with 200J biphasic and AP manual compression Patient  failed to convert to sinus.   2. Left ventricular ejection fraction, by estimation, is 30 to 35%. The  left ventricle has moderately decreased function. The left ventricle  demonstrates global hypokinesis. The left ventricular internal cavity size  was severely dilated.   3. Right ventricular systolic function is normal. The right ventricular  size is normal.   4. Extensive 3D imaging of LAA performed. Left atrial  size was severely  dilated. No left atrial/left atrial appendage thrombus was detected.   5. Right atrial size was mildly dilated.   6. 3 D imaging MV performed . The mitral valve is normal in structure.  Mild mitral valve regurgitation.   7. Tricuspid valve regurgitation is moderate.   8. The aortic valve is tricuspid. Aortic valve regurgitation is severe.   Echo: 01/28/21 IMPRESSIONS   1. Left ventricular ejection fraction, by estimation, is 35-40%. The left  ventricle has moderately decreased function. Left ventricular endocardial  border not optimally defined to evaluate regional wall motion, even with  Definity contrast. There is mild   concentric left ventricular hypertrophy. Left ventricular diastolic  function could not be evaluated.   2. Right ventricular systolic function is normal. The right ventricular  size is normal. There is mildly elevated pulmonary artery systolic  pressure.   3. Left atrial size was severely dilated.   4. Right atrial size was moderately dilated.   5. The mitral valve is normal in structure. No evidence of mitral valve  regurgitation. No evidence of mitral stenosis.   6. The aortic valve is normal in structure. Aortic valve regurgitation is  not visualized. No aortic stenosis is present.   7. There is mild dilatation of the aortic root, measuring 46 mm. There is  mild dilatation of the ascending aorta, measuring 41 mm.   8. The inferior vena cava is dilated in size with <50% respiratory  variability, suggesting right atrial pressure of 15 mmHg.    EKG:  EKG is personally reviewed. 02/20/2022: EKG was not ordered. 10/24/2021 Laurann Montana, NP): NSR 92 bpm IVCD with QTc 467. Stable compared to previous. No acute ST?T wave changes.    Recent Labs: 11/10/2021: Magnesium 2.0 02/21/2022: ALT 22; Hemoglobin 16.7; Platelets 188 03/06/2022: BUN 27; Creatinine, Ser 1.34; Potassium 4.4; Sodium 139   Recent Lipid Panel    Component Value Date/Time   CHOL  150 04/22/2022 0826   TRIG 155 (H) 04/22/2022 0826   HDL 31 (L) 04/22/2022 0826   CHOLHDL 4.8 04/22/2022 1610  CHOLHDL 5.5 01/28/2021 0039   VLDL 33 01/28/2021 0039   LDLCALC 92 04/22/2022 0826   LDLCALC 127 (H) 12/19/2018 0743     Risk Assessment/Calculations:    CHA2DS2-VASc Score =    This indicates a  % annual risk of stroke. The patient's score is based upon:      Physical Exam:    VS:  There were no vitals taken for this visit.    Wt Readings from Last 3 Encounters:  04/22/22 295 lb (133.8 kg)  02/21/22 299 lb 4.4 oz (135.7 kg)  02/20/22 299 lb 3.2 oz (135.7 kg)     GEN: Well nourished, well developed in no acute distress HEENT: Normal NECK: No JVD; No carotid bruits CARDIAC: RRR, 1/6 diastolic murmur, no rubs, no gallops RESPIRATORY:  Clear to auscultation without rales, wheezing or rhonchi  ABDOMEN:  Morbidly obese, soft, non-tender, non-distended MUSCULOSKELETAL:  No edema; No deformity  SKIN: Warm and dry NEUROLOGIC:  Alert and oriented x 3 PSYCHIATRIC:  Normal affect   ASSESSMENT:    No diagnosis found.   PLAN:    In order of problems listed above:  #Severe Aortic regurgitation Noted on TEE in 01/2021 with mild dilatation of the aortic root, measuring 46 mm, mild dilatation of the ascending aorta, measuring 41 mm. CMR with confirmed severe AR, mild LV dilation. Relatively asymptomatic, however, given mild LV dilation, EF 54%, and severe AR, was referred to Dr. Laneta Simmers for further evaluation. Currently recommended for continued surveillance with repeat echoes every 6 months. -Continue serial monitoring with repeat TTEs every 6 months -Follow-up with Dr. Laneta Simmers as scheduled  #Paroxysmal A fib with RVR:  CHADs-vasc 2. Patient presented to [facility] for progressive SOB x2 weeks, intermittent orthopnea, lower extremity edema, and fatigue found to have new onset atrial fibrillation with RVR with HR 170s in 01/2021.  TTE with LVEF 35-40%, unable assess RWMA and  diastolic function, RV normal, LA severely dilated,  RA moderately dilated,  mild dilatation of the aortic root, measuring 46 mm, mild dilatation of the ascending aorta, measuring 41 mm. S/p failed coversion from TEE with DCCV x4 on 01/29/21. EP consulted and patient underwent Tikosyn BID load with successful conversion to NSR. Repeat TTE 04/21/21 showed improved LVEF 45-50% with apical hypokinesis, severe LAE. 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 Initial EF 35-40% that improved to 45-50% on TTE in 04/2021. CMR 06/2021 with LVEF 54%. Currently euvolemic and doing well.  --TTE 01/2021  EF 35-40%, Severe LAE, Mod RAE, normal RV. This was followed by a TEE  with EF 30-35%, Severe LAE, mild MR, Mod TR, severe AI. --TTE 04/2021 with improved LVEF 45-50% --CMR 06/2021 with LVEF 54% --Continue Entresto to 97/103 mg BID --Continue spironolactone 12.5mg  daily  --Continue metoprolol 100mg  XL daily --Continue torsemide 20mg  as needed; has been euvolemic (currently taking 2 times per week) --Continue Farxiga 10mg  daily --Low Na diet --Monitor daily weights --Check BMET   #Monomorphic VT --Continue metop succinate 100mg  XL daily  --Follow-up with EP   #Moderate dilation of the aorta: Aortic root 44mm, ascending aorta 55mm in 06/2021. Repeat TTE 02/2022 showed root 34mm, ascending aorta 30mm. -Continue serial monitoring as above  #HTN: *** --Continue Entresto to 97/103 mg BID --Continue metop 100mg  XL daily --Continue spiro to 25mg  daily --BMET next week   #Morbid  obesity  --Continue weight loss efforts --Declined GLP-1 agonist --Continue to work with trainer; goal per week   #OSA: --Continue CPAP    Follow-up in 3-4 months.  Medication Adjustments/Labs and Tests Ordered: Current medicines are  reviewed at length with the patient today.  Concerns regarding medicines are outlined above.   No orders of the defined types were placed in this encounter.  No orders of the defined types were placed in this encounter.  There are no Patient Instructions on file for this visit.   I,Mathew Stumpf,acting as a Neurosurgeon for Meriam Sprague, MD.,have documented all relevant documentation on the behalf of Meriam Sprague, MD,as directed by  Meriam Sprague, MD while in the presence of Meriam Sprague, MD.   I, Meriam Sprague, MD, have reviewed all documentation for this visit. The documentation on 07/17/22 for the exam, diagnosis, procedures, and orders are all accurate and complete.   Signed, Meriam Sprague, MD  07/17/2022 2:22 PM    [facility]

## 2022-07-20 ENCOUNTER — Encounter: Payer: Self-pay | Admitting: Cardiology

## 2022-07-22 ENCOUNTER — Encounter: Payer: Self-pay | Admitting: Cardiology

## 2022-07-22 ENCOUNTER — Ambulatory Visit: Payer: Commercial Managed Care - PPO | Attending: Cardiology | Admitting: Cardiology

## 2022-07-22 VITALS — BP 120/66 | HR 82 | Ht 69.0 in | Wt 312.0 lb

## 2022-07-22 DIAGNOSIS — D6869 Other thrombophilia: Secondary | ICD-10-CM

## 2022-07-22 DIAGNOSIS — I351 Nonrheumatic aortic (valve) insufficiency: Secondary | ICD-10-CM | POA: Diagnosis not present

## 2022-07-22 DIAGNOSIS — I712 Thoracic aortic aneurysm, without rupture, unspecified: Secondary | ICD-10-CM

## 2022-07-22 DIAGNOSIS — I4729 Other ventricular tachycardia: Secondary | ICD-10-CM

## 2022-07-22 DIAGNOSIS — I10 Essential (primary) hypertension: Secondary | ICD-10-CM

## 2022-07-22 DIAGNOSIS — E785 Hyperlipidemia, unspecified: Secondary | ICD-10-CM | POA: Diagnosis not present

## 2022-07-22 DIAGNOSIS — I4819 Other persistent atrial fibrillation: Secondary | ICD-10-CM

## 2022-07-22 DIAGNOSIS — E782 Mixed hyperlipidemia: Secondary | ICD-10-CM

## 2022-07-22 DIAGNOSIS — I5042 Chronic combined systolic (congestive) and diastolic (congestive) heart failure: Secondary | ICD-10-CM

## 2022-07-22 NOTE — Patient Instructions (Signed)
Medication Instructions:   Your physician recommends that you continue on your current medications as directed. Please refer to the Current Medication list given to you today.  *If you need a refill on your cardiac medications before your next appointment, please call your pharmacy*   Lab Work:  [place] AS WHEN YOUR ECHO IS [place] AT THAT TIME--PLEASE COME FASTING TO THIS LAB APPOINTMENT  If you have labs (blood work) drawn today and your tests are completely normal, you will receive your results only by: [place] (if you have MyChart) OR A paper copy in the mail If you have any lab test that is abnormal or we need to change your treatment, we will call you to review the results.   Testing/Procedures:  Your physician has requested that you have an echocardiogram. Echocardiography is a painless test that uses sound waves to create images of your heart. It provides your doctor with information about the size and shape of your heart and how well your heart's chambers and valves are working. This procedure takes approximately one hour. There are no restrictions for this procedure.  SCHEDULE TO BE DONE IN NOVEMBER 2023 ALONG WITH LIPIDS SAME DAY PER DR. Johney Frame     Follow-Up: At [facility], you and your health needs are our priority.  As part of our continuing mission to provide you with exceptional heart care, we have created designated Provider Care Teams.  These Care Teams include your primary Cardiologist (physician) and Advanced Practice Providers (APPs -  Physician Assistants and Nurse Practitioners) who all work together to provide you with the care you need, when you need it.  We recommend signing up for the patient portal called "MyChart".  Sign up information is provided on this After Visit Summary.  MyChart is used to connect with patients for Virtual Visits (Telemedicine).  Patients are able to view lab/test results, encounter notes,  upcoming appointments, etc.  Non-urgent messages can be sent to your provider as well.   To learn more about what you can do with MyChart, go to [email or web address].    Your next appointment:   6 month(s)  The format for your next appointment:   In Person  Provider:   Freada Bergeron, MD      Important Information About Sugar

## 2022-08-05 IMAGING — MR MR CARD MORPHOLOGY WO/W CM
45 of 48 series · 45 of 48 positions shown · IV contrast (gadavist)
Comparison: none

CLINICAL DATA: Evaluate AI, aortic dilatation

EXAM:
CARDIAC MRI
TECHNIQUE: The patient was scanned on a 1.5 Tesla Siemens magnet. A dedicated
cardiac coil was used. Functional imaging was done using [employer]iest[employer]
sequences. [DATE], and 4 chamber views were done to assess for RWMA's.
Modified Ivona-Vasko rule using a short axis stack was used to
calculate an ejection fraction on a dedicated work station using
Circ[employer] software. The patient received 10 cc of Gadavist. After 10
minutes inversion recovery sequences were used to assess for
infiltration and scar tissue.
CONTRAST:  10 cc  of Gadavist

[Series 4: t2_haste_db_tra_bh · axial · 8.0mm · 1.56mm/px · 1 of 16 slices shown]
[im 1/16]
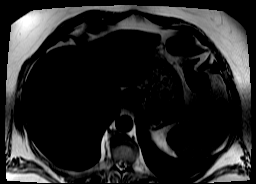

[Series 8: bSSFP · oblique · 8.0mm · 1.83mm/px · 1 of 25 slices shown (1 of 24)]
[im 1/25]
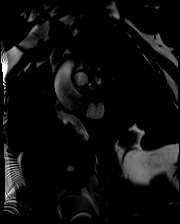

[Series 9: bSSFP · oblique · 8.0mm · 1.83mm/px · 1 of 25 slices shown (2 of 24)]
[im 1/25]
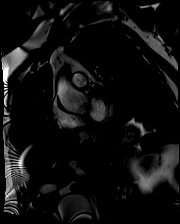

[Series 10: bSSFP · oblique · 8.0mm · 1.83mm/px · 1 of 25 slices shown (3 of 24)]
[im 1/25]
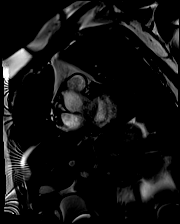

[Series 11: bSSFP · oblique · 8.0mm · 1.83mm/px · 1 of 25 slices shown (4 of 24)]
[im 1/25]
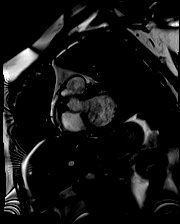

[Series 12: bSSFP · oblique · 8.0mm · 1.83mm/px · 1 of 25 slices shown (5 of 24)]
[im 1/25]
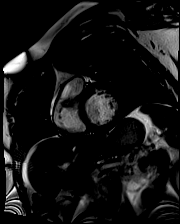

[Series 13: bSSFP · oblique · 8.0mm · 1.83mm/px · 1 of 25 slices shown (6 of 24)]
[im 1/25]
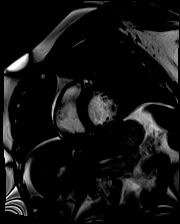

[Series 14: bSSFP · oblique · 8.0mm · 1.83mm/px · 1 of 25 slices shown (7 of 24)]
[im 1/25]
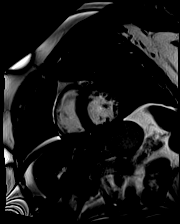

[Series 15: bSSFP · oblique · 8.0mm · 1.83mm/px · 1 of 25 slices shown (8 of 24)]
[im 1/25]
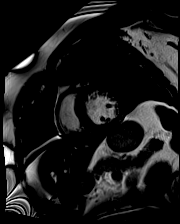

[Series 16: bSSFP · oblique · 8.0mm · 1.83mm/px · 1 of 25 slices shown (9 of 24)]
[im 1/25]
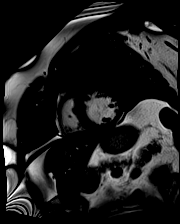

[Series 17: bSSFP · oblique · 8.0mm · 1.83mm/px · 1 of 25 slices shown (10 of 24)]
[im 1/25]
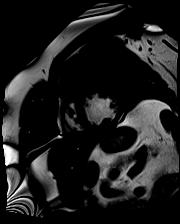

[Series 18: bSSFP · oblique · 8.0mm · 1.83mm/px · 1 of 25 slices shown (11 of 24)]
[im 1/25]
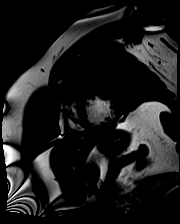

[Series 19: bSSFP · oblique · 8.0mm · 1.83mm/px · 1 of 25 slices shown (12 of 24)]
[im 1/25]
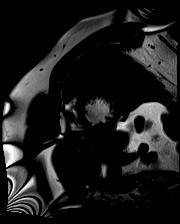

[Series 20: bSSFP · oblique · 8.0mm · 1.83mm/px · 1 of 25 slices shown (13 of 24)]
[im 1/25]
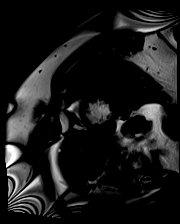

[Series 21: bSSFP · oblique · 8.0mm · 1.83mm/px · 1 of 25 slices shown (14 of 24)]
[im 1/25]
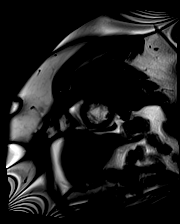

[Series 22: bSSFP · oblique · 8.0mm · 1.83mm/px · 1 of 25 slices shown (15 of 24)]
[im 1/25]
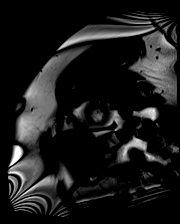

[Series 23: bSSFP · oblique · 8.0mm · 1.83mm/px · 1 of 25 slices shown (16 of 24)]
[im 1/25]
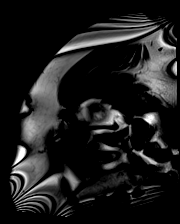

[Series 24: bSSFP · oblique · 8.0mm · 1.83mm/px · 1 of 25 slices shown (17 of 24)]
[im 1/25]
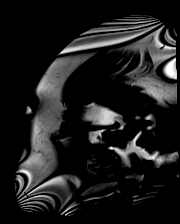

[Series 25: bSSFP · oblique · 8.0mm · 1.83mm/px · 1 of 25 slices shown (18 of 24)]
[im 1/25]
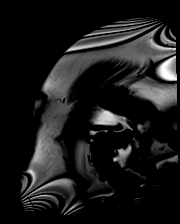

[Series 26: bSSFP · axial · 6.0mm · 1.41mm/px · 1 of 25 slices shown (19 of 24)]
[im 1/25]
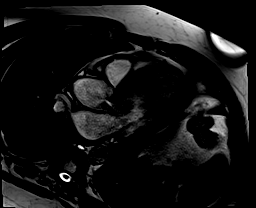

[Series 27: bSSFP · oblique · 6.0mm · 1.41mm/px · 1 of 25 slices shown (20 of 24)]
[im 1/25]
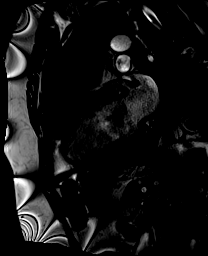

[Series 28: bSSFP · axial · 6.0mm · 1.64mm/px · 1 of 25 slices shown (21 of 24)]
[im 1/25]
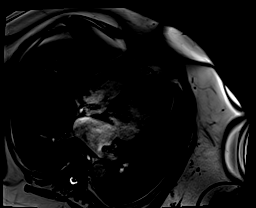

[Series 29: bSSFP · oblique · 6.0mm · 1.64mm/px · 1 of 25 slices shown (22 of 24)]
[im 1/25]
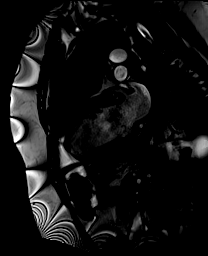

[Series 30: bSSFP · axial · 6.0mm · 1.64mm/px · 1 of 25 slices shown (23 of 24)]
[im 1/25]
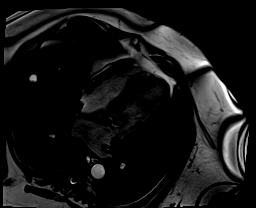

[Series 31: (id)_long_t1 · coronal · 8.0mm · 1.56mm/px · 1 of 24 slices shown]
[im 1/24]
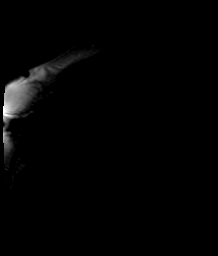

[Series 32: (id)_long_t1_moco · coronal · 8.0mm · 1.56mm/px · 1 of 24 slices shown]
[im 1/24]
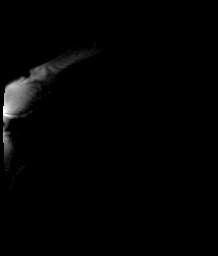

[Series 33: (id)_long_t1_moco_t1 · coronal · 8.0mm · 1.56mm/px · 1 of 6 slices shown]
[im 1/6]
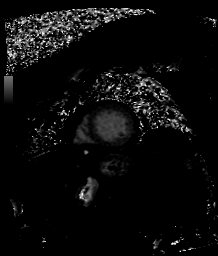

[Series 35: (id)_trufi · coronal · 8.0mm · 2.08mm/px · 1 of 9 slices shown]
[im 1/9]
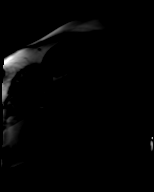

[Series 36: (id)_trufi_moco · coronal · 8.0mm · 2.08mm/px · 1 of 9 slices shown]
[im 1/9]
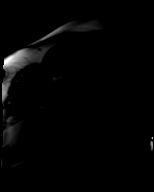

[Series 37: (id)_trufi_moco_t2 · coronal · 8.0mm · 2.08mm/px · 1 of 3 slices shown]
[im 1/3]
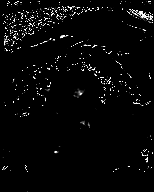

[Series 39: bSSFP · coronal · 6.0mm · 1.56mm/px · 1 of 25 slices shown (24 of 24)]
[im 1/25]
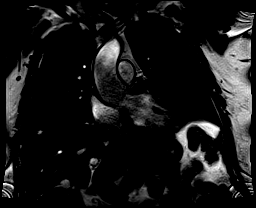

[Series 40: (person_name)_(person_name)_(person_name) · sagittal · 8.0mm · 2.01mm/px · 1 of 27 slices shown (1 of 7)]
[im 1/27]
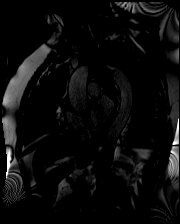

[Series 40: (person_name)_(person_name)_(person_name) · sagittal · 8.0mm · 2.01mm/px · 1 of 27 slices shown (2 of 7)]
[im 1/27]
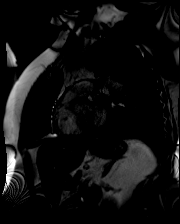

[Series 40: (person_name)_(person_name)_(person_name) · sagittal · 8.0mm · 2.01mm/px · 1 of 27 slices shown (3 of 7)]
[im 1/27]
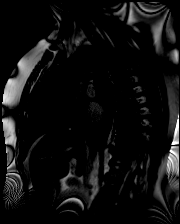

[Series 40: (person_name)_(person_name)_(person_name) · sagittal · 8.0mm · 2.01mm/px · 1 of 27 slices shown (4 of 7)]
[im 1/27]
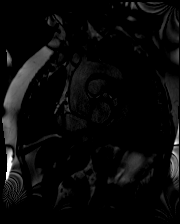

[Series 40: (person_name)_(person_name)_(person_name) · sagittal · 8.0mm · 2.01mm/px · 1 of 27 slices shown (5 of 7)]
[im 1/27]
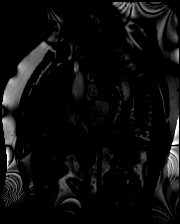

[Series 40: (person_name)_(person_name)_(person_name) · sagittal · 8.0mm · 2.01mm/px · 1 of 27 slices shown (6 of 7)]
[im 1/27]
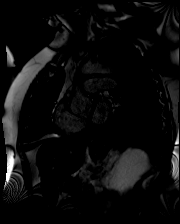

[Series 40: (person_name)_(person_name)_(person_name) · sagittal · 8.0mm · 2.01mm/px · 1 of 27 slices shown (7 of 7)]
[im 1/27]
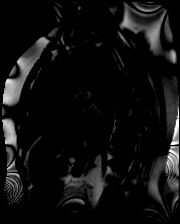

[Series 41: aortic valve flow_200_tp_retro_bh · sagittal · 6.0mm · 1.92mm/px · 1 of 24 slices shown (1 of 2)]
[im 1/24]
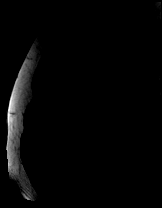

[Series 42: aortic valve flow_200_tp_retro_bh_mag · sagittal · 6.0mm · 1.92mm/px · 1 of 24 slices shown (1 of 2)]
[im 1/24]
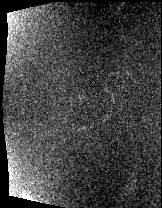

[Series 43: aortic valve flow_200_tp_retro_bh_p · sagittal · 6.0mm · 1.92mm/px · 1 of 24 slices shown (1 of 2)]
[im 1/24]
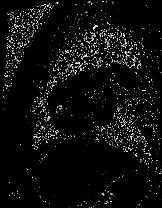

[Series 44: angio_fl3d_sag_pre · sagittal · 1.1mm · 1.17mm/px · 1 of 96 slices shown]
[im 1/96]
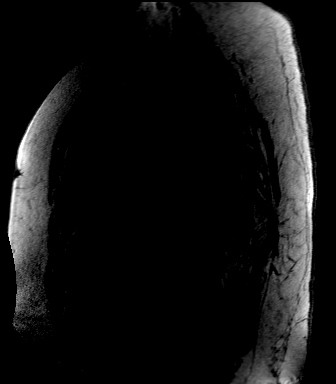

[Series 45: aortic valve flow_200_tp_retro_bh · sagittal · 6.0mm · 1.92mm/px · 1 of 24 slices shown (2 of 2)]
[im 1/24]
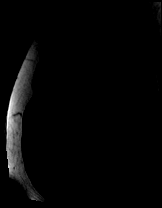

[Series 46: aortic valve flow_200_tp_retro_bh_mag · sagittal · 6.0mm · 1.92mm/px · 1 of 24 slices shown (2 of 2)]
[im 1/24]
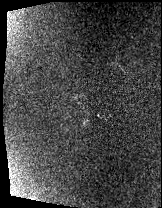

[Series 47: aortic valve flow_200_tp_retro_bh_p · sagittal · 6.0mm · 1.92mm/px · 1 of 24 slices shown (2 of 2)]
[im 1/24]
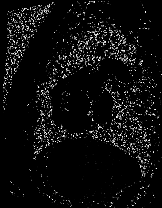

[45 of 48 positions shown; findings below may reference images not displayed]

FINDINGS: Left ventricle:

-Mild hypertrophy

-Mild dilatation

-Normal systolic function

-Normal ECV (28%)

-No LGE

LV EF: 54% (Normal 56-78%)

Absolute volumes:

LV EDV: 272mL (Normal 77-195 mL)

LV ESV: 125mL (Normal 19-72 mL)

LV SV: 147mL (Normal 51-133 mL)

CO: 9.1L/min (Normal 2.8-8.8 L/min)

Indexed volumes:

LV EDV: 105mL/sq-m (Normal 47-92 mL/sq-m)

LV ESV: 48mL/sq-m (Normal 13-30 mL/sq-m)

LV SV: 57mL/sq-m (Normal 32-62 mL/sq-m)

CI: 3.6L/min/sq-m (Normal 1.7-4.2 L/min/sq-m)

Right ventricle: Normal size and systolic function

RV EF:  58% (Normal 47-74%)

Absolute volumes:

RV EDV: 148mL (Normal 88-227 mL)

RV ESV: 62mL (Normal 23-103 mL)

RV SV: 86mL (Normal 52-138 mL)

CO: 5.3L/min (Normal 2.8-8.8 L/min)

Indexed volumes:

RV EDV: 57mL/sq-m (Normal 55-105 mL/sq-m)

RV ESV: 24mL/sq-m (Normal 15-43 mL/sq-m)

RV SV: 33mL/sq-m (Normal 32-64 mL/sq-m)

CI: 2.1L/min/sq-m (Normal 1.7-4.2 L/min/sq-m)

Left atrium: Moderate enlargement

Right atrium: Normal size

Mitral valve: Mild regurgitation (regurgitant fraction 17%)

Aortic valve: Severe regurgitation (regurgitant fraction 51%)

Tricuspid valve: Moderate regurgitation (regurgitant fraction 22%)

Pulmonic valve: No regurgitation

Aorta: Aortic root measures 46mm, ascending aorta measures 41mm,
aortic arch measures 35mm, descending aorta measures 26mm

Pericardium: Normal
IMPRESSION: 1.  Severe aortic valve regurgitation (regurgitant fraction 51%)

2. Dilatation of aortic root measuring 46mm. Dilatation of ascending
aorta measuring 41mm

3. Mild LV dilatation, mild hypertrophy, and normal systolic
function (EF 54%)

4.  Normal RV size and systolic function (EF 58%)

5.  No late gadolinium enhancement to suggest myocardial scar

6.  Moderate tricuspid regurgitation (regurgitant fraction 22%)

7.  Mild mitral regurgitation (regurgitant fraction 17%)

## 2022-08-14 ENCOUNTER — Other Ambulatory Visit (HOSPITAL_COMMUNITY): Payer: Commercial Managed Care - PPO

## 2022-08-14 ENCOUNTER — Other Ambulatory Visit: Payer: Commercial Managed Care - PPO

## 2022-08-28 ENCOUNTER — Ambulatory Visit: Payer: Commercial Managed Care - PPO

## 2022-08-28 ENCOUNTER — Ambulatory Visit (HOSPITAL_COMMUNITY): Payer: Commercial Managed Care - PPO

## 2022-09-18 ENCOUNTER — Other Ambulatory Visit: Payer: Commercial Managed Care - PPO

## 2022-09-18 ENCOUNTER — Other Ambulatory Visit (HOSPITAL_COMMUNITY): Payer: Commercial Managed Care - PPO

## 2022-10-09 ENCOUNTER — Other Ambulatory Visit (HOSPITAL_COMMUNITY): Payer: Commercial Managed Care - PPO

## 2022-10-09 ENCOUNTER — Other Ambulatory Visit: Payer: Commercial Managed Care - PPO

## 2022-10-09 ENCOUNTER — Encounter (HOSPITAL_COMMUNITY): Payer: Self-pay

## 2022-11-05 ENCOUNTER — Ambulatory Visit: Payer: Commercial Managed Care - PPO | Attending: Cardiology

## 2022-11-05 ENCOUNTER — Encounter (HOSPITAL_COMMUNITY): Payer: Self-pay

## 2022-11-05 ENCOUNTER — Ambulatory Visit (HOSPITAL_COMMUNITY): Payer: Commercial Managed Care - PPO | Attending: Cardiology

## 2022-11-05 ENCOUNTER — Encounter (HOSPITAL_COMMUNITY): Payer: Self-pay | Admitting: Cardiology

## 2022-12-04 ENCOUNTER — Other Ambulatory Visit: Payer: Self-pay

## 2022-12-04 DIAGNOSIS — Z79899 Other long term (current) drug therapy: Secondary | ICD-10-CM

## 2022-12-04 DIAGNOSIS — I351 Nonrheumatic aortic (valve) insufficiency: Secondary | ICD-10-CM

## 2022-12-04 DIAGNOSIS — I10 Essential (primary) hypertension: Secondary | ICD-10-CM

## 2022-12-04 MED ORDER — SPIRONOLACTONE 25 MG PO TABS: 25.0000 mg | ORAL_TABLET | Freq: Every day | ORAL | 2 refills | Status: DC

## 2022-12-21 ENCOUNTER — Other Ambulatory Visit (HOSPITAL_BASED_OUTPATIENT_CLINIC_OR_DEPARTMENT_OTHER): Payer: Self-pay

## 2022-12-21 DIAGNOSIS — Z79899 Other long term (current) drug therapy: Secondary | ICD-10-CM

## 2022-12-21 DIAGNOSIS — I5042 Chronic combined systolic (congestive) and diastolic (congestive) heart failure: Secondary | ICD-10-CM

## 2022-12-21 DIAGNOSIS — I4819 Other persistent atrial fibrillation: Secondary | ICD-10-CM

## 2022-12-21 DIAGNOSIS — I10 Essential (primary) hypertension: Secondary | ICD-10-CM

## 2022-12-21 MED ORDER — DAPAGLIFLOZIN PROPANEDIOL 10 MG PO TABS: 10.0000 mg | ORAL_TABLET | Freq: Every day | ORAL | 3 refills | Status: DC

## 2022-12-21 NOTE — Telephone Encounter (Signed)
Received fax from [facility] requesting refills for Farxiga. Rx request routed to [facility] refill pool.

## 2023-01-15 ENCOUNTER — Other Ambulatory Visit: Payer: Self-pay | Admitting: Family

## 2023-01-15 DIAGNOSIS — E785 Hyperlipidemia, unspecified: Secondary | ICD-10-CM

## 2023-01-15 DIAGNOSIS — Z79899 Other long term (current) drug therapy: Secondary | ICD-10-CM

## 2023-01-15 DIAGNOSIS — I4819 Other persistent atrial fibrillation: Secondary | ICD-10-CM

## 2023-01-15 MED ORDER — ROSUVASTATIN CALCIUM 20 MG PO TABS: 20.0000 mg | ORAL_TABLET | Freq: Every day | ORAL | 0 refills | Status: DC

## 2023-01-15 NOTE — Telephone Encounter (Signed)
Prescription refill request for Eliquis received. Indication: Afib  Last office visit: 07/22/22 Shari Prows)  Scr: 1.34 (03/06/22)  Age: 57 Weight: 141.5kg  Appropriate dose. Refill sent.

## 2023-01-22 ENCOUNTER — Ambulatory Visit (HOSPITAL_COMMUNITY): Payer: Commercial Managed Care - PPO | Attending: Cardiology

## 2023-01-22 ENCOUNTER — Ambulatory Visit (HOSPITAL_COMMUNITY): Payer: Commercial Managed Care - PPO

## 2023-01-22 ENCOUNTER — Encounter (HOSPITAL_COMMUNITY): Payer: Self-pay

## 2023-01-22 DIAGNOSIS — I712 Thoracic aortic aneurysm, without rupture, unspecified: Secondary | ICD-10-CM

## 2023-01-22 DIAGNOSIS — E785 Hyperlipidemia, unspecified: Secondary | ICD-10-CM

## 2023-01-22 DIAGNOSIS — I351 Nonrheumatic aortic (valve) insufficiency: Secondary | ICD-10-CM

## 2023-01-22 DIAGNOSIS — I10 Essential (primary) hypertension: Secondary | ICD-10-CM

## 2023-01-22 DIAGNOSIS — I4729 Other ventricular tachycardia: Secondary | ICD-10-CM

## 2023-01-22 DIAGNOSIS — I4819 Other persistent atrial fibrillation: Secondary | ICD-10-CM

## 2023-01-22 DIAGNOSIS — I5042 Chronic combined systolic (congestive) and diastolic (congestive) heart failure: Secondary | ICD-10-CM

## 2023-01-23 LAB — LIPID PANEL
Chol/HDL Ratio: 5.3 ratio — ABNORMAL HIGH (ref 0.0–5.0)
Cholesterol, Total: 168 mg/dL (ref 100–199)
HDL: 32 mg/dL — ABNORMAL LOW (ref >39)
LDL Chol Calc (NIH): 88 mg/dL (ref 0–99)
Triglycerides: 290 mg/dL — ABNORMAL HIGH (ref 0–149)
VLDL Cholesterol Cal: 48 mg/dL — ABNORMAL HIGH (ref 5–40)

## 2023-01-25 ENCOUNTER — Telehealth: Payer: Self-pay | Admitting: Cardiology

## 2023-01-25 ENCOUNTER — Encounter: Payer: Self-pay | Admitting: Cardiology

## 2023-01-25 DIAGNOSIS — Z79899 Other long term (current) drug therapy: Secondary | ICD-10-CM

## 2023-01-25 DIAGNOSIS — E782 Mixed hyperlipidemia: Secondary | ICD-10-CM

## 2023-01-25 DIAGNOSIS — E785 Hyperlipidemia, unspecified: Secondary | ICD-10-CM

## 2023-01-25 MED ORDER — EZETIMIBE 10 MG PO TABS: 10.0000 mg | ORAL_TABLET | Freq: Every day | ORAL | 1 refills | Status: DC

## 2023-01-25 MED ORDER — ICOSAPENT ETHYL 1 G PO CAPS: 2.0000 g | ORAL_CAPSULE | Freq: Two times a day (BID) | ORAL | 6 refills | Status: DC

## 2023-01-25 NOTE — Telephone Encounter (Signed)
-----   Message from Meriam Sprague, MD sent at 01/25/2023  8:20 AM EDT ----- His cholesterol remains elevated. Can we start zetia 10mg  daily to help drive UYQ<03 and vascepa 2g BID to help drive down his TG and repeat lipids in 3 months

## 2023-01-25 NOTE — Telephone Encounter (Signed)
Patient returned call for lab results.  

## 2023-01-25 NOTE — Telephone Encounter (Signed)
The patient has been notified of the result and verbalized understanding.  All questions (if any) were answered.  Pt aware to start taking zetia 10 mg po daily, as well as start taking vascepa 2 grams po BID, come in for repeat lipids in 3 months.   Confirmed the pharmacy of choice with the pt.  Scheduled the pts repeat lipids in 3 months on 04/26/23.  He is aware to come fasting to this lab appt.   Pt verbalized understanding and agrees with this plan.

## 2023-01-27 NOTE — Telephone Encounter (Signed)
Via, Lorelle Formosa, LPN  Loa Socks, LPN Lajoyce Corners, I saw that too so I am hoping that he has worked it out. 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  If not, since the pts plan will not cover Vascepa or Lovaza, Dr Shari Prows sent Joe Morrison, RPH-CPP  a message asking if Fenofibrate could replace Vascepa and this was her reply:  I don't think that fenofibrate will give him much benefit since he isn't diabetic. He should focus on diet and increasing physical activity.  So if the pt contacts [place], we can refer to Dr Shari Prows for advisement on another medication but it appears that there are none that his ins will cover. Also, you can always send to me and I will assist him. Thanks, [employer]

## 2023-02-01 ENCOUNTER — Telehealth (HOSPITAL_COMMUNITY): Payer: Self-pay | Admitting: Cardiology

## 2023-02-01 NOTE — Telephone Encounter (Signed)
Patient called to cancel echocardiogram that was scheduled for 02/05/23. I informed patient that we would reschedule this time but we would not be able to reschedule appointment again due to multiple NO SHOWS and cancellations. See List of dates below:  No showed 10/09/22 and 11/05/22. Patient cancelled: 08/14/22,08/28/22, 09/18/22, 01/22/23  Patient immediately told me that he would keep the appt and stated that the last appt was cancelled because we could not get the images. He  asked for my name which I gave to him and stated my job title.  Patient proceeded to hang up on me before finishing my sentence.

## 2023-02-05 ENCOUNTER — Ambulatory Visit (HOSPITAL_COMMUNITY): Payer: Commercial Managed Care - PPO | Attending: Cardiology

## 2023-02-05 DIAGNOSIS — I351 Nonrheumatic aortic (valve) insufficiency: Secondary | ICD-10-CM | POA: Diagnosis present

## 2023-02-05 DIAGNOSIS — I5042 Chronic combined systolic (congestive) and diastolic (congestive) heart failure: Secondary | ICD-10-CM | POA: Insufficient documentation

## 2023-02-05 LAB — ECHOCARDIOGRAM COMPLETE
Area-P 1/2: 2.95 cm2
P 1/2 time: 271 msec
S' Lateral: 3.2 cm

## 2023-02-08 ENCOUNTER — Telehealth: Payer: Self-pay | Admitting: *Deleted

## 2023-02-08 DIAGNOSIS — I712 Thoracic aortic aneurysm, without rupture, unspecified: Secondary | ICD-10-CM

## 2023-02-08 DIAGNOSIS — I77810 Thoracic aortic ectasia: Secondary | ICD-10-CM

## 2023-02-08 DIAGNOSIS — I071 Rheumatic tricuspid insufficiency: Secondary | ICD-10-CM

## 2023-02-08 DIAGNOSIS — I351 Nonrheumatic aortic (valve) insufficiency: Secondary | ICD-10-CM

## 2023-02-08 MED ORDER — PERFLUTREN LIPID MICROSPHERE
1.0000 mL | INTRAVENOUS | Status: AC | PRN
  Administered 2023-02-05: 3 mL via INTRAVENOUS

## 2023-02-08 NOTE — Telephone Encounter (Signed)
The patient has been notified of the result and verbalized understanding.  All questions (if any) were answered.  Pt aware we will do another echo on him in 6-12 months for surveillance.   He states he prefers having this done in 6 months.  Pt aware that I will place the order for repeat echo in 6 months in the system and send a message to our echo scheduler to call him back to arrange this appt.   Pt verbalized understanding and agrees with this plan.

## 2023-02-08 NOTE — Telephone Encounter (Signed)
-----   Message from Loa Socks, LPN sent at 01/18/8118  7:44 AM EDT -----  ----- Message ----- From: Meriam Sprague, MD Sent: 02/06/2023   8:16 PM EDT To: [facility] Triage  His echo shows normal pumping function and continued severe AR. There is trivial leakiness of his mitral valve and mild dilation of the aorta. Will continue with serial monitoring with repeat echoes every 17mo-1year

## 2023-03-22 ENCOUNTER — Other Ambulatory Visit: Payer: Self-pay | Admitting: *Deleted

## 2023-03-22 MED ORDER — DOFETILIDE 500 MCG PO CAPS: ORAL_CAPSULE | ORAL | 1 refills | Status: DC

## 2023-04-19 ENCOUNTER — Other Ambulatory Visit: Payer: Self-pay

## 2023-04-19 ENCOUNTER — Encounter: Payer: Self-pay | Admitting: Cardiology

## 2023-04-19 DIAGNOSIS — E785 Hyperlipidemia, unspecified: Secondary | ICD-10-CM

## 2023-04-19 DIAGNOSIS — I5042 Chronic combined systolic (congestive) and diastolic (congestive) heart failure: Secondary | ICD-10-CM

## 2023-04-19 DIAGNOSIS — Z79899 Other long term (current) drug therapy: Secondary | ICD-10-CM

## 2023-04-19 MED ORDER — ROSUVASTATIN CALCIUM 20 MG PO TABS: 20.0000 mg | ORAL_TABLET | Freq: Every day | ORAL | 1 refills | Status: DC

## 2023-04-19 MED ORDER — ENTRESTO 97-103 MG PO TABS: 1.0000 | ORAL_TABLET | Freq: Two times a day (BID) | ORAL | 1 refills | Status: DC

## 2023-04-19 MED ORDER — METOPROLOL SUCCINATE ER 100 MG PO TB24: 100.0000 mg | ORAL_TABLET | Freq: Every day | ORAL | 1 refills | Status: DC

## 2023-04-19 NOTE — Addendum Note (Signed)
Addended by: Margaret Pyle D on: 04/19/2023 03:05 PM   Modules accepted: Orders

## 2023-04-20 ENCOUNTER — Other Ambulatory Visit: Payer: Self-pay | Admitting: *Deleted

## 2023-04-20 DIAGNOSIS — I5042 Chronic combined systolic (congestive) and diastolic (congestive) heart failure: Secondary | ICD-10-CM

## 2023-04-20 MED ORDER — TORSEMIDE 20 MG PO TABS: ORAL_TABLET | ORAL | 3 refills | Status: DC

## 2023-04-26 ENCOUNTER — Ambulatory Visit: Payer: Commercial Managed Care - PPO

## 2023-04-26 ENCOUNTER — Encounter: Payer: Self-pay | Admitting: Cardiology

## 2023-05-06 ENCOUNTER — Ambulatory Visit: Payer: Commercial Managed Care - PPO | Attending: Cardiology

## 2023-05-06 DIAGNOSIS — Z79899 Other long term (current) drug therapy: Secondary | ICD-10-CM

## 2023-05-06 DIAGNOSIS — E782 Mixed hyperlipidemia: Secondary | ICD-10-CM

## 2023-05-06 DIAGNOSIS — E785 Hyperlipidemia, unspecified: Secondary | ICD-10-CM

## 2023-05-17 ENCOUNTER — Ambulatory Visit (HOSPITAL_BASED_OUTPATIENT_CLINIC_OR_DEPARTMENT_OTHER): Payer: Commercial Managed Care - PPO | Admitting: Family

## 2023-05-25 ENCOUNTER — Ambulatory Visit (HOSPITAL_BASED_OUTPATIENT_CLINIC_OR_DEPARTMENT_OTHER): Payer: Commercial Managed Care - PPO | Admitting: Family

## 2023-06-29 ENCOUNTER — Encounter (HOSPITAL_BASED_OUTPATIENT_CLINIC_OR_DEPARTMENT_OTHER): Payer: Self-pay | Admitting: Family

## 2023-06-29 ENCOUNTER — Ambulatory Visit (HOSPITAL_BASED_OUTPATIENT_CLINIC_OR_DEPARTMENT_OTHER): Payer: Commercial Managed Care - PPO | Admitting: Family

## 2023-06-29 VITALS — BP 128/66 | HR 81 | Ht 69.0 in | Wt 319.6 lb

## 2023-06-29 DIAGNOSIS — Z79899 Other long term (current) drug therapy: Secondary | ICD-10-CM

## 2023-06-29 DIAGNOSIS — E785 Hyperlipidemia, unspecified: Secondary | ICD-10-CM

## 2023-06-29 DIAGNOSIS — I351 Nonrheumatic aortic (valve) insufficiency: Secondary | ICD-10-CM

## 2023-06-29 DIAGNOSIS — I7121 Aneurysm of the ascending aorta, without rupture: Secondary | ICD-10-CM | POA: Diagnosis not present

## 2023-06-29 DIAGNOSIS — I4819 Other persistent atrial fibrillation: Secondary | ICD-10-CM

## 2023-06-29 DIAGNOSIS — D6859 Other primary thrombophilia: Secondary | ICD-10-CM

## 2023-06-29 DIAGNOSIS — Z6841 Body Mass Index (BMI) 40.0 and over, adult: Secondary | ICD-10-CM | POA: Diagnosis not present

## 2023-06-29 DIAGNOSIS — I5042 Chronic combined systolic (congestive) and diastolic (congestive) heart failure: Secondary | ICD-10-CM

## 2023-06-29 DIAGNOSIS — E782 Mixed hyperlipidemia: Secondary | ICD-10-CM

## 2023-06-29 MED ORDER — EZETIMIBE 10 MG PO TABS: 10.0000 mg | ORAL_TABLET | Freq: Every day | ORAL | 3 refills | Status: DC

## 2023-06-29 NOTE — Patient Instructions (Signed)
Medication Instructions:  Continue your current medications.   We are considering a GLP-1 today called Wegovy or Ozempic based on your lab work.  This is a once weekly injection that helps you lose weight and maintain weight loss.  This medication works best when combined with healthy diet and regular exercise.  GLP1 medications help you lose weight by reducing appetite and helping you feel fuller longer. Most common side effects include nausea, vomiting, diarrhea. These side effects can be limited by eating slowly and eating small meals and often improve after a few days. We will send a prescription and prior authorization based on your lab work.  *If you need a refill on your cardiac medications before your next appointment, please call your pharmacy*  Lab Work: Your physician recommends that you return for lab work today: CBC, CMP, A1c, magnesium  If you have labs (blood work) drawn today and your tests are completely normal, you will receive your results only by: MyChart Message (if you have MyChart) OR A paper copy in the mail If you have any lab test that is abnormal or we need to change your treatment, we will call you to review the results.  Testing/Procedures: Proceed with echocardiogram as scheduled in echocardiogram.  Follow-Up: At [facility], you and your health needs are our priority.  As part of our continuing mission to provide you with exceptional heart care, we have created designated Provider Care Teams.  These Care Teams include your primary Cardiologist (physician) and Advanced Practice Providers (APPs -  Physician Assistants and Nurse Practitioners) who all work together to provide you with the care you need, when you need it.  We recommend signing up for the patient portal called "MyChart".  Sign up information is provided on this After Visit Summary.  MyChart is used to connect with patients for Virtual Visits (Telemedicine).  Patients are able to view lab/test  results, encounter notes, upcoming appointments, etc.  Non-urgent messages can be sent to your provider as well.   To learn more about what you can do with MyChart, go to [email or web address].    Your next appointment:   6 month(s)  Provider:   Jodelle Red, MD    Other Instructions  Heart Healthy Diet Recommendations: A low-salt diet is recommended. Meats should be grilled, baked, or boiled. Avoid fried foods. Focus on lean protein sources like fish or chicken with vegetables and fruits. The American Heart Association is a Chief Technology Officer!  American Heart Association Diet and Lifeystyle Recommendations   Exercise recommendations: The American Heart Association recommends 150 minutes of moderate intensity exercise weekly. Try 30 minutes of moderate intensity exercise 4-5 times per week. This could include walking, jogging, or swimming.

## 2023-06-29 NOTE — Progress Notes (Unsigned)
Cardiology Office Note:  .   Date:  06/29/2023  ID:  [name], DOB Aug 11, 1966, MRN [id number] PCP: Agapito Games, MD  [facility] Providers Cardiologist:  Jodelle Red, MD Electrophysiologist:  Lewayne Bunting, MD { Click to update primary MD,subspecialty MD or APP then REFRESH:1}   History of Present Illness: .   Joe Morrison is a 57 y.o. male  with a hx of atrial fibrillation on Tikosyn, combined systolic and diastolic heart failure, hypertension, severe aortic regurgitation, morbid obesity, OSA.  He was admitted April 2022 after presenting with worsening shortness of breath volume overload.  Found of new onset atrial fibrillation with RVR as well as acute systolic and diastolic heart failure LVEF 35 to 40%.  He was diuresed with improvement underwent DCCV however failed to return to normal sinus rhythm.  EP was consulted and he was subsequently was started on Tikosyn with successful return to NSR.  TTE 04/21/2021 LVEF 45 to 50%, moderately dilated LV, mild LVH, severe LAE, moderate AI, ascending aorta 42 mm.  He had cardiac MRI 06/21/2021 showing LVEF had returned to normal with LVEF 54%, severe AI, dilation of aortic root 46 mm, dilation of ascending aorta 41 mm, moderate TR, mild MR.   Echo 02/2022 LVEF 54%, severe AI, mild LV dilation. He saw Dr. Laneta Simmers 04/22/22 recommended for continued surveillence of AI. Recommended to return to TCTS if significant changes in LVEF, internal dimensions, or development of symptoms.   Seen 07/22/22 feeling well from cardiac perspective. Echo 01/2023 normal LVEF and cotninued severe AI. Mild dilation of aortic root 44mm and ascending aorta 42mm. preferred 6 months follow up echo which is scheduled for 08/10/23.   He presents today for follow-up independently. Since Dr. Devin Going departure plans to establish with Dr. Cristal Deer. Has a beach house at [facility] where he enjoys spending time. Feel stamina has improved which he  attributes to GDMT. Rare chest discomfort that has been stable for some time and is not severe enough to cause concern. It is short lived and resolved independently. Over the last 2 months has made significant lifestyle changes working with personal trainer a few times per week, eating predominantly at home.  Does note he is working to reduce sodium in his diet. Interested in further weight loss.   Reports no shortness of breath nor dyspnea on exertion. No edema, orthopnea, PND. Reports no palpitations.    ROS: Please see the history of present illness.    All other systems reviewed and are negative.   Studies Reviewed: [name]   EKG Interpretation Date/Time:  Tuesday June 29 2023 11:11:11 EDT Ventricular Rate:  81 PR Interval:  212 QRS Duration:  82 QT Interval:  384 QTC Calculation: 446 R Axis:   42  Text Interpretation: Sinus rhythm with 1st degree A-V block Low voltage QRS Confirmed by Gillian Shields ([id number]) on 06/29/2023 11:18:38 AM    Cardiac Studies & Procedures       ECHOCARDIOGRAM  ECHOCARDIOGRAM COMPLETE 02/08/2023  Narrative ECHOCARDIOGRAM REPORT    Patient Name:   Joe Morrison Date of Exam: 02/05/2023 Medical Rec #:  [id number]        Height:       69.0 in Accession #:    [id number]       Weight:       312.0 lb Date of Birth:  [birth year]        BSA:          2.496 m Patient Age:  Cardiology Office Note:  .   Date:  06/29/2023  ID:  [name], DOB Aug 11, 1966, MRN [id number] PCP: Agapito Games, MD  [facility] Providers Cardiologist:  Jodelle Red, MD Electrophysiologist:  Lewayne Bunting, MD { Click to update primary MD,subspecialty MD or APP then REFRESH:1}   History of Present Illness: .   Joe Morrison is a 57 y.o. male  with a hx of atrial fibrillation on Tikosyn, combined systolic and diastolic heart failure, hypertension, severe aortic regurgitation, morbid obesity, OSA.  He was admitted April 2022 after presenting with worsening shortness of breath volume overload.  Found of new onset atrial fibrillation with RVR as well as acute systolic and diastolic heart failure LVEF 35 to 40%.  He was diuresed with improvement underwent DCCV however failed to return to normal sinus rhythm.  EP was consulted and he was subsequently was started on Tikosyn with successful return to NSR.  TTE 04/21/2021 LVEF 45 to 50%, moderately dilated LV, mild LVH, severe LAE, moderate AI, ascending aorta 42 mm.  He had cardiac MRI 06/21/2021 showing LVEF had returned to normal with LVEF 54%, severe AI, dilation of aortic root 46 mm, dilation of ascending aorta 41 mm, moderate TR, mild MR.   Echo 02/2022 LVEF 54%, severe AI, mild LV dilation. He saw Dr. Laneta Simmers 04/22/22 recommended for continued surveillence of AI. Recommended to return to TCTS if significant changes in LVEF, internal dimensions, or development of symptoms.   Seen 07/22/22 feeling well from cardiac perspective. Echo 01/2023 normal LVEF and cotninued severe AI. Mild dilation of aortic root 44mm and ascending aorta 42mm. preferred 6 months follow up echo which is scheduled for 08/10/23.   He presents today for follow-up independently. Since Dr. Devin Going departure plans to establish with Dr. Cristal Deer. Has a beach house at [facility] where he enjoys spending time. Feel stamina has improved which he  attributes to GDMT. Rare chest discomfort that has been stable for some time and is not severe enough to cause concern. It is short lived and resolved independently. Over the last 2 months has made significant lifestyle changes working with personal trainer a few times per week, eating predominantly at home.  Does note he is working to reduce sodium in his diet. Interested in further weight loss.   Reports no shortness of breath nor dyspnea on exertion. No edema, orthopnea, PND. Reports no palpitations.    ROS: Please see the history of present illness.    All other systems reviewed and are negative.   Studies Reviewed: [name]   EKG Interpretation Date/Time:  Tuesday June 29 2023 11:11:11 EDT Ventricular Rate:  81 PR Interval:  212 QRS Duration:  82 QT Interval:  384 QTC Calculation: 446 R Axis:   42  Text Interpretation: Sinus rhythm with 1st degree A-V block Low voltage QRS Confirmed by Gillian Shields ([id number]) on 06/29/2023 11:18:38 AM    Cardiac Studies & Procedures       ECHOCARDIOGRAM  ECHOCARDIOGRAM COMPLETE 02/08/2023  Narrative ECHOCARDIOGRAM REPORT    Patient Name:   Joe Morrison Date of Exam: 02/05/2023 Medical Rec #:  [id number]        Height:       69.0 in Accession #:    [id number]       Weight:       312.0 lb Date of Birth:  [birth year]        BSA:          2.496 m Patient Age:  cm/s LV PW:         1.20 cm   LV E/e' medial:  8.3 LV IVS:        1.20 cm   LV e' lateral:   5.44 cm/s LVOT diam:     2.80 cm   LV E/e' lateral: 12.4 LV SV:         162 LV SV Index:   65 LVOT Area:     6.16 cm   IVC IVC diam: 1.60 cm  LEFT ATRIUM              Index LA diam:        4.50 cm  1.80 cm/m LA Vol (A2C):   107.0 ml 42.87 ml/m LA Vol (A4C):   85.6 ml  34.30 ml/m LA Biplane Vol: 96.9 ml  38.83 ml/m AORTIC VALVE LVOT Vmax:   112.00 cm/s LVOT Vmean:  76.600 cm/s LVOT VTI:    0.262 m AI PHT:      271 msec  AORTA Ao Root diam: 4.40 cm Ao Asc diam:  4.30 cm  MITRAL VALVE               TRICUSPID VALVE MV Area (PHT): 2.95 cm    TR Peak grad:   21.3 mmHg MV Decel Time: 257 msec    TR Vmax:        231.00 cm/s MV E velocity: 67.50 cm/s MV A velocity: 94.40 cm/s  SHUNTS MV E/A ratio:  0.72        Systemic VTI:  0.26 m Systemic Diam: 2.80 cm  Olga Millers MD Electronically  signed by Olga Millers MD Signature Date/Time: 02/05/2023/9:45:10 AM    Final   TEE  ECHO TEE 01/29/2021  Narrative TRANSESOPHOGEAL ECHO REPORT    Patient Name:   Joe Morrison Date of Exam: 01/29/2021 Medical Rec #:  [id number]        Height: Accession #:    [id number]       Weight: Date of Birth:  Apr 17, 1966        BSA: Patient Age:    54 years         BP:           147/77 mmHg Patient Gender: M                HR:           105 bpm. Exam Location:  Inpatient  Procedure: Transesophageal Echo, 3D Echo and Color Doppler  Indications:     I48.91* Unspeicified atrial fibrillation  History:         Patient has prior history of Echocardiogram examinations, most recent 01/28/2021. CHF, Arrythmias:Atrial Fibrillation; Risk Factors:Hypertension and ETOH.  Sonographer:     Eulah Pont RDCS Referring Phys:  [id number] Manson Passey Diagnosing Phys: Charlton Haws MD  PROCEDURE: After discussion of the risks and benefits of a TEE, an informed consent was obtained from the patient. The transesophogeal probe was passed without difficulty through the esophogus of the patient. Local oropharyngeal anesthetic was provided with Cetacaine. Sedation performed by different physician. The patient was monitored while under deep sedation. Anesthestetic sedation was provided intravenously by Anesthesiology: 117.6mg  of Propofol, 60mg  of Lidocaine. The patient's vital signs; including heart rate, blood pressure, and oxygen saturation; remained stable throughout the procedure. The patient developed no complications during the procedure. An unsuccessful direct current cardioversion was performed at 200 joules with 4.  IMPRESSIONS   1. DCC attempted x 4 with 200J biphasic  Cardiology Office Note:  .   Date:  06/29/2023  ID:  [name], DOB Aug 11, 1966, MRN [id number] PCP: Agapito Games, MD  [facility] Providers Cardiologist:  Jodelle Red, MD Electrophysiologist:  Lewayne Bunting, MD { Click to update primary MD,subspecialty MD or APP then REFRESH:1}   History of Present Illness: .   Joe Morrison is a 57 y.o. male  with a hx of atrial fibrillation on Tikosyn, combined systolic and diastolic heart failure, hypertension, severe aortic regurgitation, morbid obesity, OSA.  He was admitted April 2022 after presenting with worsening shortness of breath volume overload.  Found of new onset atrial fibrillation with RVR as well as acute systolic and diastolic heart failure LVEF 35 to 40%.  He was diuresed with improvement underwent DCCV however failed to return to normal sinus rhythm.  EP was consulted and he was subsequently was started on Tikosyn with successful return to NSR.  TTE 04/21/2021 LVEF 45 to 50%, moderately dilated LV, mild LVH, severe LAE, moderate AI, ascending aorta 42 mm.  He had cardiac MRI 06/21/2021 showing LVEF had returned to normal with LVEF 54%, severe AI, dilation of aortic root 46 mm, dilation of ascending aorta 41 mm, moderate TR, mild MR.   Echo 02/2022 LVEF 54%, severe AI, mild LV dilation. He saw Dr. Laneta Simmers 04/22/22 recommended for continued surveillence of AI. Recommended to return to TCTS if significant changes in LVEF, internal dimensions, or development of symptoms.   Seen 07/22/22 feeling well from cardiac perspective. Echo 01/2023 normal LVEF and cotninued severe AI. Mild dilation of aortic root 44mm and ascending aorta 42mm. preferred 6 months follow up echo which is scheduled for 08/10/23.   He presents today for follow-up independently. Since Dr. Devin Going departure plans to establish with Dr. Cristal Deer. Has a beach house at [facility] where he enjoys spending time. Feel stamina has improved which he  attributes to GDMT. Rare chest discomfort that has been stable for some time and is not severe enough to cause concern. It is short lived and resolved independently. Over the last 2 months has made significant lifestyle changes working with personal trainer a few times per week, eating predominantly at home.  Does note he is working to reduce sodium in his diet. Interested in further weight loss.   Reports no shortness of breath nor dyspnea on exertion. No edema, orthopnea, PND. Reports no palpitations.    ROS: Please see the history of present illness.    All other systems reviewed and are negative.   Studies Reviewed: [name]   EKG Interpretation Date/Time:  Tuesday June 29 2023 11:11:11 EDT Ventricular Rate:  81 PR Interval:  212 QRS Duration:  82 QT Interval:  384 QTC Calculation: 446 R Axis:   42  Text Interpretation: Sinus rhythm with 1st degree A-V block Low voltage QRS Confirmed by Gillian Shields ([id number]) on 06/29/2023 11:18:38 AM    Cardiac Studies & Procedures       ECHOCARDIOGRAM  ECHOCARDIOGRAM COMPLETE 02/08/2023  Narrative ECHOCARDIOGRAM REPORT    Patient Name:   Joe Morrison Date of Exam: 02/05/2023 Medical Rec #:  [id number]        Height:       69.0 in Accession #:    [id number]       Weight:       312.0 lb Date of Birth:  [birth year]        BSA:          2.496 m Patient Age:  Cardiology Office Note:  .   Date:  06/29/2023  ID:  [name], DOB Aug 11, 1966, MRN [id number] PCP: Agapito Games, MD  [facility] Providers Cardiologist:  Jodelle Red, MD Electrophysiologist:  Lewayne Bunting, MD { Click to update primary MD,subspecialty MD or APP then REFRESH:1}   History of Present Illness: .   Joe Morrison is a 57 y.o. male  with a hx of atrial fibrillation on Tikosyn, combined systolic and diastolic heart failure, hypertension, severe aortic regurgitation, morbid obesity, OSA.  He was admitted April 2022 after presenting with worsening shortness of breath volume overload.  Found of new onset atrial fibrillation with RVR as well as acute systolic and diastolic heart failure LVEF 35 to 40%.  He was diuresed with improvement underwent DCCV however failed to return to normal sinus rhythm.  EP was consulted and he was subsequently was started on Tikosyn with successful return to NSR.  TTE 04/21/2021 LVEF 45 to 50%, moderately dilated LV, mild LVH, severe LAE, moderate AI, ascending aorta 42 mm.  He had cardiac MRI 06/21/2021 showing LVEF had returned to normal with LVEF 54%, severe AI, dilation of aortic root 46 mm, dilation of ascending aorta 41 mm, moderate TR, mild MR.   Echo 02/2022 LVEF 54%, severe AI, mild LV dilation. He saw Dr. Laneta Simmers 04/22/22 recommended for continued surveillence of AI. Recommended to return to TCTS if significant changes in LVEF, internal dimensions, or development of symptoms.   Seen 07/22/22 feeling well from cardiac perspective. Echo 01/2023 normal LVEF and cotninued severe AI. Mild dilation of aortic root 44mm and ascending aorta 42mm. preferred 6 months follow up echo which is scheduled for 08/10/23.   He presents today for follow-up independently. Since Dr. Devin Going departure plans to establish with Dr. Cristal Deer. Has a beach house at [facility] where he enjoys spending time. Feel stamina has improved which he  attributes to GDMT. Rare chest discomfort that has been stable for some time and is not severe enough to cause concern. It is short lived and resolved independently. Over the last 2 months has made significant lifestyle changes working with personal trainer a few times per week, eating predominantly at home.  Does note he is working to reduce sodium in his diet. Interested in further weight loss.   Reports no shortness of breath nor dyspnea on exertion. No edema, orthopnea, PND. Reports no palpitations.    ROS: Please see the history of present illness.    All other systems reviewed and are negative.   Studies Reviewed: [name]   EKG Interpretation Date/Time:  Tuesday June 29 2023 11:11:11 EDT Ventricular Rate:  81 PR Interval:  212 QRS Duration:  82 QT Interval:  384 QTC Calculation: 446 R Axis:   42  Text Interpretation: Sinus rhythm with 1st degree A-V block Low voltage QRS Confirmed by Gillian Shields ([id number]) on 06/29/2023 11:18:38 AM    Cardiac Studies & Procedures       ECHOCARDIOGRAM  ECHOCARDIOGRAM COMPLETE 02/08/2023  Narrative ECHOCARDIOGRAM REPORT    Patient Name:   Joe Morrison Date of Exam: 02/05/2023 Medical Rec #:  [id number]        Height:       69.0 in Accession #:    [id number]       Weight:       312.0 lb Date of Birth:  [birth year]        BSA:          2.496 m Patient Age:

## 2023-06-30 ENCOUNTER — Telehealth: Payer: Self-pay | Admitting: Pharmacy Technician

## 2023-06-30 ENCOUNTER — Other Ambulatory Visit (HOSPITAL_COMMUNITY): Payer: Self-pay

## 2023-06-30 MED ORDER — SEMAGLUTIDE-WEIGHT MANAGEMENT 2.4 MG/0.75ML ~~LOC~~ SOAJ
2.4000 mg | SUBCUTANEOUS | 0 refills | Status: AC
Start: 2023-10-24 — End: 2023-11-21

## 2023-06-30 MED ORDER — SEMAGLUTIDE-WEIGHT MANAGEMENT 1.7 MG/0.75ML ~~LOC~~ SOAJ
1.7000 mg | SUBCUTANEOUS | 0 refills | Status: AC
Start: 2023-09-25 — End: 2023-10-23

## 2023-06-30 MED ORDER — SEMAGLUTIDE-WEIGHT MANAGEMENT 1 MG/0.5ML ~~LOC~~ SOAJ
1.0000 mg | SUBCUTANEOUS | 0 refills | Status: AC
Start: 2023-08-27 — End: 2023-09-24

## 2023-06-30 MED ORDER — SEMAGLUTIDE-WEIGHT MANAGEMENT 0.5 MG/0.5ML ~~LOC~~ SOAJ
0.5000 mg | SUBCUTANEOUS | 0 refills | Status: AC
Start: 2023-07-29 — End: 2023-08-26

## 2023-06-30 MED ORDER — SEMAGLUTIDE-WEIGHT MANAGEMENT 0.25 MG/0.5ML ~~LOC~~ SOAJ
0.2500 mg | SUBCUTANEOUS | 0 refills | Status: AC
Start: 2023-06-30 — End: 2023-07-28

## 2023-06-30 NOTE — Telephone Encounter (Signed)
-----   Message from Alver Sorrow sent at 06/30/2023  8:24 AM EDT ----- Hi team,  Please submit PA for [facility] for BMI 47, morbid obesity. TY!

## 2023-06-30 NOTE — Telephone Encounter (Signed)
Pharmacy Patient Advocate Encounter   Received notification from  [facility] charts  that prior authorization for wegovy is required/requested.   Insurance verification completed.   The patient is insured through [employer] .   Per test claim: PA required; PA submitted to [employer] via Prompt PA Key/confirmation #/EOC [id number] Status is pending

## 2023-06-30 NOTE — Telephone Encounter (Signed)
Pharmacy Patient Advocate Encounter  Received notification from [employer] that Prior Authorization for wegovy has been DENIED.  Full denial letter will be uploaded to the media tab. See denial reason below.   PA #/Case ID/Reference #: [id number]

## 2023-07-01 NOTE — Telephone Encounter (Signed)
Patient made aware of denial via MyChart.   Alver Sorrow, NP

## 2023-07-05 ENCOUNTER — Telehealth (HOSPITAL_BASED_OUTPATIENT_CLINIC_OR_DEPARTMENT_OTHER): Payer: Self-pay | Admitting: *Deleted

## 2023-07-05 DIAGNOSIS — R945 Abnormal results of liver function studies: Secondary | ICD-10-CM

## 2023-07-05 DIAGNOSIS — E875 Hyperkalemia: Secondary | ICD-10-CM

## 2023-07-05 NOTE — Telephone Encounter (Signed)
-----   Message from Alver Sorrow sent at 06/30/2023  8:23 AM EDT ----- CBC no anemia nor infection.  Overall stable kidney function.  Magnesium mildly elevated, not of concern.  A1c with no prediabetes nor diabetes.  Plan to start [facility] as discussed in clinic for weight loss, Rx sent.  Potassium mildly elevated.  Recommend avoiding potassium supplement.  Reduce spironolactone to 12.5 mg (half tablet) daily as this can contribute to elevated potassium.  Repeat BMP Monday or Tuesday of next week for monitoring.  One liver enzyme, AST, elevated.  Ensure reducing intake of alcohol, acetaminophen (Tylenol) and fried foods.  Recommend repeat liver enzymes in 6-8 weeks for monitoring.

## 2023-07-05 NOTE — Telephone Encounter (Signed)
Called patient since had not heard back patient viewed labs in [place] but unsure if he saw comments Reviewed recommendations including reducing Aldactone to 1/2 tablet daily, takes a multivitamin daily  Oders placed for bmet and will mail lab orders for repeat LFT's

## 2023-07-19 ENCOUNTER — Other Ambulatory Visit: Payer: Self-pay

## 2023-07-19 DIAGNOSIS — I4819 Other persistent atrial fibrillation: Secondary | ICD-10-CM

## 2023-07-19 MED ORDER — APIXABAN 5 MG PO TABS: 5.0000 mg | ORAL_TABLET | Freq: Two times a day (BID) | ORAL | 1 refills | Status: DC

## 2023-07-19 NOTE — Telephone Encounter (Signed)
Prescription refill request for Eliquis received. Indication: Afib  Last office visit: 06/29/23 Dan Humphreys)  Scr: 1.41 (06/29/23)  Age: 57 Weight: 145kg  Appropriate dose. Refill sent.

## 2023-08-10 ENCOUNTER — Ambulatory Visit (HOSPITAL_COMMUNITY): Payer: Commercial Managed Care - PPO | Attending: Cardiology

## 2023-08-10 DIAGNOSIS — I712 Thoracic aortic aneurysm, without rupture, unspecified: Secondary | ICD-10-CM | POA: Diagnosis present

## 2023-08-10 DIAGNOSIS — I77810 Thoracic aortic ectasia: Secondary | ICD-10-CM | POA: Insufficient documentation

## 2023-08-10 DIAGNOSIS — I351 Nonrheumatic aortic (valve) insufficiency: Secondary | ICD-10-CM | POA: Diagnosis present

## 2023-08-10 DIAGNOSIS — I071 Rheumatic tricuspid insufficiency: Secondary | ICD-10-CM | POA: Insufficient documentation

## 2023-08-10 LAB — ECHOCARDIOGRAM COMPLETE
Area-P 1/2: 3.31 cm2
P 1/2 time: 270 ms
S' Lateral: 2.6 cm

## 2023-08-10 MED ORDER — PERFLUTREN LIPID MICROSPHERE
1.0000 mL | INTRAVENOUS | Status: AC | PRN
Start: 2023-08-10 — End: 2023-08-10
  Administered 2023-08-10: 1 mL via INTRAVENOUS

## 2023-08-11 ENCOUNTER — Telehealth (HOSPITAL_BASED_OUTPATIENT_CLINIC_OR_DEPARTMENT_OTHER): Payer: Self-pay

## 2023-08-11 NOTE — Telephone Encounter (Signed)
-----   Message from Nurse Corky Crafts sent at 08/10/2023  6:22 PM EDT ----- Former Dr. Shari Prows pt and is now assigned to establish with Dr. Cristal Deer.   Please result and review with the pt.   Thanks Lajoyce Corners ----- Message ----- From: Interface, [employer] Sent: 08/10/2023  10:55 AM EDT To: [address] Triage

## 2023-08-11 NOTE — Telephone Encounter (Signed)
Called patients of results. He verbalized understanding.

## 2023-08-16 ENCOUNTER — Encounter (HOSPITAL_BASED_OUTPATIENT_CLINIC_OR_DEPARTMENT_OTHER): Payer: Self-pay

## 2023-08-16 NOTE — Telephone Encounter (Signed)
Thoughts?

## 2023-09-06 ENCOUNTER — Other Ambulatory Visit: Payer: Self-pay | Admitting: *Deleted

## 2023-09-06 MED ORDER — SPIRONOLACTONE 25 MG PO TABS: 12.5000 mg | ORAL_TABLET | Freq: Every day | ORAL | 0 refills | Status: DC

## 2023-09-13 ENCOUNTER — Encounter (HOSPITAL_BASED_OUTPATIENT_CLINIC_OR_DEPARTMENT_OTHER): Payer: Self-pay

## 2023-09-13 ENCOUNTER — Telehealth (HOSPITAL_BASED_OUTPATIENT_CLINIC_OR_DEPARTMENT_OTHER): Payer: Self-pay | Admitting: Pharmacy Technician

## 2023-09-13 ENCOUNTER — Other Ambulatory Visit (HOSPITAL_COMMUNITY): Payer: Self-pay

## 2023-09-13 DIAGNOSIS — I5042 Chronic combined systolic (congestive) and diastolic (congestive) heart failure: Secondary | ICD-10-CM

## 2023-09-13 DIAGNOSIS — I4819 Other persistent atrial fibrillation: Secondary | ICD-10-CM

## 2023-09-13 DIAGNOSIS — E785 Hyperlipidemia, unspecified: Secondary | ICD-10-CM

## 2023-09-13 DIAGNOSIS — Z79899 Other long term (current) drug therapy: Secondary | ICD-10-CM

## 2023-09-13 DIAGNOSIS — I10 Essential (primary) hypertension: Secondary | ICD-10-CM

## 2023-09-13 MED ORDER — TORSEMIDE 20 MG PO TABS: ORAL_TABLET | ORAL | 3 refills | Status: DC

## 2023-09-13 MED ORDER — SPIRONOLACTONE 25 MG PO TABS: 12.5000 mg | ORAL_TABLET | Freq: Every day | ORAL | 3 refills | Status: DC

## 2023-09-13 MED ORDER — ENTRESTO 97-103 MG PO TABS: 1.0000 | ORAL_TABLET | Freq: Two times a day (BID) | ORAL | 3 refills | Status: DC

## 2023-09-13 MED ORDER — APIXABAN 5 MG PO TABS
5.0000 mg | ORAL_TABLET | Freq: Two times a day (BID) | ORAL | 1 refills | Status: DC
Start: 2023-09-13 — End: 2024-04-07

## 2023-09-13 MED ORDER — ROSUVASTATIN CALCIUM 20 MG PO TABS: 20.0000 mg | ORAL_TABLET | Freq: Every day | ORAL | 3 refills | Status: DC

## 2023-09-13 MED ORDER — METOPROLOL SUCCINATE ER 100 MG PO TB24: 100.0000 mg | ORAL_TABLET | Freq: Every day | ORAL | 3 refills | Status: DC

## 2023-09-13 MED ORDER — DAPAGLIFLOZIN PROPANEDIOL 10 MG PO TABS: 10.0000 mg | ORAL_TABLET | Freq: Every day | ORAL | 3 refills | Status: DC

## 2023-09-13 MED ORDER — DOFETILIDE 500 MCG PO CAPS: ORAL_CAPSULE | ORAL | 2 refills | Status: DC

## 2023-09-13 NOTE — Telephone Encounter (Addendum)
Pharmacy Patient Advocate Encounter   Received notification from [employer] that prior authorization for dapagliflozin is required/requested.   Insurance verification completed.   The patient is insured through [employer] .   Per test claim: PA required; PA submitted to above mentioned insurance via CoverMyMeds Key/confirmation #/EOC [id number] Status is pending

## 2023-09-15 MED ORDER — DOFETILIDE 500 MCG PO CAPS: ORAL_CAPSULE | ORAL | 2 refills | Status: DC

## 2023-09-15 NOTE — Addendum Note (Signed)
Addended by: Gabriel Rung on: 09/15/2023 03:55 PM   Modules accepted: Orders

## 2023-09-17 NOTE — Telephone Encounter (Signed)
Pharmacy Patient Advocate Encounter  Received notification from [employer] that Prior Authorization for dapagliflozin has been DENIED.  See denial reason below. No denial letter attached in CMM. Will attach denial letter to Media tab once received.  BRAND covered   PA #/Case ID/Reference #: [id number]

## 2023-12-20 ENCOUNTER — Other Ambulatory Visit: Payer: Self-pay

## 2023-12-20 DIAGNOSIS — Z79899 Other long term (current) drug therapy: Secondary | ICD-10-CM

## 2023-12-20 DIAGNOSIS — I10 Essential (primary) hypertension: Secondary | ICD-10-CM

## 2023-12-20 DIAGNOSIS — I4819 Other persistent atrial fibrillation: Secondary | ICD-10-CM

## 2023-12-20 DIAGNOSIS — I5042 Chronic combined systolic (congestive) and diastolic (congestive) heart failure: Secondary | ICD-10-CM

## 2023-12-20 MED ORDER — DAPAGLIFLOZIN PROPANEDIOL 10 MG PO TABS: 10.0000 mg | ORAL_TABLET | Freq: Every day | ORAL | 1 refills | Status: DC

## 2023-12-24 ENCOUNTER — Ambulatory Visit (HOSPITAL_BASED_OUTPATIENT_CLINIC_OR_DEPARTMENT_OTHER): Payer: Commercial Managed Care - PPO | Admitting: Cardiology

## 2024-01-10 NOTE — Progress Notes (Deleted)
 Cardiology Office Note:  .   Date:  01/10/2024  ID:  Joe Morrison, DOB 1966/08/13, MRN [id number] PCP: Agapito Games, MD  [facility] Providers Cardiologist:  Jodelle Red, MD Electrophysiologist:  Lewayne Bunting, MD    History of Present Illness: .   Joe Morrison is a 58 y.o. male  with a hx of atrial fibrillation on Tikosyn, combined systolic and diastolic heart failure, hypertension, severe aortic regurgitation, morbid obesity, OSA.  He was admitted April 2022 after presenting with worsening shortness of breath volume overload.  Found of new onset atrial fibrillation with RVR as well as acute systolic and diastolic heart failure LVEF 35 to 40%.  He was diuresed with improvement underwent DCCV however failed to return to normal sinus rhythm.  EP was consulted and he was subsequently was started on Tikosyn with successful return to NSR.  TTE 04/21/2021 LVEF 45 to 50%, moderately dilated LV, mild LVH, severe LAE, moderate AI, ascending aorta 42 mm.  He had cardiac MRI 06/21/2021 showing LVEF had returned to normal with LVEF 54%, severe AI, dilation of aortic root 46 mm, dilation of ascending aorta 41 mm, moderate TR, mild MR.   Echo 02/2022 LVEF 54%, severe AI, mild LV dilation. He saw Dr. Laneta Simmers 04/22/22 recommended for continued surveillence of AI. Recommended to return to TCTS if significant changes in LVEF, internal dimensions, or development of symptoms.   Seen 07/22/22 feeling well from cardiac perspective. Echo 01/2023 normal LVEF and cotninued severe AI. Mild dilation of aortic root 44mm and ascending aorta 42mm. preferred 6 months follow up echo whic  Last seen 07/09/23. He noted only rare chest discomfort which self resolved. He was working with a Systems analyst on lifestyle changes. Reginal Lutes was prescribed, but not approved by his insurance. As potassium mildly elevated, Spironolactone reduced to 12.5mg  daily. Echo 07/2023 normal LVEF, stable mild to moderate AI,  aortic root 44mm, ascending aorta 43mm.   Presents today for follow up. ***  ROS: Please see the history of present illness.    All other systems reviewed and are negative.   Studies Reviewed: .           Risk Assessment/Calculations:    CHA2DS2-VASc Score =     This indicates a  % annual risk of stroke. The patient's score is based upon:     {This patient has a significant risk of stroke if diagnosed with atrial fibrillation.  Please consider VKA or DOAC agent for anticoagulation if the bleeding risk is acceptable.   You can also use the SmartPhrase .[email or web address] for documentation.   :[id number]} No BP recorded.  {Refresh Note OR Click here to enter BP  :1}***       Physical Exam:   VS:  There were no vitals taken for this visit.   Wt Readings from Last 3 Encounters:  06/29/23 (!) 319 lb 9.6 oz (145 kg)  07/22/22 (!) 312 lb (141.5 kg)  04/22/22 295 lb (133.8 kg)    GEN: Well nourished, overweight, well developed in no acute distress NECK: No JVD; No carotid bruits CARDIAC: RRR, no murmurs, rubs, gallops RESPIRATORY:  Clear to auscultation without rales, wheezing or rhonchi  ABDOMEN: Soft, non-tender, non-distended EXTREMITIES:  No edema; No deformity   ASSESSMENT AND PLAN: .    Combined systolic and diastolic heart failure with recovered LVEF-***  Severe AI - Per previous discussion with Dr. Laneta Simmers plan for surgical intervention only if he becomes symptomatic or LVEF decreases.***  Aorta dilation-***  HLD / Hypertriglyceridemia -***  OSA***  PAF/hypercoagulable state-***  BMI *** -        Dispo: follow up in 6 months  Signed, Alver Sorrow, NP

## 2024-01-11 ENCOUNTER — Ambulatory Visit (HOSPITAL_BASED_OUTPATIENT_CLINIC_OR_DEPARTMENT_OTHER): Payer: Commercial Managed Care - PPO | Admitting: Family

## 2024-01-11 ENCOUNTER — Encounter (HOSPITAL_BASED_OUTPATIENT_CLINIC_OR_DEPARTMENT_OTHER): Payer: Self-pay

## 2024-01-11 NOTE — Telephone Encounter (Signed)
 Rescheduled

## 2024-01-18 ENCOUNTER — Encounter (HOSPITAL_BASED_OUTPATIENT_CLINIC_OR_DEPARTMENT_OTHER): Payer: Self-pay | Admitting: Family

## 2024-01-18 ENCOUNTER — Other Ambulatory Visit (HOSPITAL_BASED_OUTPATIENT_CLINIC_OR_DEPARTMENT_OTHER): Payer: Self-pay

## 2024-01-18 ENCOUNTER — Encounter (HOSPITAL_BASED_OUTPATIENT_CLINIC_OR_DEPARTMENT_OTHER): Payer: Self-pay

## 2024-01-18 ENCOUNTER — Telehealth: Payer: Self-pay | Admitting: Pharmacy Technician

## 2024-01-18 ENCOUNTER — Ambulatory Visit (INDEPENDENT_AMBULATORY_CARE_PROVIDER_SITE_OTHER): Admitting: Family

## 2024-01-18 ENCOUNTER — Other Ambulatory Visit (HOSPITAL_COMMUNITY): Payer: Self-pay

## 2024-01-18 VITALS — BP 124/62 | HR 73 | Ht 69.0 in | Wt 313.3 lb

## 2024-01-18 DIAGNOSIS — I5042 Chronic combined systolic (congestive) and diastolic (congestive) heart failure: Secondary | ICD-10-CM | POA: Diagnosis not present

## 2024-01-18 DIAGNOSIS — I480 Paroxysmal atrial fibrillation: Secondary | ICD-10-CM | POA: Diagnosis not present

## 2024-01-18 DIAGNOSIS — G4733 Obstructive sleep apnea (adult) (pediatric): Secondary | ICD-10-CM

## 2024-01-18 DIAGNOSIS — D6859 Other primary thrombophilia: Secondary | ICD-10-CM | POA: Diagnosis not present

## 2024-01-18 DIAGNOSIS — I4819 Other persistent atrial fibrillation: Secondary | ICD-10-CM

## 2024-01-18 DIAGNOSIS — Z6841 Body Mass Index (BMI) 40.0 and over, adult: Secondary | ICD-10-CM

## 2024-01-18 DIAGNOSIS — E782 Mixed hyperlipidemia: Secondary | ICD-10-CM

## 2024-01-18 MED ORDER — ZEPBOUND 7.5 MG/0.5ML ~~LOC~~ SOAJ
7.5000 mg | SUBCUTANEOUS | 0 refills | Status: DC
  Filled 2024-01-18: qty 2, 28d supply, fill #0

## 2024-01-18 MED ORDER — ZEPBOUND 10 MG/0.5ML ~~LOC~~ SOAJ
10.0000 mg | SUBCUTANEOUS | 0 refills | Status: DC
  Filled 2024-01-18: qty 2, 28d supply, fill #0

## 2024-01-18 MED ORDER — ZEPBOUND 12.5 MG/0.5ML ~~LOC~~ SOAJ
12.5000 mg | SUBCUTANEOUS | 0 refills | Status: DC
  Filled 2024-01-18: qty 2, 28d supply, fill #0

## 2024-01-18 MED ORDER — ZEPBOUND 5 MG/0.5ML ~~LOC~~ SOAJ
5.0000 mg | SUBCUTANEOUS | 0 refills | Status: DC
  Filled 2024-01-18: qty 2, 28d supply, fill #0

## 2024-01-18 MED ORDER — ZEPBOUND 15 MG/0.5ML ~~LOC~~ SOAJ
15.0000 mg | SUBCUTANEOUS | 3 refills | Status: DC
  Filled 2024-01-18: qty 6, 84d supply, fill #0

## 2024-01-18 MED ORDER — ZEPBOUND 2.5 MG/0.5ML ~~LOC~~ SOAJ
2.5000 mg | SUBCUTANEOUS | 0 refills | Status: DC
  Filled 2024-01-18 – 2024-01-20 (×2): qty 2, 28d supply, fill #0

## 2024-01-18 NOTE — Telephone Encounter (Signed)
 Pharmacy Patient Advocate Encounter   Received notification from Physician's Office that prior authorization for Zepbound is required/requested.   Insurance verification completed.   The patient is insured through  [employer]  .   Per test claim: Medication is covered if under $1000.00  and no PA needed at this time. Patient should be able to go [email or web address]  to get the savings card to bring cost to $25 on the savings program

## 2024-01-18 NOTE — Patient Instructions (Signed)
 Medication Instructions:  Continue your current medications  *If you need a refill on your cardiac medications before your next appointment, please call your pharmacy*  Lab Work: Your physician recommends that you return for lab work in the next 1-2 weeks for fasting labs: CMET, CBC, magnesium, lipid panel   If you have labs (blood work) drawn today and your tests are completely normal, you will receive your results only by: MyChart Message (if you have MyChart) OR A paper copy in the mail If you have any lab test that is abnormal or we need to change your treatment, we will call you to review the results.   Follow-Up: At [facility], you and your health needs are our priority.  As part of our continuing mission to provide you with exceptional heart care, our providers are all part of one team.  This team includes your primary Cardiologist (physician) and Advanced Practice Providers or APPs (Physician Assistants and Nurse Practitioners) who all work together to provide you with the care you need, when you need it.  Your next appointment:   In September with Dr. Cristal Deer  We recommend signing up for the patient portal called "MyChart".  Sign up information is provided on this After Visit Summary.  MyChart is used to connect with patients for Virtual Visits (Telemedicine).  Patients are able to view lab/test results, encounter notes, upcoming appointments, etc.  Non-urgent messages can be sent to your provider as well.   To learn more about what you can do with MyChart, go to [email or web address].

## 2024-01-18 NOTE — Progress Notes (Signed)
 Cardiology Office Note:  .   Date:  01/18/2024  ID:  [name], DOB 1966/08/03, MRN [id number] PCP: Agapito Games, MD  [facility] Providers Cardiologist:  Jodelle Red, MD Electrophysiologist:  Lewayne Bunting, MD    History of Present Illness: .   Joe Morrison is a 58 y.o. male  with a hx of atrial fibrillation on Tikosyn, combined systolic and diastolic heart failure, hypertension, severe aortic regurgitation, morbid obesity, OSA.  He was admitted April 2022 after presenting with worsening shortness of breath volume overload.  Found of new onset atrial fibrillation with RVR as well as acute systolic and diastolic heart failure LVEF 35 to 40%.  He was diuresed with improvement underwent DCCV however failed to return to normal sinus rhythm.  EP was consulted and he was subsequently was started on Tikosyn with successful return to NSR.  TTE 04/21/2021 LVEF 45 to 50%, moderately dilated LV, mild LVH, severe LAE, moderate AI, ascending aorta 42 mm.  He had cardiac MRI 06/21/2021 showing LVEF had returned to normal with LVEF 54%, severe AI, dilation of aortic root 46 mm, dilation of ascending aorta 41 mm, moderate TR, mild MR.   Echo 02/2022 LVEF 54%, severe AI, mild LV dilation. He saw Dr. Laneta Simmers 04/22/22 recommended for continued surveillence of AI. Recommended to return to TCTS if significant changes in LVEF, internal dimensions, or development of symptoms.   Seen 07/22/22 feeling well from cardiac perspective. Echo 01/2023 normal LVEF and cotninued severe AI. Mild dilation of aortic root 44mm and ascending aorta 42mm. Echo 08/10/23 normal LVEF 60-65%, mild LVH, RV normal, mild to moderate LAE, moderate to severe AI without stenosis, near holodiastolic flow reversal in descending aorta, mild dilation aortic root 44mm, mild dilation ascending aorta 43mm.   Last seen 06/29/23 doing well from cardiac perspective. Unfortunately insurance did not cover [phone]. He was working on  lifestyle changes and exercising with personal trainer a few times per week.   History of Present Illness Presents today for follow up independently. Recently returned from a trip to [place] with his partner. Weight loss of 6 lbs over the last 6 months through lifestyle changes. He reports occasional minor chest pain that comes and goes without a specific pattern. The pain is described as a minor sting located in the chest area. Unchanged from previous. Despite these occasional episodes, the patient reports an overall improvement in cardiovascular health and breathing capacity. This improvement is attributed to lifestyle changes, including regular exercise with a personal trainer and dietary modifications. The patient also mentions occasional soreness in the left hip when sleeping, which he plans to address with his primary care physician.   ROS: Please see the history of present illness.    All other systems reviewed and are negative.   Studies Reviewed: [name]   EKG Interpretation Date/Time:  Tuesday January 18 2024 09:02:04 EDT Ventricular Rate:  73 PR Interval:  216 QRS Duration:  98 QT Interval:  422 QTC Calculation: 464 R Axis:   35  Text Interpretation: Sinus rhythm with 1st degree A-V block with Premature atrial complexes Low voltage QRS Confirmed by Gillian Shields ([id number]) on 01/18/2024 9:04:06 AM       Risk Assessment/Calculations:     CHA2DS2-VASc Score = 2   This indicates a 2.2% annual risk of stroke. The patient's score is based upon: CHF History: 1 HTN History: 1 Diabetes History: 0 Stroke History: 0 Vascular Disease History: 0 Age Score: 0 Gender Score: 0  Physical Exam:   VS:  BP 124/62   Pulse 73   Ht 5\' 9"  (1.753 m)   Wt (!) 313 lb 4.8 oz (142.1 kg)   SpO2 96%   BMI 46.27 kg/m    Wt Readings from Last 3 Encounters:  01/18/24 (!) 313 lb 4.8 oz (142.1 kg)  06/29/23 (!) 319 lb 9.6 oz (145 kg)  07/22/22 (!) 312 lb (141.5 kg)    GEN: Well  nourished, overweight, well developed in no acute distress NECK: No JVD; No carotid bruits CARDIAC: RRR, no murmurs, rubs, gallops RESPIRATORY:  Clear to auscultation without rales, wheezing or rhonchi  ABDOMEN: Soft, non-tender, non-distended EXTREMITIES:  No edema; No deformity   ASSESSMENT AND PLAN: .    Combined systolic and diastolic heart failure with recovered LVEF-euvolemic on exam.  NYHA I.  GDMT includes Farxiga 10 mg daily, Toprol 100 mg daily, Entresto 97-23 mg twice daily, spironolactone 25 mg daily, torsemide as needed (about 3x per week). Low sodium diet, fluid restriction <2L, and daily weights encouraged. Educated to contact our office for weight gain of 2 lbs overnight or 5 lbs in one week.    Severe AI -presently asymptomatic with normal LVEF, no significant dyspnea.  Stable by echo 07/2023.  Per previous discussion with Dr. Laneta Simmers plan for surgical intervention only if he becomes symptomatic or LVEF decreases.   Aorta dilation-echo 02/2022  aortic root 47mm, ascending aorta 38mm ?  01/2023 aortic root 45 mm, ascending aorta 42 mm ? 07/2023 aortic root 44mm, ascending aorta 43mm. Continue optimal BP control.  Consider repeat echo 07/2024, discuss at follow up.  Continue Toprol and optimal BP control..  Recommend avoidance of fluoroquinolones.   HLD / Hypertriglyceridemia -continue Zetia 10 mg daily, Vascepa 1 g twice daily, rosuvastatin 20 mg daily. Update lipid panel. If triglycerides remain elevated, plan to increase Vascepa to 2g BID.    OSA -endorses compliance with CPAP. Will initiate prior authorization for Zepbound due to OSA. Sleep study 04/2021 with severe OSA with AHI 34.1/h.   PAF/hypercoagulable state-maintaining sinus rhythm.  Continue present medical therapy Tikosyn 500 mcg BID, Toprol 100mg  daily. CHA2DS2-VASc Score = 2 [CHF History: 1, HTN History: 1, Diabetes History: 0, Stroke History: 0, Vascular Disease History: 0, Age Score: 0, Gender Score: 0].  Therefore, the  patient's annual risk of stroke is 2.2 %.    Continue Eliquis 5 mg twice daily.  Denies bleeding complications.  Update BMP, magnesium for Tikosyn monitoring.  Update CBC for Eliquis monitoring.   BMI 47-has been working with Systems analyst and making dietary changes. Congratulated on continued weight loss and increased conditioning.      Dispo: follow up in September 2025 with Dr. Cristal Deer to establish care  Signed, Alver Sorrow, NP

## 2024-01-19 ENCOUNTER — Other Ambulatory Visit (HOSPITAL_BASED_OUTPATIENT_CLINIC_OR_DEPARTMENT_OTHER): Payer: Self-pay

## 2024-01-20 ENCOUNTER — Other Ambulatory Visit (HOSPITAL_COMMUNITY): Payer: Self-pay

## 2024-01-20 ENCOUNTER — Telehealth: Payer: Self-pay | Admitting: Pharmacy Technician

## 2024-01-20 ENCOUNTER — Other Ambulatory Visit (HOSPITAL_BASED_OUTPATIENT_CLINIC_OR_DEPARTMENT_OTHER): Payer: Self-pay

## 2024-01-20 NOTE — Telephone Encounter (Signed)
 After talking to 4 different reps from the insurance. They sent me to [employer] and they said this does need a prior authorization. Submitted online.

## 2024-01-20 NOTE — Telephone Encounter (Signed)
    The pharmacy is getting a cost exceeds maximum rejection. I called the insurance and they said someone from the dispensing pharmacy hast to call and initiate a cost exceeds maximum override. I am sending to team for someone from pharmacy to call.

## 2024-01-20 NOTE — Telephone Encounter (Signed)
 The pharmacy is getting a cost exceeds maximum rejection. I called the insurance and they said someone from the dispensing pharmacy hast to call and initiate a cost exceeds maximum override. I am sending to team for someone from pharmacy to call.

## 2024-01-21 ENCOUNTER — Other Ambulatory Visit (HOSPITAL_BASED_OUTPATIENT_CLINIC_OR_DEPARTMENT_OTHER): Payer: Self-pay

## 2024-01-21 ENCOUNTER — Other Ambulatory Visit (HOSPITAL_COMMUNITY): Payer: Self-pay

## 2024-01-21 NOTE — Telephone Encounter (Signed)
 The pharmacy tried to call as well and they were told they could not do a cost exceeds override.          Pharmacy Patient Advocate Encounter   Received notification from [employer] that Prior Authorization for zepbound has been DENIED.  Full denial letter will be uploaded to the media tab. See denial reason below.

## 2024-01-21 NOTE — Telephone Encounter (Signed)
 After talking to 4 different reps from the insurance. They sent me to [employer] and they said this does need a prior authorization. Submitted online. The pharmacy tried to call as well and they were told they could not do a cost exceeds override.        Pharmacy Patient Advocate Encounter  Received notification from [employer] that Prior Authorization for zepbound has been DENIED.  Full denial letter will be uploaded to the media tab. See denial reason below.

## 2024-01-21 NOTE — Telephone Encounter (Signed)
 Spoke to patient to explain all the insurance and pharmacy told us. Told the patient about the option of being self pay and getting a coupon that would bring it to 349.00 but it would have to be for the vial. He did not want to do that. Joe Morrison gives same reject of being cost exceeds maximum. Ozempic will pay but patient is not diabetic.

## 2024-01-24 ENCOUNTER — Other Ambulatory Visit (HOSPITAL_BASED_OUTPATIENT_CLINIC_OR_DEPARTMENT_OTHER): Payer: Self-pay

## 2024-01-25 ENCOUNTER — Other Ambulatory Visit (HOSPITAL_COMMUNITY): Payer: Self-pay

## 2024-01-25 ENCOUNTER — Other Ambulatory Visit (HOSPITAL_BASED_OUTPATIENT_CLINIC_OR_DEPARTMENT_OTHER): Payer: Self-pay

## 2024-01-26 ENCOUNTER — Other Ambulatory Visit (HOSPITAL_BASED_OUTPATIENT_CLINIC_OR_DEPARTMENT_OTHER): Payer: Self-pay

## 2024-01-27 ENCOUNTER — Other Ambulatory Visit (HOSPITAL_BASED_OUTPATIENT_CLINIC_OR_DEPARTMENT_OTHER): Payer: Self-pay

## 2024-02-04 ENCOUNTER — Encounter: Payer: Self-pay | Admitting: Family Medicine

## 2024-02-04 ENCOUNTER — Ambulatory Visit (INDEPENDENT_AMBULATORY_CARE_PROVIDER_SITE_OTHER): Admitting: Family Medicine

## 2024-02-04 VITALS — BP 125/49 | HR 68 | Ht 69.0 in | Wt 309.0 lb

## 2024-02-04 DIAGNOSIS — I5041 Acute combined systolic (congestive) and diastolic (congestive) heart failure: Secondary | ICD-10-CM

## 2024-02-04 DIAGNOSIS — M25552 Pain in left hip: Secondary | ICD-10-CM | POA: Diagnosis not present

## 2024-02-04 NOTE — Assessment & Plan Note (Signed)
 With cardiology pretty closely though they may actually be extending his visits.  Well-controlled on Entresto , spironolactone , furosemide  as needed, metoprolol , and Farxiga .

## 2024-02-04 NOTE — Progress Notes (Signed)
 Acute Office Visit  Subjective:     Patient ID: Joe Morrison, male    DOB: 16-Apr-1966, 58 y.o.   MRN: [id number]  Chief Complaint  Patient presents with   Hip Pain    L hip pain pt reports that this started about 2 months ago when he started working out with his trainer. He stated that the pain is minimal and he can use ice to help alleviate his sxs. He stated that he would like to have this checked     HPI Patient is in today for left hip pain.   L hip pain pt reports that this started about 2 months ago when he started working out with his trainer. He stated that the pain is minimal and he can use ice to help alleviate his sxs. He stated that he would like to have this checked  He says most of the time the pain is an achy feeling but sometimes it can be more sharp.  It bothers him the most in the evenings especially if he lays down on that hip sometimes if he sleeps on that side then the next day it is a little bit more bothersome.  No known injury or trauma per se.  No prior imaging on file.  He has been working with cardiology pretty closely and they have have discussed him going a little longer between visits recently.  ROS      Objective:    BP (!) 125/49   Pulse 68   Ht 5\' 9"  (1.753 m)   Wt (!) 309 lb (140.2 kg)   SpO2 99%   BMI 45.63 kg/m    Physical Exam Vitals reviewed.  Constitutional:      Appearance: Normal appearance.  HENT:     Head: Normocephalic.  Pulmonary:     Effort: Pulmonary effort is normal.  Musculoskeletal:     Comments: Hip with normal flexion extension and abduction.  Strength is 5 out of 5 at the hip.  Nontender over the greater trochanter.  But that is the area where he has most of the discomfort.  Nontender over the lumbar spine.  Neurological:     Mental Status: He is alert and oriented to person, place, and time.  Psychiatric:        Mood and Affect: Mood normal.        Behavior: Behavior normal.     No results found for  any visits on 02/04/24.      Assessment & Plan:   Problem List Items Addressed This Visit       Cardiovascular and Mediastinum   Acute combined systolic and diastolic heart failure [facility])   With cardiology pretty closely though they may actually be extending his visits.  Well-controlled on Entresto , spironolactone , furosemide  as needed, metoprolol , and Farxiga .      Other Visit Diagnoses       Left hip pain    -  Primary      Left hip pain-most consistent with trochanteric bursitis.  Discussed diagnosis handout provided with exercises and stretches to do on his own at home.  Ice as needed.  Okay to take an anti-inflammatory as needed as well.  Did encourage him to speak with his trainers that they can modify his workout if needed but I do not want him to stop working out.  If not improving over the next 6 weeks then please let us  know we will get him in with Dr. Sandy Crumb for further evaluation.  Encouraged him to schedule physical this summer and we can try to get some of his vaccines up-to-date at that time.  I let him know he is due for a pneumonia vaccine now that they updated the guidelines.  No orders of the defined types were placed in this encounter.   No follow-ups on file.  Duaine German, MD

## 2024-03-22 ENCOUNTER — Encounter (HOSPITAL_BASED_OUTPATIENT_CLINIC_OR_DEPARTMENT_OTHER): Payer: Self-pay

## 2024-04-07 ENCOUNTER — Other Ambulatory Visit (HOSPITAL_BASED_OUTPATIENT_CLINIC_OR_DEPARTMENT_OTHER): Payer: Self-pay | Admitting: Family

## 2024-04-07 DIAGNOSIS — I4819 Other persistent atrial fibrillation: Secondary | ICD-10-CM

## 2024-04-07 NOTE — Telephone Encounter (Signed)
 Prescription refill request for Eliquis  received. Indication:afib Last office visit:4/25 Scr:1.41  9/24 Age: 58 Weight:140.2  kg  Prescription refilled

## 2024-05-03 ENCOUNTER — Other Ambulatory Visit (HOSPITAL_BASED_OUTPATIENT_CLINIC_OR_DEPARTMENT_OTHER): Payer: Self-pay | Admitting: Family

## 2024-05-03 DIAGNOSIS — I5042 Chronic combined systolic (congestive) and diastolic (congestive) heart failure: Secondary | ICD-10-CM

## 2024-05-24 ENCOUNTER — Ambulatory Visit (INDEPENDENT_AMBULATORY_CARE_PROVIDER_SITE_OTHER): Admitting: Family Medicine

## 2024-05-24 ENCOUNTER — Encounter: Payer: Self-pay | Admitting: Family Medicine

## 2024-05-24 VITALS — BP 114/54 | HR 73 | Ht 69.0 in | Wt 297.1 lb

## 2024-05-24 DIAGNOSIS — Z0000 Encounter for general adult medical examination without abnormal findings: Secondary | ICD-10-CM | POA: Diagnosis not present

## 2024-05-24 NOTE — Progress Notes (Signed)
 Pt reports that he will get his labs done with his Cardiologist. He has an appointment with her on 9/17.  He declined Tdap today. (No PNE 20 available)

## 2024-05-24 NOTE — Patient Instructions (Signed)
 Due for Prevnar 20 (pneumonia vaccine) Tdap (tetanus) Flu shot in October.

## 2024-05-24 NOTE — Progress Notes (Signed)
 Complete physical exam  Patient: Joe Morrison   DOB: Mar 22, 1966   58 y.o. Male  MRN: [id number]  Subjective:    Chief Complaint  Patient presents with   Annual Exam    Joe Morrison is a 58 y.o. male who presents today for a complete physical exam. He reports consuming a general diet. Exerciese 2-3 days per week for about 45 min. Has lost 12 lbs.   He generally feels well. He reports sleeping fairly well. He does not have additional problems to discuss today. BPs have been running lower. Sees Cardiology in about 4 weeks. Getting labs with them next week.    Most recent fall risk assessment:    05/24/2024    9:28 AM  Fall Risk   Falls in the past year? 0  Number falls in past yr: 0  Injury with Fall? 0  Risk for fall due to : No Fall Risks  Follow up Falls evaluation completed     Most recent depression screenings:    05/30/2021   11:22 AM 03/05/2021    9:30 AM  PHQ 2/9 Scores  PHQ - 2 Score 0 1  PHQ- 9 Score  3         Patient Care Team: Alvan Dorothyann BIRCH, MD as PCP - General Waddell Danelle ORN, MD as PCP - Electrophysiology (Cardiology) Lonni Slain, MD as PCP - Cardiology (Cardiology)   Outpatient Medications Prior to Visit  Medication Sig   AMBULATORY NON FORMULARY MEDICATION Medication Name: CPAP with tubing and mask and humidifier set to AutoPap between 4 and 16 cm of water pressure.  Please fax download after 10 days, to [phone]   dapagliflozin  propanediol (FARXIGA ) 10 MG TABS tablet Take 1 tablet (10 mg total) by mouth daily before breakfast.   dofetilide  (TIKOSYN ) 500 MCG capsule TAKE 1 CAPSULE(500 MCG) BY MOUTH TWICE DAILY   ELIQUIS  5 MG TABS tablet TAKE 1 TABLET(5 MG) BY MOUTH TWICE DAILY   ezetimibe  (ZETIA ) 10 MG tablet Take 1 tablet (10 mg total) by mouth daily.   icosapent  Ethyl (VASCEPA ) 1 g capsule Take 2 capsules (2 g total) by mouth 2 (two) times daily.   metoprolol  succinate (TOPROL -XL) 100 MG 24 hr tablet Take 1 tablet (100  mg total) by mouth daily. Take with or immediately following a meal.   Multiple Vitamin (MULTIVITAMIN WITH MINERALS) TABS tablet Take 1 tablet by mouth daily.   Multiple Vitamins-Minerals (AIRBORNE PO) Take 1 packet by mouth daily.   rosuvastatin  (CRESTOR ) 20 MG tablet Take 1 tablet (20 mg total) by mouth daily.   sacubitril -valsartan  (ENTRESTO ) 97-103 MG Take 1 tablet by mouth 2 (two) times daily.   spironolactone  (ALDACTONE ) 25 MG tablet Take 0.5 tablets (12.5 mg total) by mouth daily.   torsemide  (DEMADEX ) 20 MG tablet TAKE 1 TO 2 TABLETS(20 TO 40 MG) BY MOUTH DAILY AS NEEDED   No facility-administered medications prior to visit.    ROS        Objective:     BP (!) 114/54   Pulse 73   Ht 5' 9 (1.753 m)   Wt 297 lb 1.3 oz (134.8 kg)   SpO2 95%   BMI 43.87 kg/m     Physical Exam Vitals and nursing note reviewed.  Constitutional:      Appearance: Normal appearance.  HENT:     Head: Normocephalic and atraumatic.     Right Ear: Tympanic membrane, ear canal and external ear normal.     Left  Ear: Tympanic membrane, ear canal and external ear normal.     Nose: Nose normal.     Mouth/Throat:     Pharynx: Oropharynx is clear.  Eyes:     Extraocular Movements: Extraocular movements intact.     Conjunctiva/sclera: Conjunctivae normal.     Pupils: Pupils are equal, round, and reactive to light.  Neck:     Thyroid: No thyromegaly.  Cardiovascular:     Rate and Rhythm: Normal rate and regular rhythm.  Pulmonary:     Effort: Pulmonary effort is normal.     Breath sounds: Normal breath sounds.  Abdominal:     General: Bowel sounds are normal.     Palpations: Abdomen is soft.     Tenderness: There is no abdominal tenderness.  Musculoskeletal:        General: No swelling.     Cervical back: Neck supple.  Skin:    General: Skin is warm and dry.  Neurological:     Mental Status: He is alert and oriented to person, place, and time.  Psychiatric:        Mood and Affect:  Mood normal.        Behavior: Behavior normal.      No results found for any visits on 05/24/24.      Assessment & Plan:    Routine Health Maintenance and Physical Exam  Immunization History  Administered Date(s) Administered   Influenza Split 07/12/2012   Influenza, Seasonal, Injecte, Preservative Fre 07/23/2013   Influenza,inj,Quad PF,6+ Mos 06/23/2019, 06/21/2020, 06/21/2020   Influenza-Unspecified 07/06/2014   Moderna Sars-Covid-2 Vaccination 12/14/2019, 01/11/2020   Td 10/14/1998   Tdap 10/13/2003, 10/12/2013    Health Maintenance  Topic Date Due   Hepatitis C Screening  Never done   Pneumococcal Vaccine: 19-49 Years (1 of 2 - PCV) Never done   Pneumococcal Vaccine: 50+ Years (1 of 2 - PCV) Never done   Hepatitis B Vaccines (1 of 3 - 19+ 3-dose series) Never done   Zoster Vaccines- Shingrix (1 of 2) Never done   Colonoscopy  Never done   COVID-19 Vaccine (3 - Moderna risk series) 02/08/2020   DTaP/Tdap/Td (4 - Td or Tdap) 10/13/2023   INFLUENZA VACCINE  05/12/2024   HIV Screening  Completed   HPV VACCINES  Aged Out   Meningococcal B Vaccine  Aged Out    Discussed health benefits of physical activity, and encouraged him to engage in regular exercise appropriate for his age and condition.  Problem List Items Addressed This Visit   None Visit Diagnoses       Routine general medical examination at a health care facility    -  Primary       Keep up a regular exercise program and make sure you are eating a healthy diet Try to eat 4 servings of dairy a day, or if you are lactose intolerant take a calcium  with vitamin D daily.  Your vaccines are up to date.   No follow-ups on file.     Dorothyann Byars, MD

## 2024-06-09 ENCOUNTER — Encounter (HOSPITAL_BASED_OUTPATIENT_CLINIC_OR_DEPARTMENT_OTHER): Payer: Self-pay

## 2024-06-09 ENCOUNTER — Other Ambulatory Visit (HOSPITAL_BASED_OUTPATIENT_CLINIC_OR_DEPARTMENT_OTHER): Payer: Self-pay | Admitting: Family

## 2024-06-09 MED ORDER — DOFETILIDE 500 MCG PO CAPS: ORAL_CAPSULE | ORAL | 0 refills | Status: DC

## 2024-06-28 ENCOUNTER — Ambulatory Visit (HOSPITAL_BASED_OUTPATIENT_CLINIC_OR_DEPARTMENT_OTHER): Admitting: Cardiology

## 2024-07-08 ENCOUNTER — Other Ambulatory Visit (HOSPITAL_BASED_OUTPATIENT_CLINIC_OR_DEPARTMENT_OTHER): Payer: Self-pay | Admitting: Family

## 2024-07-08 DIAGNOSIS — E782 Mixed hyperlipidemia: Secondary | ICD-10-CM

## 2024-07-08 DIAGNOSIS — Z79899 Other long term (current) drug therapy: Secondary | ICD-10-CM

## 2024-07-08 DIAGNOSIS — E785 Hyperlipidemia, unspecified: Secondary | ICD-10-CM

## 2024-07-10 ENCOUNTER — Encounter (HOSPITAL_BASED_OUTPATIENT_CLINIC_OR_DEPARTMENT_OTHER): Payer: Self-pay

## 2024-07-11 ENCOUNTER — Encounter (HOSPITAL_BASED_OUTPATIENT_CLINIC_OR_DEPARTMENT_OTHER): Payer: Self-pay

## 2024-07-27 ENCOUNTER — Ambulatory Visit (INDEPENDENT_AMBULATORY_CARE_PROVIDER_SITE_OTHER): Admitting: Urgent Care

## 2024-07-27 ENCOUNTER — Ambulatory Visit: Payer: Self-pay

## 2024-07-27 ENCOUNTER — Encounter: Payer: Self-pay | Admitting: Urgent Care

## 2024-07-27 VITALS — BP 138/70 | HR 74 | Ht 69.0 in | Wt 310.0 lb

## 2024-07-27 DIAGNOSIS — L03116 Cellulitis of left lower limb: Secondary | ICD-10-CM | POA: Diagnosis not present

## 2024-07-27 DIAGNOSIS — L84 Corns and callosities: Secondary | ICD-10-CM | POA: Diagnosis not present

## 2024-07-27 MED ORDER — CEPHALEXIN 500 MG PO CAPS: 500.0000 mg | ORAL_CAPSULE | Freq: Four times a day (QID) | ORAL | 0 refills | Status: AC

## 2024-07-27 MED ORDER — UREA 41 % EX CREA: 1.0000 | TOPICAL_CREAM | Freq: Every day | CUTANEOUS | 0 refills | Status: AC

## 2024-07-27 NOTE — Telephone Encounter (Signed)
 FYI Only or Action Required?: Action required by provider: request for appointment.  Patient was last seen in primary care on 05/24/2024 by Alvan Dorothyann BIRCH, MD.  Called Nurse Triage reporting foot pain.  Symptoms began several days ago.  Interventions attempted: Nothing.  Symptoms are: unchanged.  Triage Disposition: See PCP When Office is Open (Within 3 Days)  Patient/caregiver understands and will follow disposition?: Yes    Copied from CRM #[id number]. Topic: Clinical - Red Word Triage >> Jul 27, 2024  8:10 AM Tobias L wrote: Red Word that prompted transfer to Nurse Triage: pain in foot, pain was so bad he didn't want to walk on it, pain is back this morning Answer Assessment - Initial Assessment Questions 1. ONSET: When did the pain start?      2 days ago 2. LOCATION: Where is the pain located?      Left foot, top of foot 3. PAIN: How bad is the pain?    (Scale 1-10; or mild, moderate, severe)     Hurts to walk 4. WORK OR EXERCISE: Has there been any recent work or exercise that involved this part of the body?      no 5. CAUSE: What do you think is causing the foot pain?     unsure 6. OTHER SYMPTOMS: Do you have any other symptoms? (e.g., leg pain, rash, fever, numbness)     no 7. PREGNANCY: Is there any chance you are pregnant? When was your last menstrual period?     N/a  Protocols used: Foot Pain-A-AH  Reason for Disposition  [1] MODERATE pain (e.g., interferes with normal activities, limping) AND [2] present > 3 days  Answer Assessment - Initial Assessment Questions 1. ONSET: When did the pain start?      2 days ago 2. LOCATION: Where is the pain located?      Left foot, top of foot 3. PAIN: How bad is the pain?    (Scale 1-10; or mild, moderate, severe)     Hurts to walk 4. WORK OR EXERCISE: Has there been any recent work or exercise that involved this part of the body?      no 5. CAUSE: What do you think is causing the foot  pain?     unsure 6. OTHER SYMPTOMS: Do you have any other symptoms? (e.g., leg pain, rash, fever, numbness)     no 7. PREGNANCY: Is there any chance you are pregnant? When was your last menstrual period?     N/a  Protocols used: Foot Pain-A-AH

## 2024-07-27 NOTE — Progress Notes (Signed)
 Established Patient Office Visit  Subjective:  Patient ID: Joe Morrison, male    DOB: 10-01-1966  Age: 58 y.o. MRN: [id number]  Chief Complaint  Patient presents with   Foot Pain    Left x 2 days    HPI  Discussed the use of AI scribe software for clinical note transcription with the patient, who gave verbal consent to proceed.  History of Present Illness   Joe Morrison is a 58 year old male who presents with left foot pain.  He woke up yesterday morning with soreness in his left foot, which worsened throughout the day, especially when walking. By mid-afternoon, the pain was significant enough that he went home from work. After resting in the evening, the pain subsided by bedtime, even when walking. This morning, the pain returned upon walking. He describes the pain as 'stinging' and localized to the top left middle of his foot. He denies any known injury or trauma to the area prior to the onset of symptoms. The pain does not radiate and is specific to the same area on his foot.  He has a history of plantar fasciitis in the same foot, but notes that this pain is different as it is located on the top of the foot rather than the bottom. He denies any movement issues, stating that moving his foot slightly can ease the pain. He can fully move his ankle and wiggle his toes without problems. No fever, chills, or any previous diagnosis of gout or cellulitis. He took Advil last night, which seemed to help with the pain.  His current medications include Tikosyn , Eliquis , Entresto , and spironolactone  (12.5 mg daily). He takes torsemide  as needed, typically a couple of times a week depending on his diet. 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       Patient Active Problem List   Diagnosis Date Noted   Ascending aorta dilatation 03/12/2022   Persistent atrial fibrillation (HCC) 02/07/2021   Secondary hypercoagulable state 02/07/2021   OSA (obstructive sleep  apnea) 02/05/2021   Primary insomnia 02/05/2021   Acute combined systolic and diastolic heart failure (HCC) 02/01/2021   Monomorphic ventricular tachycardia (HCC) 02/01/2021   Chronic combined systolic and diastolic heart failure (HCC) 01/29/2021   Aortic regurgitation 01/29/2021   Atrial fibrillation with RVR (HCC) 01/27/2021   Depression, major, single episode, mild 06/24/2020   Severe obesity (BMI >= 40) (HCC) 10/23/2013   ED (erectile dysfunction) 05/02/2012   Morbid obesity (HCC) 02/13/2010   HYPERTENSION, BENIGN SYSTEMIC 07/20/2006   Past Medical History:  Diagnosis Date   Alcoholic (HCC)    recovering since 10/1999   Ascending aorta dilatation    42mm in ascending aorta by CTA 03/2022   Combined systolic and diastolic congestive heart failure (HCC)    a. Echo 4/22 LVEF 35-40%, unable assess RWMA and diastolic function, RV normal, LA severely dilated,  mod RAE, mild dilatation of the aortic root, measuring 46 mm, mild dilatation of the ascending aorta, 41 mm; b. Echo 7/22 improved EF of 45 to 50% up from 35 to 40%, apical hypokinesis, severe LAE, moderate RAE, moderate AV regurgitation, mod dilatation of the ascendig aorta, 42 mm.   Former tobacco use    Hypertension    Morbid obesity (HCC)    OSA (obstructive sleep apnea)    Persistent atrial fibrillation (HCC)    S/p failed DCCV 4/22, on Tikosyn  c/ restoration of NSR, Eliquis .   Severe  aortic regurgitation    a. Echo 7/22 c/ mod AR; b. cMRI 9/22 c/ severe AR.   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   Past Surgical History:  Procedure Laterality Date   CARDIOVERSION N/A 01/29/2021   Procedure: CARDIOVERSION;  Surgeon: Delford Maude BROCKS, MD;  Location: [facility] ENDOSCOPY;  Service: Cardiovascular;  Laterality: N/A;   MOUTH SURGERY     Dental work   TEE WITHOUT CARDIOVERSION N/A 01/29/2021   Procedure: TRANSESOPHAGEAL ECHOCARDIOGRAM (TEE);  Surgeon: Delford Maude BROCKS, MD;  Location: [facility] ENDOSCOPY;  Service:  Cardiovascular;  Laterality: N/A;   TONSILLECTOMY  1972   Social History   Tobacco Use   Smoking status: Former    Current packs/day: 0.00    Average packs/day: 2.0 packs/day for 16.0 years (32.0 ttl pk-yrs)    Types: Cigarettes    Start date: 04/27/1983    Quit date: 04/27/1999    Years since quitting: 25.2    Passive exposure: Past   Smokeless tobacco: Never  Vaping Use   Vaping status: Never Used  Substance Use Topics   Alcohol use: Not Currently    Comment: Former Advice worker in 05/7997. former heavy drinker 20beers/night.   Drug use: Not Currently    Types: Marijuana, MDMA (Ecstacy), Oxycodone    Comment: stopped in 2001      ROS: as noted in HPI  Objective:     BP 138/70   Pulse 74   Ht 5' 9 (1.753 m)   Wt (!) 310 lb (140.6 kg)   SpO2 98%   BMI 45.78 kg/m  BP Readings from Last 3 Encounters:  07/27/24 138/70  05/24/24 (!) 114/54  02/04/24 (!) 125/49   Wt Readings from Last 3 Encounters:  07/27/24 (!) 310 lb (140.6 kg)  05/24/24 297 lb 1.3 oz (134.8 kg)  02/04/24 (!) 309 lb (140.2 kg)      Physical Exam Vitals and nursing note reviewed.  Constitutional:      General: He is not in acute distress.    Appearance: Normal appearance. He is not ill-appearing or toxic-appearing.  Cardiovascular:     Rate and Rhythm: Normal rate.     Pulses: Normal pulses.  Pulmonary:     Effort: Pulmonary effort is normal. No respiratory distress.  Musculoskeletal:        General: Tenderness present. No swelling or deformity. Normal range of motion.     Right lower leg: No edema.     Left lower leg: No edema.       Feet:  Skin:    General: Skin is warm and dry.     Capillary Refill: Capillary refill takes less than 2 seconds.     Findings: Erythema present.  Neurological:     General: No focal deficit present.     Mental Status: He is alert and oriented to person, place, and time.     Sensory: No sensory deficit.     Motor: No weakness.     Gait: Gait normal.   Psychiatric:        Mood and Affect: Mood normal.        Behavior: Behavior normal.      No results found for any visits on 07/27/24.  Last CBC Lab Results  Component Value Date   WBC 7.8 06/29/2023   HGB 14.1 06/29/2023   HCT 42.2 06/29/2023   MCV 99 (H) 06/29/2023   MCH 32.9 06/29/2023   RDW 13.0 06/29/2023   PLT 208 06/29/2023  Last metabolic panel Lab Results  Component Value Date   GLUCOSE 92 06/29/2023   NA 136 06/29/2023   K 5.5 (H) 06/29/2023   CL 101 06/29/2023   CO2 20 06/29/2023   BUN 29 (H) 06/29/2023   CREATININE 1.41 (H) 06/29/2023   EGFR 57 (L) 06/29/2023   CALCIUM  9.6 06/29/2023   PROT 6.9 06/29/2023   ALBUMIN 4.3 06/29/2023   LABGLOB 2.6 06/29/2023   BILITOT 0.4 06/29/2023   ALKPHOS 48 06/29/2023   AST 88 (H) 06/29/2023   ALT 35 06/29/2023   ANIONGAP 10 02/21/2022   Last lipids Lab Results  Component Value Date   CHOL 133 05/06/2023   HDL 28 (L) 05/06/2023   LDLCALC 59 05/06/2023   TRIG 293 (H) 05/06/2023   CHOLHDL 4.8 05/06/2023   Last hemoglobin A1c Lab Results  Component Value Date   HGBA1C 5.3 06/29/2023      The 10-year ASCVD risk score (Arnett DK, et al., 2019) is: 11.9%  Assessment & Plan:  Cellulitis of left foot -     Cephalexin; Take 1 capsule (500 mg total) by mouth 4 (four) times daily for 7 days.  Dispense: 28 capsule; Refill: 0  Corn of foot -     Urea; Apply 1 application  topically daily.  Dispense: 227 g; Refill: 0  Assessment and Plan    Cellulitis of left foot Acute stinging pain and redness on the dorsal left foot, warm to touch, indicating cellulitis. Differential includes atypical gout. No systemic symptoms or diabetes. Callus/ fisures may have contributed to development of infection - start cephalexin QID - draw line around it, contact office if spreading  Corn/ callous/ dry fissuring skin - topical urea to help with fissures and dryness        No follow-ups on file.   Benton LITTIE Gave, PA

## 2024-07-27 NOTE — Patient Instructions (Signed)
  I have called in topical urea cream. Please apply this to the hard, dry areas of your foot daily until resolved. Stop if irritation occurs. If the urea cream is no covered, you can buy the above product over the counter.  Please take cephalexin 4 times daily x 7 days. Draw a line around the red spot and ensure it does not spread. Read the attached handout regarding this condition.  Return in 4-5 days if no significant improvement noted.

## 2024-09-05 ENCOUNTER — Other Ambulatory Visit (HOSPITAL_BASED_OUTPATIENT_CLINIC_OR_DEPARTMENT_OTHER): Payer: Self-pay | Admitting: Family

## 2024-09-11 ENCOUNTER — Encounter (HOSPITAL_BASED_OUTPATIENT_CLINIC_OR_DEPARTMENT_OTHER): Payer: Self-pay

## 2024-09-17 ENCOUNTER — Other Ambulatory Visit: Payer: Self-pay | Admitting: Family

## 2024-09-17 DIAGNOSIS — I5042 Chronic combined systolic (congestive) and diastolic (congestive) heart failure: Secondary | ICD-10-CM

## 2024-09-17 DIAGNOSIS — I4819 Other persistent atrial fibrillation: Secondary | ICD-10-CM

## 2024-09-17 DIAGNOSIS — I10 Essential (primary) hypertension: Secondary | ICD-10-CM

## 2024-09-17 DIAGNOSIS — Z79899 Other long term (current) drug therapy: Secondary | ICD-10-CM

## 2024-09-20 ENCOUNTER — Encounter (HOSPITAL_BASED_OUTPATIENT_CLINIC_OR_DEPARTMENT_OTHER): Payer: Self-pay | Admitting: Cardiology

## 2024-09-20 ENCOUNTER — Ambulatory Visit (INDEPENDENT_AMBULATORY_CARE_PROVIDER_SITE_OTHER): Admitting: Cardiology

## 2024-09-20 DIAGNOSIS — Z7901 Long term (current) use of anticoagulants: Secondary | ICD-10-CM | POA: Diagnosis not present

## 2024-09-20 DIAGNOSIS — I77810 Thoracic aortic ectasia: Secondary | ICD-10-CM | POA: Diagnosis not present

## 2024-09-20 DIAGNOSIS — Z79899 Other long term (current) drug therapy: Secondary | ICD-10-CM | POA: Diagnosis not present

## 2024-09-20 DIAGNOSIS — E782 Mixed hyperlipidemia: Secondary | ICD-10-CM | POA: Diagnosis not present

## 2024-09-20 DIAGNOSIS — D6869 Other thrombophilia: Secondary | ICD-10-CM | POA: Diagnosis not present

## 2024-09-20 DIAGNOSIS — I480 Paroxysmal atrial fibrillation: Secondary | ICD-10-CM | POA: Diagnosis not present

## 2024-09-20 DIAGNOSIS — I351 Nonrheumatic aortic (valve) insufficiency: Secondary | ICD-10-CM | POA: Diagnosis not present

## 2024-09-20 DIAGNOSIS — D6859 Other primary thrombophilia: Secondary | ICD-10-CM

## 2024-09-20 DIAGNOSIS — I5020 Unspecified systolic (congestive) heart failure: Secondary | ICD-10-CM | POA: Diagnosis not present

## 2024-09-20 LAB — CBC
Hematocrit: 42.1 % (ref 37.5–51.0)
Hemoglobin: 15.2 g/dL (ref 13.0–17.7)
MCH: 33.7 pg — ABNORMAL HIGH (ref 26.6–33.0)
MCHC: 36.1 g/dL — ABNORMAL HIGH (ref 31.5–35.7)
MCV: 93 fL (ref 79–97)
Platelets: 181 x10E3/uL (ref 150–450)
RBC: 4.51 x10E6/uL (ref 4.14–5.80)
RDW: 13.1 % (ref 11.6–15.4)
WBC: 6.1 x10E3/uL (ref 3.4–10.8)

## 2024-09-20 LAB — COMPREHENSIVE METABOLIC PANEL WITH GFR
ALT: 21 IU/L (ref 0–44)
AST: 23 IU/L (ref 0–40)
Albumin: 4.3 g/dL (ref 3.8–4.9)
Alkaline Phosphatase: 43 IU/L — ABNORMAL LOW (ref 47–123)
BUN/Creatinine Ratio: 19 (ref 10–24)
BUN: 25 mg/dL (ref 8–27)
Bilirubin Total: 0.4 mg/dL (ref 0.0–1.2)
CO2: 18 mmol/L — ABNORMAL LOW (ref 20–29)
Calcium: 9.4 mg/dL (ref 8.6–10.2)
Chloride: 105 mmol/L (ref 96–106)
Creatinine, Ser: 1.31 mg/dL — ABNORMAL HIGH (ref 0.76–1.27)
Globulin, Total: 2.2 g/dL (ref 1.5–4.5)
Glucose: 87 mg/dL (ref 70–99)
Potassium: 4.9 mmol/L (ref 3.5–5.2)
Sodium: 137 mmol/L (ref 134–144)
Total Protein: 6.5 g/dL (ref 6.0–8.5)
eGFR: 62 mL/min/1.73 (ref >59)

## 2024-09-20 LAB — LIPID PANEL
Chol/HDL Ratio: 3.9 ratio (ref 0.0–5.0)
Cholesterol, Total: 126 mg/dL (ref 100–199)
HDL: 32 mg/dL — ABNORMAL LOW (ref >39)
LDL Chol Calc (NIH): 60 mg/dL (ref 0–99)
Triglycerides: 203 mg/dL — ABNORMAL HIGH (ref 0–149)
VLDL Cholesterol Cal: 34 mg/dL (ref 5–40)

## 2024-09-20 LAB — MAGNESIUM: Magnesium: 2.3 mg/dL (ref 1.6–2.3)

## 2024-09-20 MED ORDER — SACUBITRIL-VALSARTAN 97-103 MG PO TABS: 1.0000 | ORAL_TABLET | Freq: Two times a day (BID) | ORAL | 3 refills | Status: AC

## 2024-09-20 MED ORDER — METOPROLOL SUCCINATE ER 100 MG PO TB24: 100.0000 mg | ORAL_TABLET | Freq: Every day | ORAL | 3 refills | Status: AC

## 2024-09-20 MED ORDER — SPIRONOLACTONE 25 MG PO TABS: 12.5000 mg | ORAL_TABLET | Freq: Every day | ORAL | 3 refills | Status: AC

## 2024-09-20 MED ORDER — APIXABAN 5 MG PO TABS: 5.0000 mg | ORAL_TABLET | Freq: Two times a day (BID) | ORAL | 3 refills | Status: AC

## 2024-09-20 MED ORDER — ROSUVASTATIN CALCIUM 20 MG PO TABS: 20.0000 mg | ORAL_TABLET | Freq: Every day | ORAL | 3 refills | Status: AC

## 2024-09-20 NOTE — Patient Instructions (Signed)
 Medication Instructions:  No changes *If you need a refill on your cardiac medications before your next appointment, please call your pharmacy*  Lab Work: Today, go to 3rd floor for blood work!   Testing/Procedures: Your physician has requested that you have an echocardiogram. Echocardiography is a painless test that uses sound waves to create images of your heart. It provides your doctor with information about the size and shape of your heart and how well your hearts chambers and valves are working. This procedure takes approximately one hour. There are no restrictions for this procedure. Please do NOT wear cologne, perfume, aftershave, or lotions (deodorant is allowed). Please arrive 15 minutes prior to your appointment time.  Please note: We ask at that you not bring children with you during ultrasound (echo/ vascular) testing. Due to room size and safety concerns, children are not allowed in the ultrasound rooms during exams. Our front office staff cannot provide observation of children in our lobby area while testing is being conducted. An adult accompanying a patient to their appointment will only be allowed in the ultrasound room at the discretion of the ultrasound technician under special circumstances. We apologize for any inconvenience.   Follow-Up: At [facility], you and your health needs are our priority.  As part of our continuing mission to provide you with exceptional heart care, our providers are all part of one team.  This team includes your primary Cardiologist (physician) and Advanced Practice Providers or APPs (Physician Assistants and Nurse Practitioners) who all work together to provide you with the care you need, when you need it.  Your next appointment:   6 month(s)  Provider:   Shelda Bruckner, MD or Reche Finder, NP

## 2024-09-20 NOTE — Progress Notes (Signed)
 Cardiology Office Note:  .   Date:  09/20/2024  ID:  Joe Morrison, DOB [birth year], MRN [id number] PCP: Alvan Dorothyann BIRCH, MD  [facility] Providers Cardiologist:  Shelda Bruckner, MD Electrophysiologist:  Danelle Birmingham, MD {  History of Present Illness: .   Joe Morrison is a 58 y.o. male with PMH atrial fibrillation, prior systolic and diastolic heart failure now with recovered EF, aortic regurgitation, recovered alcoholic. He was previously followed by Dr. Hobart, and he established care with me on 09/20/24.  Pertinent CV history: Admitted 01/2021, new diagnosis of afib RVR and new systolic and diastolic heart failure. Cardioversion did not hold, started on tikosyn . Initial EF 35-40%. Follow up echo with EF 45-50% but at least moderate AR and mild ascending aorta dilation. Cardiac MRI showed EF 54%, severe AR, aortic root dilation of 46 mm. Seen by Dr. Lucas, recommended surveillance.   Most recent echo 08/10/2023 EF 60-65%, moderate to severe AR with tricuspid valve, diastolic flow reversal, aortic root 44 mm, ascending aorta 43 mm. LVEDD 4.10 vm, LVESD 2.60 cm.  Today: Has not felt any afib recently, though unsure what his symptoms would feel like. Reviewed his aortic regurgitation, what we watch for. His physical activity varies with his weight, but no specific limitations. 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   Struggles with his weight. 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 We discussed GLP, lifestyle at length.  BP typically elevated initially in the office, improves on recheck typically.   ROS: Denies concerning chest pain, shortness of breath at rest or with normal exertion. No PND, orthopnea, or unexpected weight gain. No syncope or palpitations. ROS otherwise negative except as noted.   Studies Reviewed: SABRA    EKG:       Physical Exam:   VS:  BP  132/74   Pulse 77   Ht 5' 9 (1.753 m)   Wt (!) 324 lb 11.2 oz (147.3 kg)   SpO2 97%   BMI 47.95 kg/m    Wt Readings from Last 3 Encounters:  09/20/24 (!) 324 lb 11.2 oz (147.3 kg)  07/27/24 (!) 310 lb (140.6 kg)  05/24/24 297 lb 1.3 oz (134.8 kg)    GEN: Well nourished, well developed in no acute distress HEENT: Normal, moist mucous membranes NECK: No JVD CARDIAC: regular rhythm, normal S1 and S2, no rubs or gallops. No murmur. VASCULAR: Radial and DP pulses 2+ bilaterally. No carotid bruits RESPIRATORY:  Clear to auscultation without rales, wheezing or rhonchi  ABDOMEN: Soft, non-tender, non-distended MUSCULOSKELETAL:  Ambulates independently SKIN: Warm and dry, no edema NEUROLOGIC:  Alert and oriented x 3. No focal neuro deficits noted. PSYCHIATRIC:  Normal affect    ASSESSMENT AND PLAN: .    Severe aortic regurgitation Aortic dilation -due for repeat imaging, will get echo  -reviewed signs/symptoms to watch for -previously seen by Dr. Lucas  Heart failure with recovered EF Hypertension -NYHA I-II -takes torsemide  PRN, about 2-3 times/week -continue farxiga , metoprolol , entresto , spironolactone   Paroxysmal atrial fibrillation High risk medication use (tikosyn ) Secondary hypercoagulable state -CHA2DS2/VAS Stroke Risk Points=  2  -due for monitoring labs for tikosyn , he is fasting, will get today -renewed medications today  Obesity, BMI ~48 OSA on CPAP -discussed GLP at length today  Mixed hyperlipidemia -due for labs, fasting today -continue rosuvastatin , ezetimibe   CV risk counseling and prevention -recommend heart healthy/Mediterranean diet, with  whole grains, fruits, vegetable, fish, lean meats, nuts, and olive oil. Limit salt. -recommend moderate walking, 3-5 times/week for 30-50 minutes each session. Aim for at least 150 minutes/week. Goal should be pace of 3 miles/hours, or walking 1.5 miles in 30 minutes -recommend avoidance of tobacco products. Avoid  excess alcohol. -ASCVD risk score: The 10-year ASCVD risk score (Arnett DK, et al., 2019) is: 11.1%   Values used to calculate the score:     Age: 14 years     Clincally relevant sex: Male     Is Non-Hispanic African American: No     Diabetic: No     Tobacco smoker: No     Systolic Blood Pressure: 132 mmHg     Is BP treated: Yes     HDL Cholesterol: 28 mg/dL     Total Cholesterol: 133 mg/dL    Dispo: 6 mos or sooner as needed  Signed, Shelda Bruckner, MD   Shelda Bruckner, MD, PhD, [facility]  [facility]  [place]  [facility] at [facility] [address], [address] [place], [place] [address] [phone]

## 2024-10-06 ENCOUNTER — Other Ambulatory Visit (HOSPITAL_BASED_OUTPATIENT_CLINIC_OR_DEPARTMENT_OTHER): Payer: Self-pay | Admitting: Family

## 2024-10-06 DIAGNOSIS — Z79899 Other long term (current) drug therapy: Secondary | ICD-10-CM

## 2024-10-06 DIAGNOSIS — E782 Mixed hyperlipidemia: Secondary | ICD-10-CM

## 2024-10-06 DIAGNOSIS — I5020 Unspecified systolic (congestive) heart failure: Secondary | ICD-10-CM

## 2024-10-30 ENCOUNTER — Ambulatory Visit (HOSPITAL_BASED_OUTPATIENT_CLINIC_OR_DEPARTMENT_OTHER): Payer: Self-pay | Admitting: Cardiology

## 2024-10-30 ENCOUNTER — Other Ambulatory Visit (HOSPITAL_BASED_OUTPATIENT_CLINIC_OR_DEPARTMENT_OTHER)

## 2024-11-16 ENCOUNTER — Other Ambulatory Visit (HOSPITAL_BASED_OUTPATIENT_CLINIC_OR_DEPARTMENT_OTHER)

## 2024-12-14 ENCOUNTER — Other Ambulatory Visit (HOSPITAL_BASED_OUTPATIENT_CLINIC_OR_DEPARTMENT_OTHER)

## 2025-05-28 ENCOUNTER — Encounter: Admitting: Family Medicine
